# Patient Record
Sex: Female | Born: 1950 | Race: White | Hispanic: No | Marital: Single | Smoking: Never smoker
Health system: Southern US, Community
[De-identification: ages and names within clinical notes are randomized; demographics above are authoritative.]

## PROBLEM LIST (undated history)

## (undated) DIAGNOSIS — I352 Nonrheumatic aortic (valve) stenosis with insufficiency: Secondary | ICD-10-CM

## (undated) DIAGNOSIS — M1990 Unspecified osteoarthritis, unspecified site: Secondary | ICD-10-CM

## (undated) DIAGNOSIS — I471 Supraventricular tachycardia, unspecified: Secondary | ICD-10-CM

## (undated) DIAGNOSIS — I4891 Unspecified atrial fibrillation: Secondary | ICD-10-CM

## (undated) DIAGNOSIS — I5033 Acute on chronic diastolic (congestive) heart failure: Secondary | ICD-10-CM

## (undated) DIAGNOSIS — R238 Other skin changes: Secondary | ICD-10-CM

## (undated) DIAGNOSIS — I10 Essential (primary) hypertension: Secondary | ICD-10-CM

## (undated) DIAGNOSIS — I340 Nonrheumatic mitral (valve) insufficiency: Secondary | ICD-10-CM

## (undated) HISTORY — DX: Unspecified osteoarthritis, unspecified site: M19.90

## (undated) HISTORY — DX: Supraventricular tachycardia, unspecified: I47.10

## (undated) HISTORY — DX: Nonrheumatic mitral (valve) insufficiency: I34.0

## (undated) HISTORY — DX: Essential (primary) hypertension: I10

## (undated) HISTORY — DX: Supraventricular tachycardia: I47.1

## (undated) HISTORY — PX: OTHER SURGICAL HISTORY: SHX169

## (undated) HISTORY — DX: Nonrheumatic aortic (valve) stenosis with insufficiency: I35.2

## (undated) HISTORY — DX: Unspecified atrial fibrillation: I48.91

## (undated) HISTORY — PX: TONSILLECTOMY: SUR1361

---

## 2011-03-27 ENCOUNTER — Inpatient Hospital Stay (HOSPITAL_COMMUNITY)
Admission: AD | Admit: 2011-03-27 | Discharge: 2011-04-05 | DRG: 217 | Disposition: A | Discharge status: Home Health | Payer: Medicaid Other | Source: Other Acute Inpatient Hospital | Attending: Thoracic Surgery (Cardiothoracic Vascular Surgery) | Admitting: Thoracic Surgery (Cardiothoracic Vascular Surgery)

## 2011-03-27 DIAGNOSIS — D62 Acute posthemorrhagic anemia: Secondary | ICD-10-CM | POA: Diagnosis not present

## 2011-03-27 DIAGNOSIS — I252 Old myocardial infarction: Secondary | ICD-10-CM

## 2011-03-27 DIAGNOSIS — R7989 Other specified abnormal findings of blood chemistry: Secondary | ICD-10-CM

## 2011-03-27 DIAGNOSIS — I080 Rheumatic disorders of both mitral and aortic valves: Principal | ICD-10-CM | POA: Diagnosis present

## 2011-03-27 DIAGNOSIS — Y838 Other surgical procedures as the cause of abnormal reaction of the patient, or of later complication, without mention of misadventure at the time of the procedure: Secondary | ICD-10-CM | POA: Diagnosis not present

## 2011-03-27 DIAGNOSIS — R079 Chest pain, unspecified: Secondary | ICD-10-CM

## 2011-03-27 DIAGNOSIS — E8779 Other fluid overload: Secondary | ICD-10-CM | POA: Diagnosis not present

## 2011-03-27 DIAGNOSIS — Z8249 Family history of ischemic heart disease and other diseases of the circulatory system: Secondary | ICD-10-CM

## 2011-03-27 DIAGNOSIS — Y9210 Unspecified residential institution as the place of occurrence of the external cause: Secondary | ICD-10-CM | POA: Diagnosis not present

## 2011-03-27 DIAGNOSIS — I2789 Other specified pulmonary heart diseases: Secondary | ICD-10-CM | POA: Diagnosis present

## 2011-03-27 DIAGNOSIS — Z7982 Long term (current) use of aspirin: Secondary | ICD-10-CM

## 2011-03-27 DIAGNOSIS — R0602 Shortness of breath: Secondary | ICD-10-CM

## 2011-03-27 DIAGNOSIS — I519 Heart disease, unspecified: Secondary | ICD-10-CM | POA: Diagnosis not present

## 2011-03-27 DIAGNOSIS — E669 Obesity, unspecified: Secondary | ICD-10-CM | POA: Diagnosis present

## 2011-03-27 DIAGNOSIS — I10 Essential (primary) hypertension: Secondary | ICD-10-CM | POA: Diagnosis present

## 2011-03-27 DIAGNOSIS — Z79899 Other long term (current) drug therapy: Secondary | ICD-10-CM

## 2011-03-27 DIAGNOSIS — I4891 Unspecified atrial fibrillation: Secondary | ICD-10-CM | POA: Diagnosis not present

## 2011-03-27 DIAGNOSIS — Z91038 Other insect allergy status: Secondary | ICD-10-CM

## 2011-03-27 LAB — COMPREHENSIVE METABOLIC PANEL
AST: 22 U/L (ref 0–37)
Albumin: 3.6 g/dL (ref 3.5–5.2)
Alkaline Phosphatase: 75 U/L (ref 39–117)
BUN: 19 mg/dL (ref 6–23)
CO2: 27 mEq/L (ref 19–32)
Chloride: 100 mEq/L (ref 96–112)
Creatinine, Ser: 0.85 mg/dL (ref 0.50–1.10)
GFR calc non Af Amer: 74 mL/min — ABNORMAL LOW (ref >90)
Potassium: 3.7 mEq/L (ref 3.5–5.1)
Total Bilirubin: 0.7 mg/dL (ref 0.3–1.2)

## 2011-03-27 LAB — CBC
HCT: 33.1 % — ABNORMAL LOW (ref 36.0–46.0)
MCV: 82.3 fL (ref 78.0–100.0)
RBC: 4.02 MIL/uL (ref 3.87–5.11)
RDW: 13.8 % (ref 11.5–15.5)
WBC: 5.9 10*3/uL (ref 4.0–10.5)

## 2011-03-27 LAB — DIFFERENTIAL
Basophils Absolute: 0 10*3/uL (ref 0.0–0.1)
Eosinophils Relative: 2 % (ref 0–5)
Lymphocytes Relative: 25 % (ref 12–46)
Lymphs Abs: 1.5 10*3/uL (ref 0.7–4.0)
Neutro Abs: 4 10*3/uL (ref 1.7–7.7)
Neutrophils Relative %: 67 % (ref 43–77)

## 2011-03-27 LAB — PROTIME-INR: INR: 1.12 (ref 0.00–1.49)

## 2011-03-27 LAB — TSH: TSH: 0.82 u[IU]/mL (ref 0.350–4.500)

## 2011-03-27 LAB — CARDIAC PANEL(CRET KIN+CKTOT+MB+TROPI): Relative Index: INVALID (ref 0.0–2.5)

## 2011-03-27 LAB — PRO B NATRIURETIC PEPTIDE: Pro B Natriuretic peptide (BNP): 3675 pg/mL — ABNORMAL HIGH (ref 0–125)

## 2011-03-27 LAB — MRSA PCR SCREENING: MRSA by PCR: POSITIVE — AB

## 2011-03-28 ENCOUNTER — Inpatient Hospital Stay (HOSPITAL_COMMUNITY): Payer: Medicaid Other

## 2011-03-28 DIAGNOSIS — I359 Nonrheumatic aortic valve disorder, unspecified: Secondary | ICD-10-CM

## 2011-03-28 DIAGNOSIS — I059 Rheumatic mitral valve disease, unspecified: Secondary | ICD-10-CM

## 2011-03-28 DIAGNOSIS — Z01810 Encounter for preprocedural cardiovascular examination: Secondary | ICD-10-CM

## 2011-03-28 LAB — CBC
MCHC: 32.9 g/dL (ref 30.0–36.0)
Platelets: 280 10*3/uL (ref 150–400)
RDW: 13.8 % (ref 11.5–15.5)
WBC: 5.1 10*3/uL (ref 4.0–10.5)

## 2011-03-28 LAB — POCT I-STAT 3, VENOUS BLOOD GAS (G3P V)
Acid-Base Excess: 2 mmol/L (ref 0.0–2.0)
Bicarbonate: 27.2 mEq/L — ABNORMAL HIGH (ref 20.0–24.0)
O2 Saturation: 57 %
TCO2: 29 mmol/L (ref 0–100)
pO2, Ven: 31 mmHg (ref 30.0–45.0)

## 2011-03-28 LAB — POCT I-STAT 3, ART BLOOD GAS (G3+)
Acid-Base Excess: 2 mmol/L (ref 0.0–2.0)
O2 Saturation: 91 %
TCO2: 28 mmol/L (ref 0–100)
pCO2 arterial: 42.1 mmHg (ref 35.0–45.0)

## 2011-03-28 LAB — DIFFERENTIAL
Basophils Absolute: 0 10*3/uL (ref 0.0–0.1)
Basophils Relative: 1 % (ref 0–1)
Eosinophils Absolute: 0.2 10*3/uL (ref 0.0–0.7)
Eosinophils Relative: 4 % (ref 0–5)
Monocytes Absolute: 0.5 10*3/uL (ref 0.1–1.0)

## 2011-03-28 LAB — PULMONARY FUNCTION TEST

## 2011-03-28 LAB — CARDIAC PANEL(CRET KIN+CKTOT+MB+TROPI): Total CK: 99 U/L (ref 7–177)

## 2011-03-28 LAB — HEPARIN LEVEL (UNFRACTIONATED)
Heparin Unfractionated: <0.1 IU/mL — ABNORMAL LOW (ref 0.30–0.70)
Heparin Unfractionated: 0.23 IU/mL — ABNORMAL LOW (ref 0.30–0.70)

## 2011-03-28 LAB — POCT ACTIVATED CLOTTING TIME: Activated Clotting Time: 111 seconds

## 2011-03-29 DIAGNOSIS — K08109 Complete loss of teeth, unspecified cause, unspecified class: Secondary | ICD-10-CM

## 2011-03-29 DIAGNOSIS — M2607 Excessive tuberosity of jaw: Secondary | ICD-10-CM

## 2011-03-29 LAB — CBC
HCT: 34.1 % — ABNORMAL LOW (ref 36.0–46.0)
Hemoglobin: 11 g/dL — ABNORMAL LOW (ref 12.0–15.0)
MCH: 27.2 pg (ref 26.0–34.0)
MCHC: 32.3 g/dL (ref 30.0–36.0)

## 2011-03-29 LAB — URINALYSIS, ROUTINE W REFLEX MICROSCOPIC
Glucose, UA: NEGATIVE mg/dL
Protein, ur: NEGATIVE mg/dL
Specific Gravity, Urine: 1.044 — ABNORMAL HIGH (ref 1.005–1.030)
pH: 6 (ref 5.0–8.0)

## 2011-03-29 LAB — BLOOD GAS, ARTERIAL
Acid-Base Excess: 3.8 mmol/L — ABNORMAL HIGH (ref 0.0–2.0)
O2 Saturation: 98.9 %
TCO2: 29.2 mmol/L (ref 0–100)
pCO2 arterial: 42.8 mmHg (ref 35.0–45.0)

## 2011-03-29 LAB — BASIC METABOLIC PANEL
Calcium: 9.1 mg/dL (ref 8.4–10.5)
GFR calc Af Amer: >90 mL/min (ref >90)
GFR calc non Af Amer: 80 mL/min — ABNORMAL LOW (ref >90)
Glucose, Bld: 98 mg/dL (ref 70–99)
Sodium: 141 mEq/L (ref 135–145)

## 2011-03-29 LAB — HEMOGLOBIN A1C: Mean Plasma Glucose: 117 mg/dL — ABNORMAL HIGH (ref <117)

## 2011-03-29 LAB — URINE MICROSCOPIC-ADD ON

## 2011-03-30 ENCOUNTER — Other Ambulatory Visit: Payer: Self-pay | Admitting: Thoracic Surgery (Cardiothoracic Vascular Surgery)

## 2011-03-30 ENCOUNTER — Inpatient Hospital Stay (HOSPITAL_COMMUNITY): Payer: Medicaid Other

## 2011-03-30 DIAGNOSIS — I359 Nonrheumatic aortic valve disorder, unspecified: Secondary | ICD-10-CM

## 2011-03-30 DIAGNOSIS — Z952 Presence of prosthetic heart valve: Secondary | ICD-10-CM | POA: Insufficient documentation

## 2011-03-30 HISTORY — PX: AORTIC VALVE REPLACEMENT: SHX41

## 2011-03-30 LAB — POCT I-STAT 3, ART BLOOD GAS (G3+)
Bicarbonate: 24.9 mEq/L — ABNORMAL HIGH (ref 20.0–24.0)
O2 Saturation: 100 %
O2 Saturation: 80 %
Patient temperature: 35.3
TCO2: 25 mmol/L (ref 0–100)
TCO2: 28 mmol/L (ref 0–100)
pCO2 arterial: 45.6 mmHg — ABNORMAL HIGH (ref 35.0–45.0)
pCO2 arterial: 35.3 mmHg (ref 35.0–45.0)
pCO2 arterial: 35.4 mmHg (ref 35.0–45.0)
pH, Arterial: 7.427 — ABNORMAL HIGH (ref 7.350–7.400)
pH, Arterial: 7.376 (ref 7.350–7.400)
pH, Arterial: 7.326 — ABNORMAL LOW (ref 7.350–7.400)
pO2, Arterial: 350 mmHg — ABNORMAL HIGH (ref 80.0–100.0)

## 2011-03-30 LAB — CBC
HCT: 34.3 % — ABNORMAL LOW (ref 36.0–46.0)
Hemoglobin: 11 g/dL — ABNORMAL LOW (ref 12.0–15.0)
Hemoglobin: 8.1 g/dL — ABNORMAL LOW (ref 12.0–15.0)
MCH: 26.8 pg (ref 26.0–34.0)
MCH: 27.5 pg (ref 26.0–34.0)
MCHC: 32.1 g/dL (ref 30.0–36.0)
Platelets: 142 10*3/uL — ABNORMAL LOW (ref 150–400)
Platelets: 158 10*3/uL (ref 150–400)
RBC: 2.97 MIL/uL — ABNORMAL LOW (ref 3.87–5.11)
RBC: 3.45 MIL/uL — ABNORMAL LOW (ref 3.87–5.11)
WBC: 6 10*3/uL (ref 4.0–10.5)
WBC: 8 10*3/uL (ref 4.0–10.5)

## 2011-03-30 LAB — POCT I-STAT 4, (NA,K, GLUC, HGB,HCT)
Glucose, Bld: 102 mg/dL — ABNORMAL HIGH (ref 70–99)
Glucose, Bld: 129 mg/dL — ABNORMAL HIGH (ref 70–99)
Glucose, Bld: 124 mg/dL — ABNORMAL HIGH (ref 70–99)
Glucose, Bld: 97 mg/dL (ref 70–99)
HCT: 32 % — ABNORMAL LOW (ref 36.0–46.0)
HCT: 25 % — ABNORMAL LOW (ref 36.0–46.0)
HCT: 28 % — ABNORMAL LOW (ref 36.0–46.0)
HCT: 23 % — ABNORMAL LOW (ref 36.0–46.0)
HCT: 26 % — ABNORMAL LOW (ref 36.0–46.0)
Hemoglobin: 9.5 g/dL — ABNORMAL LOW (ref 12.0–15.0)
Hemoglobin: 7.8 g/dL — ABNORMAL LOW (ref 12.0–15.0)
Hemoglobin: 8.8 g/dL — ABNORMAL LOW (ref 12.0–15.0)
Potassium: 4.1 mEq/L (ref 3.5–5.1)
Potassium: 5.1 mEq/L (ref 3.5–5.1)
Potassium: 4 mEq/L (ref 3.5–5.1)
Sodium: 137 mEq/L (ref 135–145)
Sodium: 137 mEq/L (ref 135–145)

## 2011-03-30 LAB — APTT: aPTT: 36 seconds (ref 24–37)

## 2011-03-30 LAB — DIFFERENTIAL
Basophils Relative: 1 % (ref 0–1)
Monocytes Absolute: 0.3 10*3/uL (ref 0.1–1.0)
Monocytes Relative: 6 % (ref 3–12)
Neutro Abs: 3.4 10*3/uL (ref 1.7–7.7)

## 2011-03-30 LAB — BASIC METABOLIC PANEL
BUN: 24 mg/dL — ABNORMAL HIGH (ref 6–23)
Calcium: 9.3 mg/dL (ref 8.4–10.5)
GFR calc non Af Amer: 75 mL/min — ABNORMAL LOW (ref >90)
Glucose, Bld: 100 mg/dL — ABNORMAL HIGH (ref 70–99)
Sodium: 137 mEq/L (ref 135–145)

## 2011-03-30 LAB — CREATININE, SERUM
Creatinine, Ser: 0.64 mg/dL (ref 0.50–1.10)
GFR calc Af Amer: >90 mL/min (ref >90)
GFR calc non Af Amer: >90 mL/min (ref >90)

## 2011-03-30 LAB — PROTIME-INR
INR: 1.57 — ABNORMAL HIGH (ref 0.00–1.49)
Prothrombin Time: 19.1 seconds — ABNORMAL HIGH (ref 11.6–15.2)

## 2011-03-30 LAB — PREPARE RBC (CROSSMATCH)

## 2011-03-30 LAB — MAGNESIUM: Magnesium: 3.1 mg/dL — ABNORMAL HIGH (ref 1.5–2.5)

## 2011-03-30 NOTE — Consult Note (Signed)
NAMEKEVIONNA, HEFFLER               ACCOUNT NO.:  [id number]  MEDICAL RECORD NO.:  [id number]  LOCATION:                                 FACILITY:  PHYSICIAN:  Salvatore Decent. Cornelius Moras, M.D. DATE OF BIRTH:  Jun 08, 1951  DATE OF CONSULTATION:  03/28/2011 DATE OF DISCHARGE:                                CONSULTATION   REQUESTING PHYSICIAN:  Dr. Peter Swaziland.  REASON FOR CONSULTATION:  Severe aortic stenosis and aortic insufficiency with mitral regurgitation.  HISTORY OF PRESENT ILLNESS:  The patient is a 60 year old obese white female, who recently moved to [facility], [place].  The patient has known history of valvular heart disease dating back 23 years ago when she reportedly suffered a myocardial infarction.  At that time, she lived in [place].  She was noted to have a heart murmur on exam and was told that she had valvular heart disease.  Since then, she has had followup in a variety different locations with intermittent echocardiograms.  Over the years, she has been told that she may ultimately need surgical intervention.  Her past medical history is, otherwise, notable for history of hypertension and some type of tachycardia that is not clearly defined.  According to the patient's mother, approximately 1 week ago, she began to experience more worsening symptoms of shortness of breath.  She then developed fairly acute onset of tachycardia associated with substernal chest discomfort.  She was seen in the emergency department at [facility] where troponin levels were abnormal prompting transfer to [facility] for further management.  She did have an echocardiogram performed in [facility] demonstrating the presence of moderate to severe aortic stenosis with moderate aortic insufficiency and at least mild mitral regurgitation.  She was admitted to the hospital here and underwent left and right heart catheterization earlier today by Dr. Swaziland.  She was found to  have severe aortic stenosis with moderate-to-severe pulmonary hypertension.  The patient does not have any coronary artery disease. Cardiothoracic surgical consultation was requested.  REVIEW OF SYSTEMS:  The patient is a poor historian.  Review of systems is partly through the patient as well as partly through telephone interview with her mother who has been caring for her all of her life. The patient reportedly has a normal appetite and has been the eating fine.  She lives a very sedentary lifestyle.  Her mobility is limited by arthritis afflicting her hips and knees.  The patient has longstanding exertional shortness of breath, which has been considerably worse over the past week.  The patient denies productive cough, hemoptysis, wheezing.  She has no difficulty swallowing.  Bowel function is regular. She and her family deny any known symptoms suggestive of previous TIA or stroke.  She has not seen a dentist in some time.  The remainder of her review of systems is noncontributory.  PAST MEDICAL HISTORY: 1. Aortic stenosis and aortic insufficiency. 2. Mitral regurgitation. 3. Hypertension. 4. Tachycardia. 5. Arthritis.  PAST SURGICAL HISTORY:  Hysterectomy.  SOCIAL HISTORY:  The patient is single and has never been married.  She lives with her mother who has cared for her all of her life.  She has limited  cognitive ability.  She has never been a smoker.  She does not use alcohol.  She lives a sedentary lifestyle.  FAMILY HISTORY:  Noncontributory.  MEDICATIONS PRIOR TO ADMISSION: 1. Diovan 320 mg daily. 2. Verapamil 240 mg daily. 3. Claritin-D 10 mg daily. 4. Lasix 40 mg twice daily. 5. Fluoxetine 40 mg daily.  DRUG ALLERGIES:  None known.  PHYSICAL EXAMINATION:  GENERAL:  The patient is an obese white female, appears her stated age, in no acute distress. HEENT:  Essentially unrevealing. NECK:  Supple.  There is no cervical nor supraclavicular lymphadenopathy.  There  is no jugular venous distention.  There are no carotid bruits. CHEST:  Auscultation of the chest reveals clear breath sounds anteriorly.  No wheezes, rales, or rhonchi are noted. CARDIOVASCULAR:  Notable for regular rate and rhythm.  There is a prominent grade 3/6 crescendo-decrescendo systolic murmur heard best along the sternal border.  Diastolic murmur is also appreciated. ABDOMEN:  Obese but soft, nontender.  Bowel sounds are present. EXTREMITIES:  Warm, well perfused.  There is mild bilateral lower extremity edema.  Distal pulses are not palpable at the ankle. SKIN:  Clean, dry, healthy appearing throughout. RECTAL AND GU:  Both deferred. NEUROLOGIC:  Grossly nonfocal.  DIAGNOSTIC TESTS:  Left and right heart catheterization performed by Dr. Swaziland earlier today is reviewed.  This confirms the presence of severe aortic stenosis.  The mean gradient across the aortic valve was 88 mmHg. The aortic valve area was estimated between 0.66 and 0.78 cm sq.  The mean gradient across the aortic valve was 42 mmHg.  Pulmonary artery pressure was measured at 67/23 with pulmonary capillary wedge pressure of 28.  Central venous pressure was 12.  There was no gradient across the mitral valve.  There was moderate aortic insufficiency with mild aortic root dilatation.  There is normal left ventricular systolic function with mild mitral regurgitation.  Cardiac output ranged between 4.9 and 5.8 L/minute.  Coronary artery anatomy was normal with no significant coronary artery disease.  Report of echocardiogram performed at [facility], [place], [facility] is reviewed.  By report, there was moderate-to-severe aortic stenosis with mean gradient across the aortic valve estimated 28 mmHg.  There was moderate aortic insufficiency with mild mitral regurgitation.  There was moderate-to-severe left atrial enlargement.  There is moderate pulmonary hypertension.  Left ventricular  systolic function was normal.  There was moderate left ventricular hypertrophy with diastolic dysfunction.  IMPRESSION:  Severe aortic stenosis with moderate aortic insufficiency and at least mild mitral regurgitation.  The patient has had longstanding symptoms which have acutely become exacerbated over the past week, culminating in acute hospitalization with elevated cardiac enzymes and resting shortness of breath.  I agree that she needs aortic valve replacement.  PLAN:  I have discussed matters at length with the patient and over the telephone with her mother and brother-in-law.  The rationale for surgical intervention has been discussed.  We will tentatively plan to proceed with surgery later this week.  The family should be here tomorrow for further consultation and all their questions will be addressed.     Salvatore Decent. Cornelius Moras, M.D.     CHO/MEDQ  D:  03/28/2011  T:  03/29/2011  Job:  [id number]  Electronically Signed by Tressie Stalker M.D. on 03/30/2011 12:54:46 AM

## 2011-03-31 ENCOUNTER — Inpatient Hospital Stay (HOSPITAL_COMMUNITY): Payer: Medicaid Other

## 2011-03-31 LAB — GLUCOSE, CAPILLARY
Glucose-Capillary: 139 mg/dL — ABNORMAL HIGH (ref 70–99)
Glucose-Capillary: 144 mg/dL — ABNORMAL HIGH (ref 70–99)
Glucose-Capillary: 150 mg/dL — ABNORMAL HIGH (ref 70–99)
Glucose-Capillary: 176 mg/dL — ABNORMAL HIGH (ref 70–99)
Glucose-Capillary: 94 mg/dL (ref 70–99)

## 2011-03-31 LAB — MAGNESIUM
Magnesium: 2.7 mg/dL — ABNORMAL HIGH (ref 1.5–2.5)
Magnesium: 2.4 mg/dL (ref 1.5–2.5)

## 2011-03-31 LAB — POCT I-STAT 3, ART BLOOD GAS (G3+)
O2 Saturation: 96 %
Patient temperature: 97
TCO2: 29 mmol/L (ref 0–100)
pH, Arterial: 7.36 (ref 7.350–7.400)

## 2011-03-31 LAB — POCT I-STAT, CHEM 8
BUN: 18 mg/dL (ref 6–23)
Calcium, Ion: 1.18 mmol/L (ref 1.12–1.32)
HCT: 28 % — ABNORMAL LOW (ref 36.0–46.0)
Hemoglobin: 9.5 g/dL — ABNORMAL LOW (ref 12.0–15.0)
Potassium: 3.9 mEq/L (ref 3.5–5.1)
Sodium: 138 mEq/L (ref 135–145)
TCO2: 24 mmol/L (ref 0–100)

## 2011-03-31 LAB — BASIC METABOLIC PANEL
CO2: 26 mEq/L (ref 19–32)
Chloride: 107 mEq/L (ref 96–112)
Potassium: 4.4 mEq/L (ref 3.5–5.1)
Sodium: 139 mEq/L (ref 135–145)

## 2011-03-31 LAB — TYPE AND SCREEN
ABO/RH(D): B POS
Antibody Screen: NEGATIVE

## 2011-03-31 LAB — CREATININE, SERUM
Creatinine, Ser: 0.78 mg/dL (ref 0.50–1.10)
GFR calc non Af Amer: >90 mL/min (ref >90)

## 2011-03-31 LAB — CBC
Hemoglobin: 9.9 g/dL — ABNORMAL LOW (ref 12.0–15.0)
MCHC: 32.1 g/dL (ref 30.0–36.0)
Platelets: 178 10*3/uL (ref 150–400)
Platelets: 188 10*3/uL (ref 150–400)
RBC: 3.68 MIL/uL — ABNORMAL LOW (ref 3.87–5.11)
RBC: 3.61 MIL/uL — ABNORMAL LOW (ref 3.87–5.11)
WBC: 10.4 10*3/uL (ref 4.0–10.5)

## 2011-03-31 NOTE — Consult Note (Signed)
Jennifer Bauer               ACCOUNT NO.:  [id number]  MEDICAL RECORD NO.:  [id number]  LOCATION:  2925                         FACILITY:  [facility]  PHYSICIAN:  Jennifer Bauer, D.D.S.DATE OF BIRTH:  1950-08-09  DATE OF CONSULTATION:  03/29/2011 DATE OF DISCHARGE:                                CONSULTATION   Jennifer Bauer is a 60 year old female referred by Dr. Tressie Bauer for dental consultation.  The patient with recent identification of severe aortic stenosis with anticipated aortic valve replacement.  The patient now seen as part of preaortic valve replacement protocol to rule out dental infection that may affect the patient's systemic health and anticipated heart valve surgery.  MEDICAL HISTORY: 1. Moderate to severe aortic stenosis.     a.     Status post catheterization on March 28, 2011 which      revealed aortic valve area of 0.66 cm squared, left ventricular      hypertrophy, ejection fraction 55%, normal coronary arteries, and severe aortic stenosis with moderate aortic insufficiency.     b.     Anticipated aortic valve replacement with Dr. Cornelius Bauer. 2. Hypertension. 3. History of tachycardia. 4. Arthritis primarily affecting hips and knees. 5. Status post hysterectomy.  ALLERGIES/ADVERSE DRUG REACTIONS: 1. BEE STINGS. 2. ACETAMINOPHEN.  MEDICATIONS: 1. Amicar 1000 mg IV every 24 hours. 2. Aspirin 81 mg daily. 3. Cefuroxime IV antibiotic therapy per protocol. 4. Chlorhexidine rinses twice daily. 5. Precedex 400 mg IV every 24 hours. 6. Dopamine IV every 24 hours per protocol. 7. Epinephrine IV per protocol. 8. Prozac 40 mg daily. 9. Lasix 40 mg twice daily. 10.Heparin 5000 mg subcutaneously every 8 hours.  SOCIAL HISTORY:  The patient is single and has never been married.  The patient lives with her mother.  The patient has some limited cognitive ability.  The patient is a nonsmoker and nondrinker.  FAMILY HISTORY:  Positive for coronary artery disease  in the father.  REVIEW OF SYSTEMS:  This is reviewed from the chart for this admission.  CHIEF COMPLAINT:  Dental consultation requested for a pre heart valve surgery, dental evaluation.  HISTORY OF PRESENT ILLNESS:  The patient is identified to have severe aortic stenosis with anticipated aortic valve replacement with Dr. Cornelius Bauer. The patient now seen as part of pre heart valve surgery, dental protocol evaluation.  The patient currently denies acute dental problems.  The patient indicates that she is edentulous.  The patient indicates that she has been wearing upper and lower complete dentures "for a while."  The patient denies having any problems with her dentures.  The patient indicates that they "fit fine."  The patient again has not seen her primary dentist for "a while.  DENTAL EXAMINATION:   GENERAL:  The patient is a well-developed, well- nourished female in no acute distress. HEAD AND NECK:  There is no obvious lymphadenopathy.  The patient denies acute TMJ symptoms. INTRAORAL:  The patient with normal saliva.  The patient is edentulous. I do not see any evidence of denture irritation or erythema.  The patient may have excessive maxillary right and left tuberosities but does not appear to be of clinical significance since the patient does  have adequate dentures. PROSTHODONTIC:  The patient with upper and lower complete dentures. These appeared to be clinically acceptable and are stable and retentive at this time. OCCLUSION:  The occlusion of the upper and lower complete dentures appears to be adequate at this time.  RADIOGRAPH INTERPRETATION:  Panoramic x-ray was taken on March 28, 2011.  The patient is edentulous.  There is no evidence of impacted teeth. There is no evidence of retained root segments.  The patient does have some atrophy of the edentulous alveolar ridges.  ASSESSMENT: 1. The patient is edentulous. 2. The patient has no retained root tips or impacted  teeth. 3. The patient has a clinically acceptable upper and lower complete     denture. 4. The patient has possible excessive maxillary left and right     tuberosities with possible need for future reduction prior to     fabrication of a new upper and lower complete denture if the     patient so desires.  PLAN/RECOMMENDATIONS: 1. I discussed the risks, benefits, and complications of various     treatment options with the patient in relationship to her medical     and dental conditions and anticipated heart valve surgery and the     potential outcomes of possible treatment options for the patient.     We discussed various treatment options to include no treatment,     refabrication of new upper and lower complete dentures, and     possible referral for implant therapy if so indicated.  The patient     currently is not interested in having any other dental treatment at     this time.  The patient is not complaining of any problems with her     dentures and therefore I will defer any dental treatment at this     time and proceed with aortic valve replacement with Dr. Cornelius Bauer as     indicated.  The patient will contact her     primary dentist if further evaluation for dental treatment options     are desired. 2. The patient is cleared for aortic valve replacement with Dr. Cornelius Bauer     per his final decision to proceed as indicated.          ______________________________ Jennifer Bauer, D.D.S.     RK/MEDQ  D:  03/29/2011  T:  03/29/2011  Job:  [id number]  cc:   Jennifer Bauer. Jennifer Som, MD, [facility] Jennifer Bauer. Jennifer Bauer, M.D.  Electronically Signed by Jennifer Bauer D.D.S. on 03/31/2011 01:04:01 PM

## 2011-04-01 ENCOUNTER — Inpatient Hospital Stay (HOSPITAL_COMMUNITY): Payer: Medicaid Other

## 2011-04-01 LAB — CBC
HCT: 29.8 % — ABNORMAL LOW (ref 36.0–46.0)
Hemoglobin: 9.7 g/dL — ABNORMAL LOW (ref 12.0–15.0)
MCH: 27.8 pg (ref 26.0–34.0)
MCHC: 32.6 g/dL (ref 30.0–36.0)
MCV: 85.4 fL (ref 78.0–100.0)
Platelets: 168 10*3/uL (ref 150–400)
RBC: 3.49 MIL/uL — ABNORMAL LOW (ref 3.87–5.11)
RDW: 14.5 % (ref 11.5–15.5)
WBC: 8.6 10*3/uL (ref 4.0–10.5)

## 2011-04-01 LAB — GLUCOSE, CAPILLARY
Glucose-Capillary: 123 mg/dL — ABNORMAL HIGH (ref 70–99)
Glucose-Capillary: 123 mg/dL — ABNORMAL HIGH (ref 70–99)
Glucose-Capillary: 119 mg/dL — ABNORMAL HIGH (ref 70–99)

## 2011-04-01 LAB — COMPREHENSIVE METABOLIC PANEL
ALT: 19 U/L (ref 0–35)
AST: 28 U/L (ref 0–37)
Albumin: 2.9 g/dL — ABNORMAL LOW (ref 3.5–5.2)
Alkaline Phosphatase: 51 U/L (ref 39–117)
BUN: 21 mg/dL (ref 6–23)
CO2: 29 mEq/L (ref 19–32)
Calcium: 8 mg/dL — ABNORMAL LOW (ref 8.4–10.5)
Chloride: 103 mEq/L (ref 96–112)
Creatinine, Ser: 0.79 mg/dL (ref 0.50–1.10)
GFR calc Af Amer: >90 mL/min (ref >90)
GFR calc non Af Amer: >90 mL/min (ref >90)
Glucose, Bld: 116 mg/dL — ABNORMAL HIGH (ref 70–99)
Potassium: 3.7 mEq/L (ref 3.5–5.1)
Sodium: 137 mEq/L (ref 135–145)
Total Bilirubin: 0.7 mg/dL (ref 0.3–1.2)
Total Protein: 5.5 g/dL — ABNORMAL LOW (ref 6.0–8.3)

## 2011-04-02 ENCOUNTER — Inpatient Hospital Stay (HOSPITAL_COMMUNITY): Payer: Medicaid Other

## 2011-04-02 LAB — BASIC METABOLIC PANEL
BUN: 22 mg/dL (ref 6–23)
CO2: 28 mEq/L (ref 19–32)
Calcium: 8.4 mg/dL (ref 8.4–10.5)
Chloride: 97 mEq/L (ref 96–112)
Creatinine, Ser: 0.68 mg/dL (ref 0.50–1.10)
GFR calc Af Amer: >90 mL/min (ref >90)
GFR calc non Af Amer: >90 mL/min (ref >90)
Glucose, Bld: 116 mg/dL — ABNORMAL HIGH (ref 70–99)
Potassium: 3.9 mEq/L (ref 3.5–5.1)
Sodium: 133 mEq/L — ABNORMAL LOW (ref 135–145)

## 2011-04-02 LAB — CBC
HCT: 30.6 % — ABNORMAL LOW (ref 36.0–46.0)
Hemoglobin: 9.9 g/dL — ABNORMAL LOW (ref 12.0–15.0)
MCH: 27.5 pg (ref 26.0–34.0)
MCHC: 32.4 g/dL (ref 30.0–36.0)
MCV: 85 fL (ref 78.0–100.0)
Platelets: 194 10*3/uL (ref 150–400)
RBC: 3.6 MIL/uL — ABNORMAL LOW (ref 3.87–5.11)
RDW: 14.5 % (ref 11.5–15.5)
WBC: 8.6 10*3/uL (ref 4.0–10.5)

## 2011-04-02 LAB — GLUCOSE, CAPILLARY
Glucose-Capillary: 174 mg/dL — ABNORMAL HIGH (ref 70–99)
Glucose-Capillary: 104 mg/dL — ABNORMAL HIGH (ref 70–99)

## 2011-04-02 NOTE — Op Note (Signed)
NAMEDORTHIA, TOUT               ACCOUNT NO.:  [id number]  MEDICAL RECORD NO.:  [id number]  LOCATION:  2304                         FACILITY:  [facility]  PHYSICIAN:  Salvatore Decent. Cornelius Moras, M.D. DATE OF BIRTH:  [birth year]  DATE OF PROCEDURE:  03/30/2011 DATE OF DISCHARGE:                              OPERATIVE REPORT   PREOPERATIVE DIAGNOSES:  Severe aortic stenosis and moderate aortic insufficiency.  POSTOPERATIVE DIAGNOSES:  Severe aortic stenosis and moderate aortic insufficiency.  PROCEDURE:  Median sternotomy for aortic valve replacement (21-mm [facility] ease pericardial tissue valve.  SURGEON:  Salvatore Decent. Cornelius Moras, MD  ASSISTANT:  Kerin Perna, MD  ANESTHESIA:  Achille Rich, MD  BRIEF CLINICAL NOTE:  The patient is a 60 year old obese white female with longstanding history of valvular heart disease.  This was discovered more than 20 years ago when at that time, the patient lived in [place].  She has been followed intermittently with serial echocardiograms.  She recently moved to [facility], [place].  She and her family described a several month history of worsening symptoms of shortness of breath, followed by an acute exacerbation of shortness of breath, chest discomfort and tachycardia for which she initially presented to [facility].  Troponin levels were elevated.  The patient was transferred to [facility].  Echocardiogram revealed severe aortic stenosis with moderate aortic insufficiency and mild mitral regurgitation with normal left ventricular function.  The patient underwent left and right heart catheterization, confirming the presence of severe aortic stenosis.  There was no significant coronary artery disease.  A full consultation note has been dictated previously.  The patient and her family have been counseled regarding the indications, risks, and potential benefits of surgery.  Alternative treatment strategies have been discussed.   Alternative surgical strategies have been discussed and in particular discussion has been had with respect to replacement of the aortic valve and which type of prosthesis to use. Because of the patient's cognitive ability and the likely difficulty with long-term medical followup, the patient is felt to be a relatively poor candidate for long-term anticoagulation with Coumadin.  The patient and her family understand that use of a bioprosthetic tissue valve comes with some risk for late structural valve deterioration and failure. They understand and accept all associated risks of surgery including, but not limited to risk of death, stroke, myocardial infarction, congestive heart failure, respiratory failure, renal failure, bleeding requiring blood transfusion, arrhythmia, infection, and late complications related to valve replacement.  All their questions have been addressed.  OPERATIVE FINDINGS: 1. Severe aortic stenosis with moderate aortic insufficiency. 2. Normal left ventricular systolic function with moderate left     ventricular hypertrophy 3. Mild mitral regurgitation.  OPERATIVE PROCEDURE IN DETAIL: The patient was brought to the operating room on the above-mentioned date and central monitoring was established by the anesthesia team under the care and direction of Dr. Achille Rich.  Specifically, a Swan-Ganz catheter was placed through the right internal jugular approach.  A radial arterial line was placed.  Intravenous antibiotics were administered.  The patient was placed in the supine position on the operating table.  General endotracheal anesthesia was induced uneventfully.  A Foley catheter  was placed.  The patient's neck, chest, abdomen, both groins and both lower extremities were prepared and draped in a sterile manner.  Baseline transesophageal echocardiogram was performed by Dr. Chaney Malling. This confirmed the presence of severe calcific aortic stenosis with moderate  aortic insufficiency.  Left ventricular systolic function appears normal.  There was moderate left ventricular hypertrophy.  There was only mild (1+) mitral regurgitation.  There are no functional abnormalities of any significance with the mitral valve and the jet of regurgitation was central and consistent with type 1 dysfunction.  No other significant abnormalities are noted.  The median sternotomy incision was performed.  The patient was heparinized systemically.  The ascending aorta and the right atrium were cannulated for cardiopulmonary bypass.  The retrograde cardioplegic cannula was placed through the right atrium into the coronary sinus. Cardiopulmonary bypass was begun.  The left ventricular vent was placed through the right superior pulmonary vein.  An antegrade cardioplegic cannula was placed low in the aortic root.  The patient was allowed to cool passively to 32 degree systemic temperature.  The aortic cross-clamp was applied and cold blood cardioplegia was delivered initially through the aortic root. Supplemental cardioplegia was administered retrograde through the coronary sinus catheter.  The initial cardioplegic arrest was rapid with early diastolic arrest.  Repeat doses of cardioplegia were administered intermittently throughout the entire crossclamp portion of the operation every 20 minutes retrograde through the coronary sinus catheter to maintain completely flat electrocardiogram.  The sternal field was flooded with carbon dioxide.  An oblique transverse aortotomy incision was performed.  The aortic valve was inspected.  The aortic valve was tricuspid, but all 3 leaflets are heavily calcified.  There was severe aortic stenosis.  The leaflets do not meet in the middle.  There was severe annular calcification.  The aortic valve was excised sharply.  The aortic anulus was decalcified. This was somewhat tedious due to the amount of calcification which also extends  down into the anterior leaflet of the mitral valve.  Once adequate, the calcification has been completed, the aortic root was sized to accept a 21-mm stented bioprosthetic tissue valve.  The aortic root and left ventricular chamber are irrigated with copious saline solution.  The aortic valve was replaced using 2-0 Ethibond horizontal mattress pledgeted sutures with pledgets in the subannular position.  An [facility] ease pericardial tissue valve (size 21-mm, model #3300, TFX, serial [id number]) is secured in place uneventfully.  The valve seats nicely and without difficulty.  There appears to be adequate room between the sewing ring and the adjacent left main coronary artery and the right coronary artery.  Rewarming has begun.  The aortotomy was closed using a two-layer closure of running 4-0 Prolene suture with Teflon felt strips to buttress the suture line. Needle was placed in the aortic root to serve as a root vent.  The lungs are ventilated and the heart allowed to fill while 1 final dose of warm retrograde hot shot cardioplegia was administered.  The aortic crossclamp was removed after a total cross-clamp time of 93 minutes. The heart was cardioverted and ultimately paced rhythm resumes. Epicardial pacing wires were fixed to the right ventricular free wall into the right atrial appendage.  The lungs were ventilated and the heart allowed to fill after which time the left ventricular vent and the aortic root vent were both removed.  The patient was weaned from cardiopulmonary bypass without difficulty. The patient's rhythm at separation from bypass was AV paced.  No inotropic  support was required.  Total cardiopulmonary bypass time for the operation was 119 minutes.  The venous and arterial cannulae are removed uneventfully.  Protamine was administered to reverse the anticoagulation.  The mediastinum was irrigated with saline solution and inspected for meticulous  surgical hemostasis.  The mediastinum was drained using 2 chest tubes placed through separate stab incisions inferiorly.  The pericardium and soft tissues anterior to the aorta were reapproximated loosely.  The sternum was closed using double-strength sternal wire.  The soft tissues anterior to the sternum were closed in multiple layers and the skin was closed with a running subcuticular skin closure.  The patient tolerated the procedure well and was transported to the Surgical Intensive Care Unit in stable condition.  There were no intraoperative complications.  All sponge, instrument, and needle counts were verified and correct at completion of the operation.  No blood products were administered.     Salvatore Decent. Cornelius Moras, M.D.     CHO/MEDQ  D:  03/30/2011  T:  03/30/2011  Job:  [id number]  cc:   Vesta Mixer, M.D. Peter M. Swaziland, M.D.  Electronically Signed by Tressie Stalker M.D. on 04/02/2011 07:19:59 AM

## 2011-04-03 LAB — PROTIME-INR
INR: 1.07 (ref 0.00–1.49)
Prothrombin Time: 14.1 seconds (ref 11.6–15.2)

## 2011-04-03 LAB — GLUCOSE, CAPILLARY
Glucose-Capillary: 131 mg/dL — ABNORMAL HIGH (ref 70–99)
Glucose-Capillary: 100 mg/dL — ABNORMAL HIGH (ref 70–99)
Glucose-Capillary: 85 mg/dL (ref 70–99)
Glucose-Capillary: 104 mg/dL — ABNORMAL HIGH (ref 70–99)

## 2011-04-03 LAB — CBC
MCH: 27.4 pg (ref 26.0–34.0)
MCV: 84.3 fL (ref 78.0–100.0)
Platelets: 223 10*3/uL (ref 150–400)
RDW: 14.5 % (ref 11.5–15.5)
WBC: 6.4 10*3/uL (ref 4.0–10.5)

## 2011-04-03 LAB — BASIC METABOLIC PANEL
BUN: 17 mg/dL (ref 6–23)
Calcium: 8.7 mg/dL (ref 8.4–10.5)
Creatinine, Ser: 0.68 mg/dL (ref 0.50–1.10)
GFR calc non Af Amer: >90 mL/min (ref >90)
Glucose, Bld: 98 mg/dL (ref 70–99)

## 2011-04-04 LAB — BASIC METABOLIC PANEL
BUN: 19 mg/dL (ref 6–23)
CO2: 29 mEq/L (ref 19–32)
Calcium: 8.8 mg/dL (ref 8.4–10.5)
Chloride: 102 mEq/L (ref 96–112)
Creatinine, Ser: 0.75 mg/dL (ref 0.50–1.10)
GFR calc Af Amer: >90 mL/min (ref >90)
GFR calc non Af Amer: >90 mL/min (ref >90)
Glucose, Bld: 96 mg/dL (ref 70–99)
Potassium: 4 mEq/L (ref 3.5–5.1)
Sodium: 138 mEq/L (ref 135–145)

## 2011-04-04 LAB — PROTIME-INR
INR: 1.24 (ref 0.00–1.49)
Prothrombin Time: 15.9 seconds — ABNORMAL HIGH (ref 11.6–15.2)

## 2011-04-05 LAB — PROTIME-INR
INR: 1.21 (ref 0.00–1.49)
Prothrombin Time: 15.6 seconds — ABNORMAL HIGH (ref 11.6–15.2)

## 2011-04-06 NOTE — H&P (Signed)
NAMEALAN, RILES NO.:  [id number]  MEDICAL RECORD NO.:  [id number]  LOCATION:  2905                         FACILITY:  [facility]  PHYSICIAN:  Vesta Mixer, M.D. DATE OF BIRTH:  June 23, 1950  DATE OF ADMISSION:  03/27/2011 DATE OF DISCHARGE:                             HISTORY & PHYSICAL   Jennifer Bauer is a 60 year old female with history of "coronary artery disease."  She has a history of heart attack many years ago.  She also has a history of heart murmur and episodes of tachycardia.  She is transferred from [facility] today after presenting with episodes of chest pain, shortness breath and being found to have abnormal cardiac enzymes.  The patient is somewhat of a difficult historian.  She does not have a lot of details to her yes, no answers.  It was difficult to get her to elaborate on her symptoms.  The patient has a history of hypertension and tachycardia.  Her symptoms have been well controlled on medical therapy.  She recently moved from [place] to [place].  She does not have her primary medical doctor yet.  She presented to the emergency room at [facility] this morning with tachycardia and chest discomfort.  The episodes of chest pain were fairly persistent.  Her troponin levels were moderately elevated.  Her last troponin level was found to be 0.46.  At that point, she was transferred to [facility] for further evaluation. During her stay in the emergency room, she had an echocardiogram by the [facility].  She was found to have moderate aortic insufficiency with moderate-to-severe aortic stenosis.  She was also found to have well- preserved left ventricular systolic function with left ventricular hypertrophy.  She has at least mild mitral regurgitation with significant left atrial enlargement.  The patient does not know of any significant problems that she has other than hypertension.  The patient does not do any  regular exercise.  She does not have any previous history of chest pains.  Her current medications include Diovan 320 mg a day, verapamil ER 240 mg a day, Claritin 10 mg a day, Lasix 40 mg twice a day, fluoxetine 40 mg a day.  She has no known drug allergies.  She is allergic to BEE STINGS.  PAST MEDICAL HISTORY: 1. Hypertension. 2. History of tachycardia.  SOCIAL HISTORY:  The patient does not smoke and does not drink alcohol.  FAMILY HISTORY:  Positive for cardiac disease in her father.  REVIEW OF SYSTEMS:  As noted in the HPI.  All other systems are negative.  PHYSICAL EXAMINATION:  GENERAL:  She is a middle-aged white female, and currently in no acute distress.  She is alert and oriented x3.  Her mood and affect are normal.  She is not very descriptive and answers yes or no to many of our questions.  It was difficult to get her to elaborate on her symptoms. VITAL SIGNS:  Heart rate is 81, blood pressure 106/53. HEENT:  Reveals 1+ carotids.  Her carotid upstrokes were somewhat blunted.  She has loud carotid bruits versus radiation over aortic valve murmur. NECK:  Supple. There is no JVD. LUNGS:  Clear. BACK:  Nontender. HEART:  Regular rate.  She has a 2-3/6 systolic murmur at the left sternal border radiating up to the upper left sternal border and to the upper right sternal border and into the carotid arteries. Her radial pulses are fairly brisk and are not delayed. Her distal pulses in her feet are 1 to 2+. She also has a soft systolic murmur radiating to axilla. ABDOMEN:  Reveals good bowel sounds.  There is no hepatosplenomegaly. There is no guarding or rebound. EXTREMITIES:  She has 1 to 2+ pitting edema involving her left leg.  The right leg is basically unremarkable. NEUROLOGIC:  Reveals an unusual affect, but otherwise nonfocal.  Cranial nerves II-XII are intact.  Her EKG reveals normal sinus rhythm.  She has no ST or T-wave changes. Her echocardiogram  performed at [facility] reveals normal left ventricular systolic function.  She has moderate left ventricular hypertrophy with diastolic dysfunction.  She has mild mitral regurgitation with significant left atrial enlargement.  There is moderate aortic insufficiency and aortic stenosis.  The mean aortic valve gradient was 28 mmHg.  The peak gradient was around 70. She has trivial tricuspid regurgitation.  She has moderate pulmonary hypertension with an estimated PA pressure of 40-45 mmHg.  She had a small pericardial effusion.  Her chest x-ray reveals moderate cardiomegaly.  There is no significant infiltrates.  IMPRESSION AND PLAN:  Chest pain.  The patient's chest pain is difficult to assess.  Her troponin is elevated and this certainly could represent an acute coronary syndrome.  In addition, this could been exacerbated by her aortic stenosis/aortic insufficiency.  We will check her cardiac enzymes and we will continue her heparin for now.  We will anticipate doing a right and left heart catheterization for further evaluation of her aortic valve and coronary artery disease.  I would also like to perform an aortogram to further assess her aortic insufficiency.  We will try to obtain did the echo DVD from [facility] so that we can review the films.  I have a report here.  All of her other medical problems remained stable.     Vesta Mixer, M.D.     PJN/MEDQ  D:  03/27/2011  T:  03/28/2011  Job:  [id number]  cc:   [facility]  Electronically Signed by Kristeen Miss M.D. on 04/06/2011 09:53:28 AM

## 2011-04-10 NOTE — Cardiovascular Report (Signed)
  Jennifer Bauer, SANKS NO.:  [id number]  MEDICAL RECORD NO.:  [id number]  LOCATION:  2925                         FACILITY:  [facility]  PHYSICIAN:  Tien Spooner M. [place], M.D.  DATE OF BIRTH:  Jun 09, 1951  DATE OF PROCEDURE:  03/28/2011 DATE OF DISCHARGE:                           CARDIAC CATHETERIZATION   INDICATION FOR PROCEDURE:  This is a 60 year old white female who has a history of hypertension and tachyarrhythmias who was transferred from the emergency department of [facility] for evaluation of chest pain and murmur.  Echocardiogram showed moderate to severe AI with some degree of aortic stenosis.  She has positive cardiac troponins.  PROCEDURES:  Right and left heart catheterization, coronary and left ventricular angiography, and aortic root angiography.  ACCESS:  Via the right femoral artery and vein using standard Seldinger technique.  EQUIPMENT:  5-French 4-cm right and left Judkins catheter, 5-French pigtail catheter, 5-French arterial sheath, 7-French venous sheath, 7- French balloon tip Swan-Ganz catheter.  MEDICATIONS:  Local anesthesia, 1% Xylocaine.  CONTRAST:  120 mL of Omnipaque.  HEMODYNAMIC DATA:  Right atrial pressure is 15/13 with a mean of 11 mmHg.  Right ventricular pressure is 56 with EDP of 12 mmHg.  Pulmonary artery pressure is 68/20 with a mean of 44 mmHg.  Pulmonary capillary wedge pressure is 30/32 with a mean of 28 mmHg.  Aortic pressure is 132/60 with a mean of 88 mmHg and left ventricular pressure is 195 with an EDP of 33 mmHg.  Aortic valve mean gradient is 42 mmHg.  Aortic valve area using the thermodilution cardiac output was 0.66 sq cm.  Using the [facility] cardiac output, it was 0.78 sq cm.  Cardiac output by Fick is 5.81 L/min with an index of 2.67, by thermodilution cardiac output is 4.91 L/minute with an index of 2.25.  ANGIOGRAPHIC DATA:  Left ventricular angiography was performed in the RAO view.  This demonstrates  mild left ventricular enlargement with normal systolic function and ejection fraction 55%.  Aortic root angiography demonstrates that the aortic root is mildly dilated.  The aortic valve is calcified with moderate 3+ aortic insufficiency.  The left main coronary artery arises and distributes normally.  The left main coronary artery is normal.  The left anterior descending artery is normal.  There is a very large ramus intermediate branch which is normal.  Left circumflex coronary artery is normal.  The right coronary artery is normal.  FINAL INTERPRETATION: 1. Normal coronary anatomy. 2. Normal left ventricular function. 3. Severe aortic stenosis with moderate aortic insufficiency. 4. Moderate pulmonary hypertension.          ______________________________ [name], M.D.     PMJ/MEDQ  D:  03/28/2011  T:  03/28/2011  Job:  [id number]  cc:   Vesta Mixer, M.D.  Electronically Signed by Avani Sensabaugh [place] M.D. on 04/10/2011 02:56:47 PM

## 2011-04-17 ENCOUNTER — Encounter: Payer: Self-pay | Admitting: Thoracic Surgery (Cardiothoracic Vascular Surgery)

## 2011-04-17 DIAGNOSIS — M1990 Unspecified osteoarthritis, unspecified site: Secondary | ICD-10-CM | POA: Insufficient documentation

## 2011-04-17 DIAGNOSIS — I340 Nonrheumatic mitral (valve) insufficiency: Secondary | ICD-10-CM | POA: Insufficient documentation

## 2011-04-17 DIAGNOSIS — I10 Essential (primary) hypertension: Secondary | ICD-10-CM | POA: Insufficient documentation

## 2011-04-17 DIAGNOSIS — I352 Nonrheumatic aortic (valve) stenosis with insufficiency: Secondary | ICD-10-CM | POA: Insufficient documentation

## 2011-04-17 DIAGNOSIS — R000 Tachycardia, unspecified: Secondary | ICD-10-CM | POA: Insufficient documentation

## 2011-04-18 ENCOUNTER — Encounter: Payer: Self-pay | Admitting: Physician Assistant

## 2011-04-18 ENCOUNTER — Ambulatory Visit (INDEPENDENT_AMBULATORY_CARE_PROVIDER_SITE_OTHER): Payer: Medicaid Other | Admitting: Physician Assistant

## 2011-04-18 ENCOUNTER — Telehealth: Payer: Self-pay | Admitting: *Deleted

## 2011-04-18 DIAGNOSIS — I4891 Unspecified atrial fibrillation: Secondary | ICD-10-CM

## 2011-04-18 DIAGNOSIS — I480 Paroxysmal atrial fibrillation: Secondary | ICD-10-CM | POA: Insufficient documentation

## 2011-04-18 DIAGNOSIS — I352 Nonrheumatic aortic (valve) stenosis with insufficiency: Secondary | ICD-10-CM

## 2011-04-18 DIAGNOSIS — I359 Nonrheumatic aortic valve disorder, unspecified: Secondary | ICD-10-CM

## 2011-04-18 DIAGNOSIS — I10 Essential (primary) hypertension: Secondary | ICD-10-CM

## 2011-04-18 NOTE — Telephone Encounter (Signed)
Jennifer Bauer, [facility] needs orders which he already has/ needs DX, will be done and sent back.

## 2011-04-18 NOTE — Progress Notes (Signed)
History of Present Illness: Primary Cardiologist:  Dr. Delane Ginger   Jennifer Bauer is a 60 y.o. female who presents for post hospital follow up.  She was admitted 10/8-10/17.  She has a reported h/o valvular heart disease for the past 20+ years.  She just moved to [place] from [place].  She presented with CP, tachy palpitations and dyspnea.  Initial CE's were abnormal at [facility] and she was transferred to [facility].  Echo prior to tx demonstrated mod to severe AS.  LHC 10/8: normal LVF with EF 55%, normal cors and mod AI, severe AS with mean gradient 42 mmHg.  Carotids were neg for ICA stenosis.  She was referred for AVR with Dr. 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  Post op course was complicated by AFib that converted to NSR with amiodarone.    Hospital Labs (03/2011): Hgb 10.1, K 4, creatinine 0.75, ALT 19, A1C 5.7, TSH 0.820.  Since d/c from the hospital she is doing well.  Her chest is sore.  She denies dyspnea.  She is walking as much as she can.  No complaints of DOE.  No orthopnea, PND.  She has chronic LE edema with L>R.  This seems to be stable to improved since she came home.  No fevers or chills.  No palpitations.  No syncope or near syncope.  She is taking all of her medications with the help of her family.  She would like to think about cardiac rehab.   Past Medical History  Diagnosis Date  . Aortic insufficiency and aortic stenosis     s/p Tissue AVR with Dr. Cornelius Moras 03/2011; preop LHC with normal cors;  pre op carotid dopplers neg for ICA stenosis  . Mitral regurgitation   . HTN (hypertension)   . Arthritis   . SVT (supraventricular tachycardia)   . Atrial fibrillation     post op after AVR 10/12    Current Outpatient Prescriptions  Medication Sig Dispense Refill  . amiodarone (PACERONE) 200 MG tablet Take 1 tablet (200 mg total) by mouth daily.      Marland Kitchen aspirin EC 81 MG tablet Take 81 mg by mouth daily.        Marland Kitchen FLUoxetine (PROZAC) 40 MG capsule Take 40 mg by mouth  daily.        . furosemide (LASIX) 40 MG tablet Take 40 mg by mouth 2 (two) times daily.        Marland Kitchen lisinopril (PRINIVIL,ZESTRIL) 20 MG tablet Take 20 mg by mouth daily.        Marland Kitchen loratadine-pseudoephedrine (CLARITIN-D 24-HOUR) 10-240 MG per 24 hr tablet Take 1 tablet by mouth daily.        . metoprolol tartrate (LOPRESSOR) 25 MG tablet Take 25 mg by mouth 2 (two) times daily.        . potassium chloride SA (K-DUR,KLOR-CON) 20 MEQ tablet Take 20 mEq by mouth daily.          Allergies: Allergies  Allergen Reactions  . Acetaminophen     tachycardia    History  Substance Use Topics  . Smoking status: Never Smoker   . Smokeless tobacco: Not on file  . Alcohol Use: No     ROS:  Please see the history of present illness.  All other systems reviewed and negative.    Vital Signs: BP 112/62  Pulse 67  Ht 5\' 7"  (1.702 m)  Wt 230 lb (104.327 kg)  BMI 36.02 kg/m2  PHYSICAL EXAM:  Well nourished, well developed, in no acute distress HEENT: normal Neck: no JVD Cardiac:  normal S1, S2; RRR; 2/6 systolic murmur heard best at RUSB Lungs:  clear to auscultation bilaterally, no wheezing, rhonchi or rales Abd: soft, nontender, no hepatomegaly Ext: 1+ L> R lower extremity edema; calves soft and non-tender bilaterally; RFA without bruit Skin: warm and dry Neuro:  CNs 2-12 intact, no focal abnormalities noted Psych:  Pleasant, normal affect  EKG:  NSR, HR 67, diffuse lateral TW inversions, no significant change from prior tracings  ASSESSMENT AND PLAN:

## 2011-04-18 NOTE — Assessment & Plan Note (Signed)
S/p tissue AVR.  Doing well.  She will think about cardiac rehab and let us know.  She will continue walking as much as she can.  Follow up with Dr. Cornelius Moras is scheduled next week.  I will have her get a follow up echo.  Follow up with Dr. Elease Hashimoto in 6 weeks.

## 2011-04-18 NOTE — Assessment & Plan Note (Signed)
Post op.  Now maintaining NSR.  Decrease amiodarone to 200 mg QD.  If still in NSR at follow up, plan on stopping at that time.

## 2011-04-18 NOTE — Assessment & Plan Note (Signed)
Controlled.  Continue current therapy. Check BMET today to follow up on renal fxn and K+

## 2011-04-18 NOTE — Patient Instructions (Addendum)
Your physician recommends that you schedule a follow-up appointment in: 6 WEEKS WITH DR. Elease Hashimoto  Your physician has requested that you have an echocardiogram DX 424.1 VALVE DISORDER, 427.31 AFIB THIS CAN BE DONE THE SAME DAY PT COMES IN TO SEE DR. NAHSER PER SCOTT WEAVER, PA-C. Echocardiography is a painless test that uses sound waves to create images of your heart. It provides your doctor with information about the size and shape of your heart and how well your heart's chambers and valves are working. This procedure takes approximately one hour. There are no restrictions for this procedure.  Your physician recommends that you return for lab work in: TODAY BMET 427.31 AFIB

## 2011-04-19 ENCOUNTER — Other Ambulatory Visit: Payer: Self-pay | Admitting: Thoracic Surgery (Cardiothoracic Vascular Surgery)

## 2011-04-19 ENCOUNTER — Encounter: Payer: Self-pay | Admitting: Physician Assistant

## 2011-04-19 DIAGNOSIS — I359 Nonrheumatic aortic valve disorder, unspecified: Secondary | ICD-10-CM

## 2011-04-19 LAB — BASIC METABOLIC PANEL
BUN: 38 mg/dL — ABNORMAL HIGH (ref 6–23)
CO2: 27 mEq/L (ref 19–32)
Calcium: 9.2 mg/dL (ref 8.4–10.5)
Chloride: 98 mEq/L (ref 96–112)
Creatinine, Ser: 1.3 mg/dL — ABNORMAL HIGH (ref 0.4–1.2)
Glucose, Bld: 84 mg/dL (ref 70–99)

## 2011-04-20 ENCOUNTER — Telehealth: Payer: Self-pay | Admitting: *Deleted

## 2011-04-20 DIAGNOSIS — I10 Essential (primary) hypertension: Secondary | ICD-10-CM

## 2011-04-20 NOTE — Telephone Encounter (Signed)
Lab order placed for bmet 04/27/11. Danielle Rankin

## 2011-04-24 ENCOUNTER — Ambulatory Visit (INDEPENDENT_AMBULATORY_CARE_PROVIDER_SITE_OTHER): Payer: Self-pay | Admitting: Thoracic Surgery (Cardiothoracic Vascular Surgery)

## 2011-04-24 ENCOUNTER — Encounter: Payer: Self-pay | Admitting: Thoracic Surgery (Cardiothoracic Vascular Surgery)

## 2011-04-24 ENCOUNTER — Ambulatory Visit
Admission: RE | Admit: 2011-04-24 | Discharge: 2011-04-24 | Disposition: A | Discharge status: Home / Self Care | Payer: Medicaid Other | Source: Ambulatory Visit | Attending: Thoracic Surgery (Cardiothoracic Vascular Surgery) | Admitting: Thoracic Surgery (Cardiothoracic Vascular Surgery)

## 2011-04-24 VITALS — BP 93/57 | HR 69 | Resp 20 | Ht 67.0 in | Wt 235.0 lb

## 2011-04-24 DIAGNOSIS — Z954 Presence of other heart-valve replacement: Secondary | ICD-10-CM

## 2011-04-24 DIAGNOSIS — I359 Nonrheumatic aortic valve disorder, unspecified: Secondary | ICD-10-CM

## 2011-04-24 DIAGNOSIS — Z952 Presence of prosthetic heart valve: Secondary | ICD-10-CM

## 2011-04-24 DIAGNOSIS — I350 Nonrheumatic aortic (valve) stenosis: Secondary | ICD-10-CM

## 2011-04-24 NOTE — Progress Notes (Signed)
  HPI: Patient returns for routine postoperative follow-up having undergone aortic valve replacement on 03/30/2011. The patient's early postoperative recovery while in the hospital was notable for a couple of brief episodes of PAF. Since hospital discharge the patient reports steady improvement.  She has mild residual soreness in her chest. She is eating well. She is sleeping well. She has no shortness of breath. She she seems to be getting around fairly well physically. She has not had any palpitations or dizzy spells. The remainder of her review of systems is unrevealing.   Current Outpatient Prescriptions  Medication Sig Dispense Refill  . amiodarone (PACERONE) 200 MG tablet Take 1 tablet (200 mg total) by mouth daily.      Marland Kitchen aspirin EC 81 MG tablet Take 81 mg by mouth daily.        Marland Kitchen FLUoxetine (PROZAC) 40 MG capsule Take 40 mg by mouth daily.        . furosemide (LASIX) 40 MG tablet Take 40 mg by mouth daily.       Marland Kitchen lisinopril (PRINIVIL,ZESTRIL) 20 MG tablet Take 20 mg by mouth daily.        Marland Kitchen loratadine-pseudoephedrine (CLARITIN-D 24-HOUR) 10-240 MG per 24 hr tablet Take 1 tablet by mouth daily.        . metoprolol tartrate (LOPRESSOR) 25 MG tablet Take 25 mg by mouth 2 (two) times daily.        . potassium chloride SA (K-DUR,KLOR-CON) 20 MEQ tablet Take 20 mEq by mouth daily.         Physical Exam: On physical exam the patient looks quite good. Her sternal incision is healing nicely. Breath sounds are clear to auscultation and symmetrical bilaterally. Cardiovascular exam is notable for regular rate and rhythm. There is a soft grade 2/6 systolic murmur heard best at the right sternal border. The abdomen is soft and nontender. The extremities are warm and well-perfused. There is moderate bilateral lower extremity edema which is stable and chronic.  Diagnostic Tests: Chest x-ray performed today at [facility] the imaging center is reviewed. This demonstrates clear lung fields bilaterally. All  the sternal wires appear intact. There are no significant pleural effusions.  Impression: Excellent progress following aortic valve replacement.  Plan: I've encouraged patient to gradually increase her physical activity as tolerated with her primary limitation at this time being that she refrain from any heavy lifting or strenuous use of her arms or shoulders for at least another 2 months. We have not made any changes in her current medications. I think she will need to remain on a diuretic in definitely. She remains on low-dose amiodarone at this time. I have suggested that she not refill this prescription one that runs out. We will plan to see her back in 3 months for routine followup.

## 2011-04-24 NOTE — Patient Instructions (Signed)
The patient has been reminded to continue to avoid any heavy lifting or strenuous use of arms or shoulders for at least a total of three months from the time of surgery.  

## 2011-04-27 ENCOUNTER — Other Ambulatory Visit (INDEPENDENT_AMBULATORY_CARE_PROVIDER_SITE_OTHER): Payer: Medicaid Other | Admitting: *Deleted

## 2011-04-27 DIAGNOSIS — I10 Essential (primary) hypertension: Secondary | ICD-10-CM

## 2011-04-27 LAB — BASIC METABOLIC PANEL
BUN: 20 mg/dL (ref 6–23)
Creatinine, Ser: 0.8 mg/dL (ref 0.4–1.2)
GFR: 73.76 mL/min (ref >60.00)
Glucose, Bld: 91 mg/dL (ref 70–99)

## 2011-05-01 ENCOUNTER — Encounter: Payer: Self-pay | Admitting: Thoracic Surgery (Cardiothoracic Vascular Surgery)

## 2011-05-30 ENCOUNTER — Ambulatory Visit (INDEPENDENT_AMBULATORY_CARE_PROVIDER_SITE_OTHER): Payer: Medicaid Other | Admitting: Cardiovascular Disease

## 2011-05-30 ENCOUNTER — Ambulatory Visit (HOSPITAL_COMMUNITY): Payer: Medicaid Other | Attending: Physician Assistant | Admitting: Radiology

## 2011-05-30 ENCOUNTER — Encounter: Payer: Self-pay | Admitting: Cardiovascular Disease

## 2011-05-30 VITALS — BP 106/65 | HR 69 | Ht 67.0 in | Wt 226.0 lb

## 2011-05-30 DIAGNOSIS — I4891 Unspecified atrial fibrillation: Secondary | ICD-10-CM | POA: Insufficient documentation

## 2011-05-30 DIAGNOSIS — I10 Essential (primary) hypertension: Secondary | ICD-10-CM | POA: Insufficient documentation

## 2011-05-30 DIAGNOSIS — I359 Nonrheumatic aortic valve disorder, unspecified: Secondary | ICD-10-CM

## 2011-05-30 MED ORDER — FUROSEMIDE 40 MG PO TABS: 40.0000 mg | ORAL_TABLET | Freq: Every day | ORAL | Status: DC

## 2011-05-30 NOTE — Assessment & Plan Note (Signed)
Her rhythm is quite regular. We have already stopped her amiodarone.

## 2011-05-30 NOTE — Progress Notes (Signed)
    Jennifer Bauer Date of Birth  1951-01-21 [facility] [address]    [address] [place], [place]  [address] [phone]  Fax  [phone]  History of Present Illness:  Jennifer Bauer is a 60 year old female who was easily seen for aortic valve stenosis and insufficiency. She had an aortic valve replacement with Dr. Cornelius Moras. She saw our PA several weeks ago. She seems to be making good progress. She had an echocardiogram earlier today.  She's been exercising a little bit but is not doing quite as much as she should be.  Current Outpatient Prescriptions on File Prior to Visit  Medication Sig Dispense Refill  . aspirin EC 81 MG tablet Take 81 mg by mouth daily.        Marland Kitchen FLUoxetine (PROZAC) 40 MG capsule Take 40 mg by mouth daily.        . furosemide (LASIX) 40 MG tablet Take 40 mg by mouth 2 (two) times daily.       Marland Kitchen lisinopril (PRINIVIL,ZESTRIL) 20 MG tablet Take 20 mg by mouth daily.        Marland Kitchen loratadine-pseudoephedrine (CLARITIN-D 24-HOUR) 10-240 MG per 24 hr tablet Take 1 tablet by mouth daily.        . metoprolol tartrate (LOPRESSOR) 25 MG tablet Take 25 mg by mouth 2 (two) times daily.        . potassium chloride SA (K-DUR,KLOR-CON) 20 MEQ tablet Take 20 mEq by mouth daily.         Allergies  Allergen Reactions  . Acetaminophen     tachycardia    Past Medical History  Diagnosis Date  . Aortic insufficiency and aortic stenosis     s/p Tissue AVR with Dr. Cornelius Moras 03/2011; preop LHC with normal cors;  pre op carotid dopplers neg for ICA stenosis  . Mitral regurgitation   . HTN (hypertension)   . Arthritis   . SVT (supraventricular tachycardia)   . Atrial fibrillation     post op after AVR 10/12    Past Surgical History  Procedure Date  . Hysterctomy   . Tonsillectomy   . Aortic valve replacement 03/30/2011    #17mm [facility] Ease pericardial tissue valve    History  Smoking status  . Never Smoker   Smokeless tobacco  . Not on file    History  Alcohol  Use No    Family History  Problem Relation Age of Onset  . Heart attack Father   . Colon cancer Sister     heavy smoker  . Stroke Father   . Diabetes Sister   . Diabetes Father     Reviw of Systems:  Reviewed in the HPI.  All other systems are negative.  Physical Exam: BP 106/65  Pulse 69  Ht 5\' 7"  (1.702 m)  Wt 226 lb (102.513 kg)  BMI 35.40 kg/m2 The patient is alert and oriented x 3.  The mood and affect are normal.   Skin: warm and dry.  Color is normal.    HEENT:   [place]/AT, normal carotids, no JVD  Lungs: clear   Heart: RR, soft systolic murmur, sternotomy is healing well    Abdomen: non tender  Extremities:   Mild left left edema  Neuro:  unchanged    ECG:   Assessment / Plan:

## 2011-05-30 NOTE — Assessment & Plan Note (Signed)
S/P   Median sternotomy for aortic valve replacement (21-mm [facility] ease pericardial tissue valve.) 03/30/11 Dr Cornelius Moras.  She's doing fairly well. She seems to be healing up quite nicely. She's longer having any shortness of breath.  She had an echocardiogram today the results of which are pending.  She does complain of some dizziness. Her blood pressure is also marginally low. We'll decrease her Lasix to 40 mg once a day. I'll see her again in 3 months for an office visit and basic metabolic profile.

## 2011-05-30 NOTE — Patient Instructions (Signed)
Your physician recommends that you schedule a follow-up appointment in: 3 months  Your physician recommends that you return for lab work in: 3 months / bmp  Your physician has recommended you make the following change in your medication:   REDUCE lasix to 40 mg one time a day in am.

## 2011-06-05 ENCOUNTER — Other Ambulatory Visit: Payer: Self-pay | Admitting: Physician Assistant

## 2011-06-06 ENCOUNTER — Encounter: Payer: Self-pay | Admitting: *Deleted

## 2011-06-09 ENCOUNTER — Telehealth: Payer: Self-pay | Admitting: Cardiovascular Disease

## 2011-06-09 MED ORDER — POTASSIUM CHLORIDE CRYS ER 20 MEQ PO TBCR: 20.0000 meq | EXTENDED_RELEASE_TABLET | Freq: Every day | ORAL | Status: DC

## 2011-06-09 NOTE — Telephone Encounter (Signed)
New Problem:    Patient's sister is calling to have Dr. Elease Hashimoto write her sister a prescription for potassium chloride SA (K-DUR,KLOR-CON) 20 MEQ tablet at the [facility] in [place] [phone]).

## 2011-07-31 ENCOUNTER — Encounter: Payer: Self-pay | Admitting: Thoracic Surgery (Cardiothoracic Vascular Surgery)

## 2011-07-31 ENCOUNTER — Ambulatory Visit (INDEPENDENT_AMBULATORY_CARE_PROVIDER_SITE_OTHER): Payer: Self-pay | Admitting: Thoracic Surgery (Cardiothoracic Vascular Surgery)

## 2011-07-31 VITALS — BP 120/79 | HR 67 | Resp 18 | Ht 67.0 in | Wt 238.0 lb

## 2011-07-31 DIAGNOSIS — Z954 Presence of other heart-valve replacement: Secondary | ICD-10-CM

## 2011-07-31 DIAGNOSIS — I359 Nonrheumatic aortic valve disorder, unspecified: Secondary | ICD-10-CM

## 2011-07-31 DIAGNOSIS — Z952 Presence of prosthetic heart valve: Secondary | ICD-10-CM

## 2011-07-31 NOTE — Progress Notes (Signed)
[address]            [name] [address]          [phone]     CARDIOTHORACIC SURGERY OFFICE NOTE  Referring Provider is Nahser, Tiajuana Amass., MD PCP is Marin Comment, FNP, FNP   HPI:  Patient returns for follow-up s/p aortic valve replacement on 03/30/2011.  She was last seen here in the office on 04/24/2011. Since then she has done quite well. She was seen in followup by Dr. Elease Hashimoto or in December an echocardiogram was performed that looked good. She returns for routine followup today.  She is doing exceptionally well. She has no shortness of breath and feels much better than she has in several years. She is getting around well. She reports an occasional twinge of pain in her chest if she coughs or moves her arms forcefully but otherwise she feels fine.   Current Outpatient Prescriptions  Medication Sig Dispense Refill  . clonazePAM (KLONOPIN) 0.5 MG tablet Take 0.5 mg by mouth 2 (two) times daily.      . furosemide (LASIX) 40 MG tablet Take 1 tablet (40 mg total) by mouth daily.  30 tablet  5  . loratadine-pseudoephedrine (CLARITIN-D 24-HOUR) 10-240 MG per 24 hr tablet Take 1 tablet by mouth daily.       . metoprolol tartrate (LOPRESSOR) 25 MG tablet Take 25 mg by mouth 2 (two) times daily.        . potassium chloride SA (K-DUR,KLOR-CON) 20 MEQ tablet Take 1 tablet (20 mEq total) by mouth daily.  30 tablet  11  . aspirin EC 81 MG tablet Take 81 mg by mouth daily.        Marland Kitchen lisinopril (PRINIVIL,ZESTRIL) 20 MG tablet Take 20 mg by mouth daily.            Physical Exam:   BP 120/79  Pulse 67  Resp 18  Ht 5\' 7"  (1.702 m)  Wt 238 lb (107.956 kg)  BMI 37.28 kg/m2  SpO2 97%  General:  Well-appearing  Chest:   Sternum has healed nicely. Breath sounds are clear  CV:   Regular rate and rhythm without murmur  Abdomen:  Soft and nontender  Extremities:  Warm and well-perfused  Diagnostic  Tests:   ------------------------------------------------------------ Echocardiography  Patient: Jennifer Bauer, Jennifer Bauer MR #: [id number] Study Date: 05/30/2011 ------------------------------------------------------------ LV EF: 55% - 65%  ------------------------------------------------------------ Indications: 424.1 Aortic valve disorders.  ------------------------------------------------------------ History: PMH: Acquired from the patient and from the patient's chart. Atrial fibrillation. Aortic valve disease. Risk factors: Hypertension.  ------------------------------------------------------------ Study Conclusions  - Left ventricle: Wall thickness was increased in a pattern of mild LVH. Systolic function was normal. The estimated ejection fraction was in the range of 55% to 65%. Wall motion was normal; there were no regional wall motion abnormalities. - Aortic valve: A bicuspid morphology cannot be excluded. There was mild stenosis. - Mitral valve: Calcified annulus. Moderately thickened, mildly calcified leaflets . - Left atrium: The atrium was moderately dilated. - Atrial septum: No defect or patent foramen ovale was identified. - Pericardium, extracardiac: A trivial pericardial effusion was identified. Prepared and Electronically Authenticated by  Lewayne Bunting, MD 2012-12-11T16:42:41.030   Impression:  Patient is doing well 4 months following aortic valve replacement. The echocardiogram she had performed in December looks good.  Plan:  In the future patient will call and return to see  [place] as needed. She no longer has any physical restrictions with respect to her surgery.   Salvatore Decent. Cornelius Moras, MD 07/31/2011 12:13 PM

## 2011-09-06 ENCOUNTER — Ambulatory Visit (INDEPENDENT_AMBULATORY_CARE_PROVIDER_SITE_OTHER): Payer: Medicaid Other | Admitting: Cardiovascular Disease

## 2011-09-06 ENCOUNTER — Encounter: Payer: Self-pay | Admitting: Cardiovascular Disease

## 2011-09-06 VITALS — BP 132/82 | HR 66 | Ht 67.0 in | Wt 250.8 lb

## 2011-09-06 DIAGNOSIS — Z954 Presence of other heart-valve replacement: Secondary | ICD-10-CM

## 2011-09-06 DIAGNOSIS — I352 Nonrheumatic aortic (valve) stenosis with insufficiency: Secondary | ICD-10-CM

## 2011-09-06 DIAGNOSIS — I359 Nonrheumatic aortic valve disorder, unspecified: Secondary | ICD-10-CM

## 2011-09-06 DIAGNOSIS — Z952 Presence of prosthetic heart valve: Secondary | ICD-10-CM

## 2011-09-06 DIAGNOSIS — I10 Essential (primary) hypertension: Secondary | ICD-10-CM

## 2011-09-06 LAB — BASIC METABOLIC PANEL
BUN: 24 mg/dL — ABNORMAL HIGH (ref 6–23)
CO2: 28 mEq/L (ref 19–32)
Chloride: 99 mEq/L (ref 96–112)
Creatinine, Ser: 1.1 mg/dL (ref 0.4–1.2)
Potassium: 4.9 mEq/L (ref 3.5–5.1)

## 2011-09-06 LAB — HEPATIC FUNCTION PANEL
Albumin: 3.6 g/dL (ref 3.5–5.2)
Alkaline Phosphatase: 67 U/L (ref 39–117)
Bilirubin, Direct: 0.1 mg/dL (ref 0.0–0.3)
Total Protein: 7.1 g/dL (ref 6.0–8.3)

## 2011-09-06 LAB — LIPID PANEL
Cholesterol: 172 mg/dL (ref 0–200)
LDL Cholesterol: 87 mg/dL (ref 0–99)
Total CHOL/HDL Ratio: 3
Triglycerides: 169 mg/dL — ABNORMAL HIGH (ref 0.0–149.0)
VLDL: 33.8 mg/dL (ref 0.0–40.0)

## 2011-09-06 MED ORDER — FUROSEMIDE 40 MG PO TABS: 80.0000 mg | ORAL_TABLET | Freq: Every day | ORAL | Status: DC

## 2011-09-06 NOTE — Progress Notes (Signed)
    SRITHA CHAUNCEY Date of Birth  1951-05-05 [facility] [address]    [address] [place], [place]  [address] [phone]  Fax  [phone]  History of Present Illness:  Jennifer Bauer is a 61 year old female who was easily seen for aortic valve stenosis and insufficiency. She had an bioprosthetic aortic valve replacement with Dr. Cornelius Moras. She seems to be doing fairly well. She continues to have lots of leg edema but she's had chronic leg edema for many years.  Current Outpatient Prescriptions on File Prior to Visit  Medication Sig Dispense Refill  . clonazePAM (KLONOPIN) 0.5 MG tablet Take 0.5 mg by mouth 2 (two) times daily.      Marland Kitchen loratadine-pseudoephedrine (CLARITIN-D 24-HOUR) 10-240 MG per 24 hr tablet Take 1 tablet by mouth daily.       . metoprolol tartrate (LOPRESSOR) 25 MG tablet Take 25 mg by mouth 2 (two) times daily.        . potassium chloride SA (K-DUR,KLOR-CON) 20 MEQ tablet Take 1 tablet (20 mEq total) by mouth daily.  30 tablet  11  . aspirin EC 81 MG tablet Take 81 mg by mouth daily.        Marland Kitchen lisinopril (PRINIVIL,ZESTRIL) 20 MG tablet Take 20 mg by mouth daily.          Allergies  Allergen Reactions  . Acetaminophen     tachycardia    Past Medical History  Diagnosis Date  . Aortic insufficiency and aortic stenosis     s/p Tissue AVR with Dr. Cornelius Moras 03/2011; preop LHC with normal cors;  pre op carotid dopplers neg for ICA stenosis  . Mitral regurgitation   . HTN (hypertension)   . Arthritis   . SVT (supraventricular tachycardia)   . Atrial fibrillation     post op after AVR 10/12    Past Surgical History  Procedure Date  . Hysterctomy   . Tonsillectomy   . Aortic valve replacement 03/30/2011    #31mm [facility] Ease pericardial tissue valve    History  Smoking status  . Never Smoker   Smokeless tobacco  . Not on file    History  Alcohol Use No    Family History  Problem Relation Age of Onset  . Heart attack Father   . Colon cancer  Sister     heavy smoker  . Stroke Father   . Diabetes Sister   . Diabetes Father     Reviw of Systems:  Reviewed in the HPI.  All other systems are negative.  Physical Exam: BP 132/82  Pulse 66  Ht 5\' 7"  (1.702 m)  Wt 250 lb 12.8 oz (113.762 kg)  BMI 39.28 kg/m2 The patient is alert and oriented x 3.  The mood and affect are normal.   Skin: warm and dry.  Color is normal.    HEENT:   [place]/AT, normal carotids, no JVD  Lungs: clear   Heart: RR, soft systolic murmur, sternotomy is healing well    Abdomen: non tender  Extremities:   2-3+ bilateral leg edema  Neuro:  unchanged    ECG:   Assessment / Plan:

## 2011-09-06 NOTE — Assessment & Plan Note (Addendum)
Jennifer Bauer is doing well. She seems to be healing up nicely from her bioprosthetic aortic valve replacement.  She has chronic leg edema. I've advised her to elevate her legs several times a day. I've given her a prescription for compression hose. I'll see her back in 6 months.  We will draw some labs today including BMP and BNP

## 2011-09-06 NOTE — Patient Instructions (Signed)
Your physician wants you to follow-up in: 6 MONTHS  You will receive a reminder letter in the mail two months in advance. If you don't receive a letter, please call our office to schedule the follow-up appointment.  Your physician recommends that you return for a FASTING lipid profile: TODAY AND IN 6 MONTHS   WEAR COMPRESSION HOSE DAILY/ SCRIPT PROVIDED,  ELEVATE LEGS 20-30 MINUTES 3-4 TIMES A DAY WATCH SODIUM INTAKE.

## 2011-09-08 ENCOUNTER — Other Ambulatory Visit: Payer: Self-pay | Admitting: *Deleted

## 2011-09-08 MED ORDER — LISINOPRIL 20 MG PO TABS: 20.0000 mg | ORAL_TABLET | Freq: Every day | ORAL | Status: DC

## 2012-02-23 ENCOUNTER — Encounter: Payer: Self-pay | Admitting: Cardiovascular Disease

## 2012-08-02 IMAGING — CR DG CHEST 1V PORT
1 series · 1 of 1 positions shown · non-contrast
Comparison: 04/01/2011; 03/31/2011; 03/30/2011; chest CT -
03/28/2011

CLINICAL DATA: Post CABG

PORTABLE CHEST - 1 VIEW

[view not recorded]
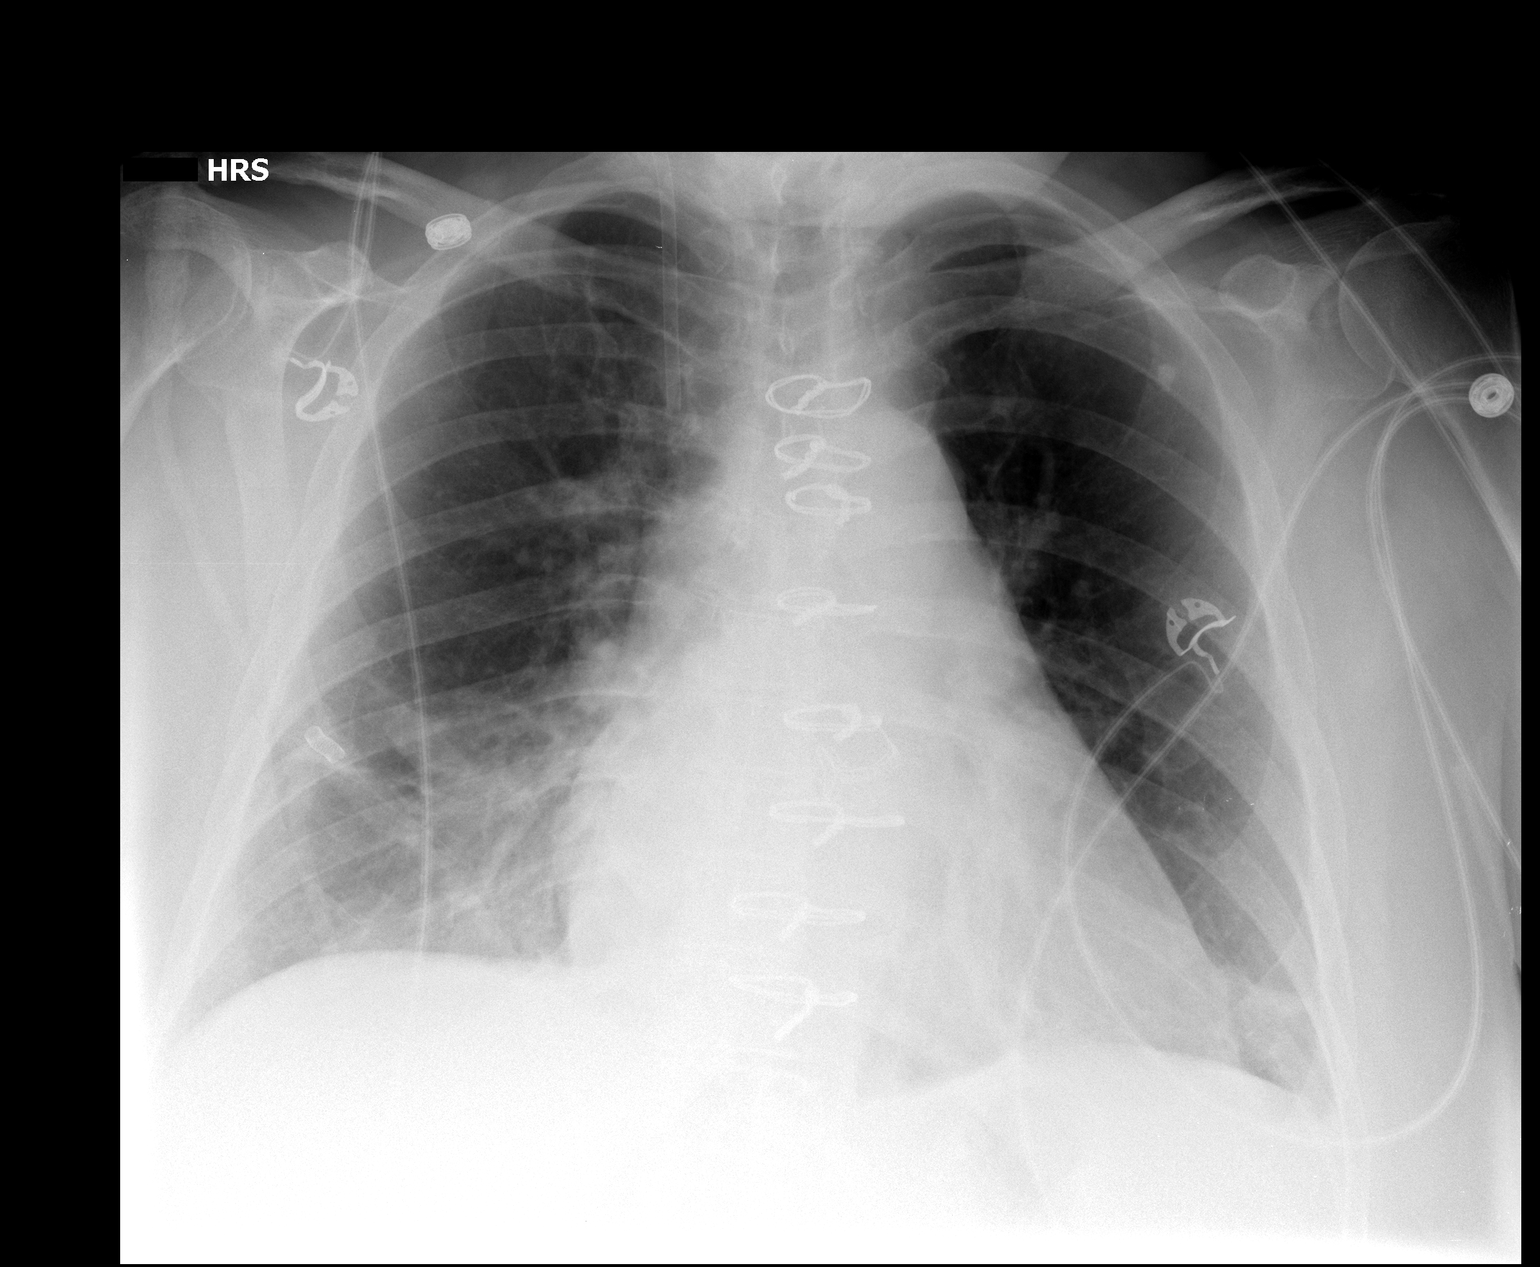

[1 of 1 positions shown; findings below may reference images not displayed]

FINDINGS: Unchanged enlarged cardiac silhouette and mediastinal
contours post median sternotomy and valve replacement.  Stable
support apparatus.  No definite pneumothorax.  No change to minimal
improved aeration of the lungs with persistent bibasilar
heterogeneous opacities, likely atelectasis.  No definite pleural
effusions.  Unchanged bones.
IMPRESSION: Unchanged to minimally improved pulmonary edema.

## 2012-08-24 IMAGING — CR DG CHEST 2V
2 series · 2 of 2 positions shown · non-contrast
Comparison: Portable chest x-rays 03/30/2011 through 04/02/2011.

CLINICAL DATA: 3 weeks post aortic valve replacement.

CHEST - 2 VIEW 04/24/2011:

[w chest pa]
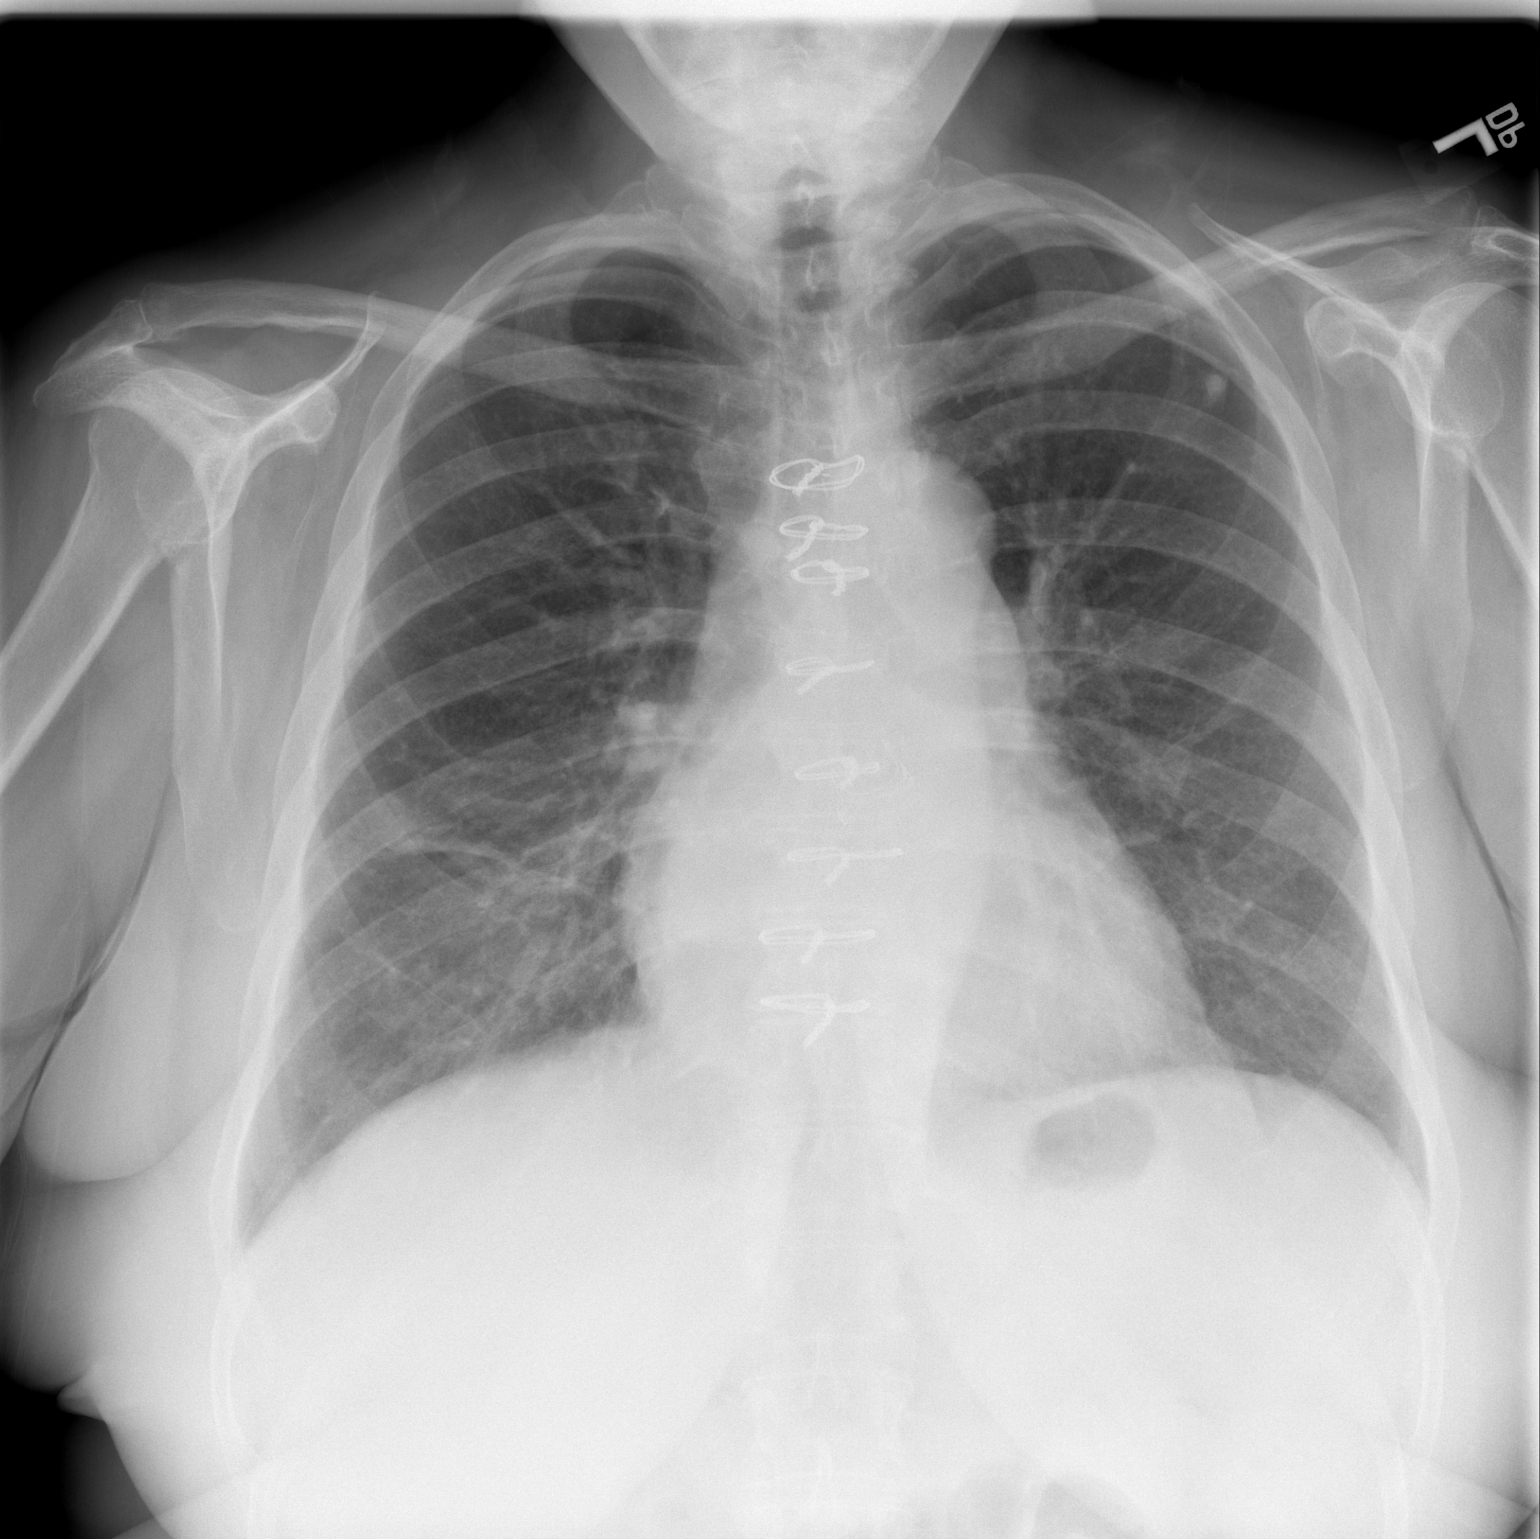

[w chest lat]
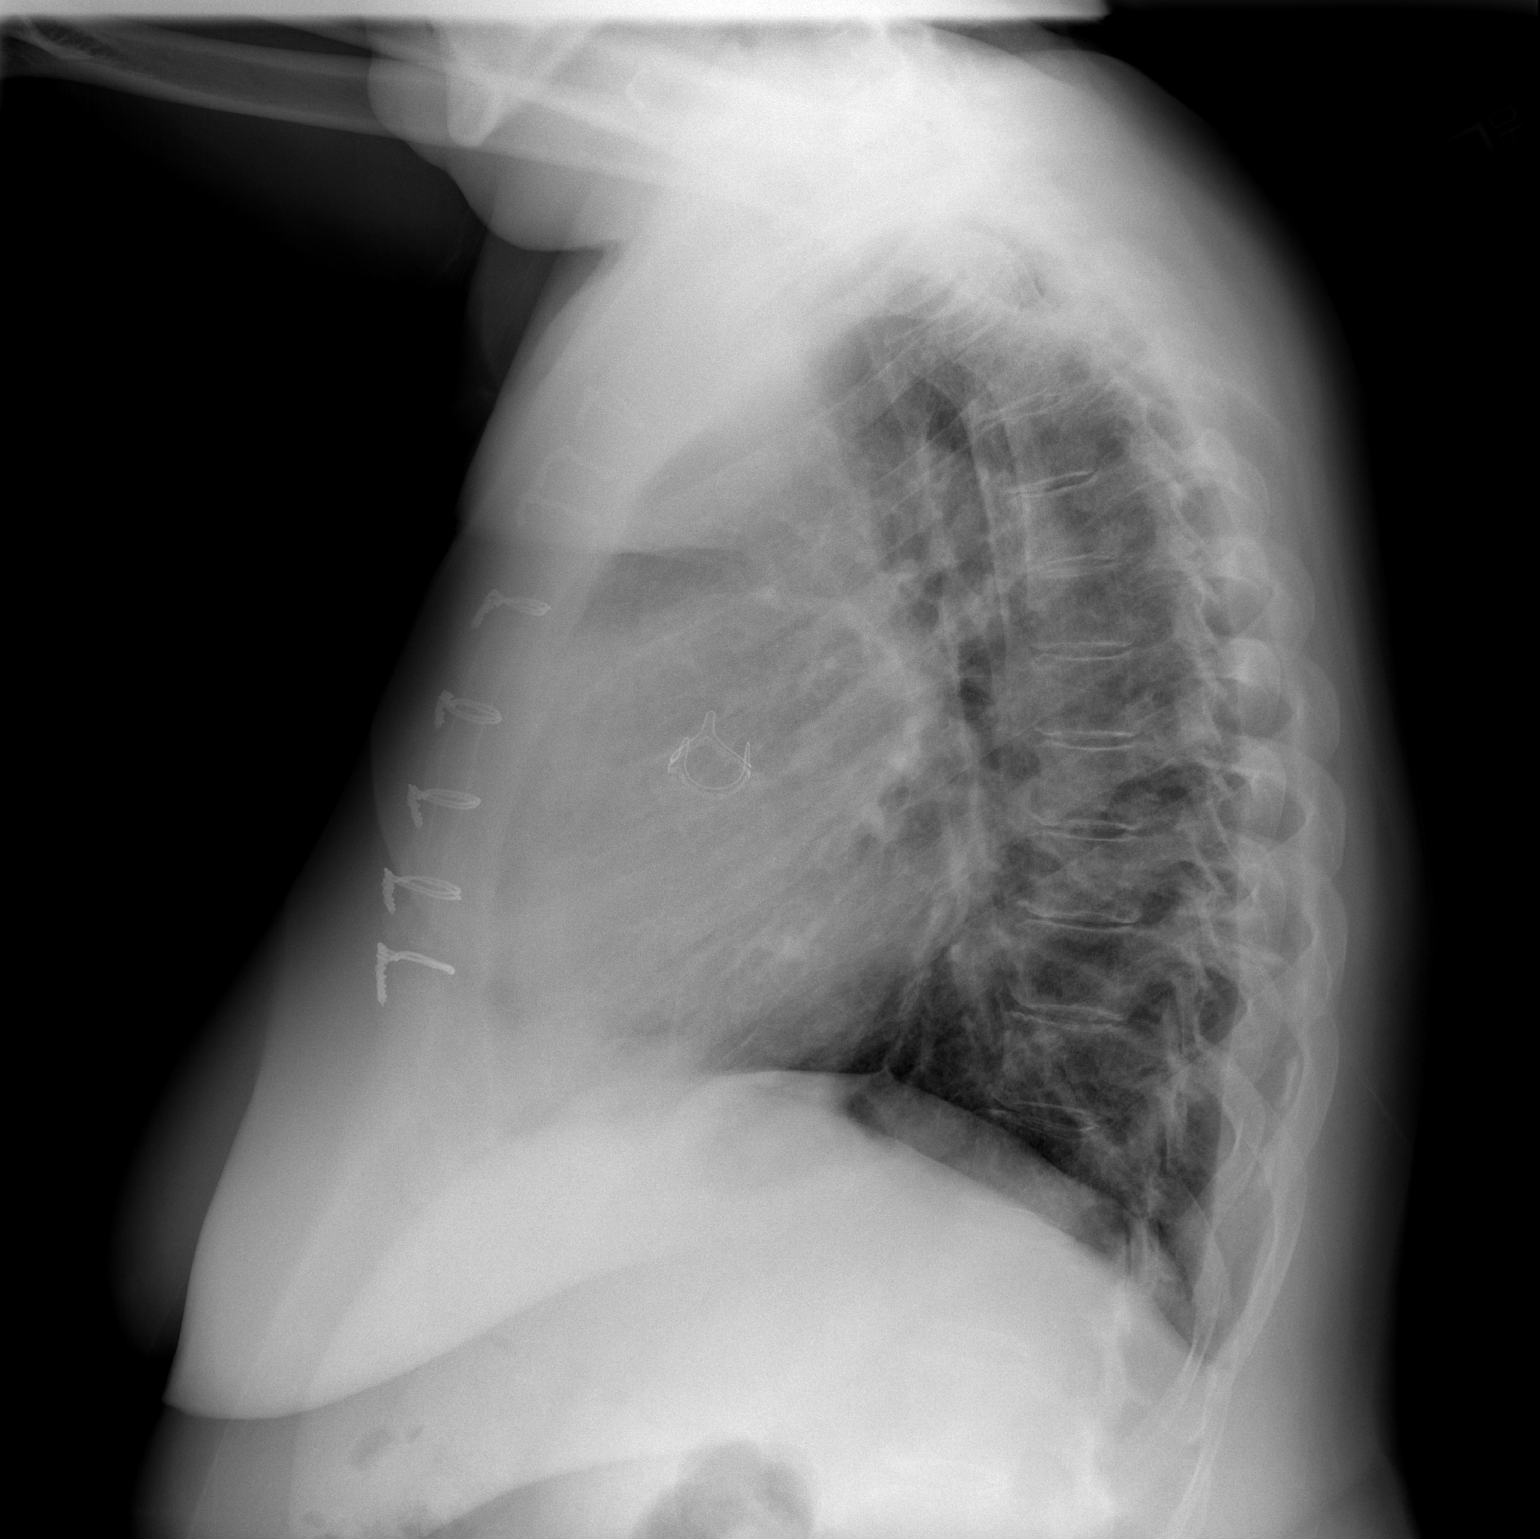

[2 of 2 positions shown; findings below may reference images not displayed]

FINDINGS: Sternotomy for aortic valve replacement.  Cardiac
silhouette mildly enlarged but stable.  Thoracic aorta tortuous and
mildly atherosclerotic, unchanged.  Hilar and mediastinal contours
otherwise unremarkable.  Minimal linear scar or atelectasis in the
right lower lobe.  Stable calcified granuloma in the left upper
lobe.  Lungs otherwise clear.  Pulmonary vascularity normal without
evidence of pulmonary edema.  No pleural effusions.  Mild
degenerative changes involving the thoracic spine.
IMPRESSION: Minimal atelectasis or scarring in the right lower lobe.  No acute
cardiopulmonary disease otherwise.  Stable mild cardiomegaly.

## 2012-09-30 ENCOUNTER — Other Ambulatory Visit: Payer: Self-pay | Admitting: *Deleted

## 2012-09-30 MED ORDER — LISINOPRIL 20 MG PO TABS: 20.0000 mg | ORAL_TABLET | Freq: Every day | ORAL | Status: DC

## 2012-09-30 NOTE — Telephone Encounter (Signed)
PER PT AND MOTHER AWARE OF APPOINTMENT AND AWARE THEY HAVE TO SHOW UP BEFORE MORE MORE REFILLS CAN BE DISPENSED. Fax Received. Refill Completed. Jennifer Bauer (R.M.A)

## 2012-12-16 ENCOUNTER — Ambulatory Visit: Payer: Self-pay | Admitting: Cardiovascular Disease

## 2014-12-18 ENCOUNTER — Ambulatory Visit: Payer: Self-pay | Admitting: Cardiovascular Disease

## 2015-02-18 ENCOUNTER — Ambulatory Visit: Payer: Self-pay | Admitting: Cardiovascular Disease

## 2016-03-25 DIAGNOSIS — L03115 Cellulitis of right lower limb: Secondary | ICD-10-CM

## 2016-03-25 DIAGNOSIS — F819 Developmental disorder of scholastic skills, unspecified: Secondary | ICD-10-CM

## 2016-03-25 DIAGNOSIS — F419 Anxiety disorder, unspecified: Secondary | ICD-10-CM

## 2016-03-25 DIAGNOSIS — E669 Obesity, unspecified: Secondary | ICD-10-CM

## 2016-03-25 DIAGNOSIS — I10 Essential (primary) hypertension: Secondary | ICD-10-CM

## 2016-03-25 DIAGNOSIS — M1710 Unilateral primary osteoarthritis, unspecified knee: Secondary | ICD-10-CM

## 2016-03-25 DIAGNOSIS — F329 Major depressive disorder, single episode, unspecified: Secondary | ICD-10-CM

## 2016-03-26 ENCOUNTER — Encounter: Payer: Self-pay | Admitting: Cardiovascular Disease

## 2016-03-26 DIAGNOSIS — I10 Essential (primary) hypertension: Secondary | ICD-10-CM | POA: Diagnosis not present

## 2016-03-26 DIAGNOSIS — L03115 Cellulitis of right lower limb: Secondary | ICD-10-CM | POA: Diagnosis not present

## 2016-03-26 DIAGNOSIS — M1710 Unilateral primary osteoarthritis, unspecified knee: Secondary | ICD-10-CM | POA: Diagnosis not present

## 2016-03-26 DIAGNOSIS — F819 Developmental disorder of scholastic skills, unspecified: Secondary | ICD-10-CM | POA: Diagnosis not present

## 2016-03-27 DIAGNOSIS — I10 Essential (primary) hypertension: Secondary | ICD-10-CM | POA: Diagnosis not present

## 2016-03-27 DIAGNOSIS — L03115 Cellulitis of right lower limb: Secondary | ICD-10-CM | POA: Diagnosis not present

## 2016-03-27 DIAGNOSIS — F819 Developmental disorder of scholastic skills, unspecified: Secondary | ICD-10-CM | POA: Diagnosis not present

## 2016-03-27 DIAGNOSIS — M1710 Unilateral primary osteoarthritis, unspecified knee: Secondary | ICD-10-CM | POA: Diagnosis not present

## 2016-03-28 DIAGNOSIS — F819 Developmental disorder of scholastic skills, unspecified: Secondary | ICD-10-CM | POA: Diagnosis not present

## 2016-03-28 DIAGNOSIS — I10 Essential (primary) hypertension: Secondary | ICD-10-CM | POA: Diagnosis not present

## 2016-03-28 DIAGNOSIS — L03115 Cellulitis of right lower limb: Secondary | ICD-10-CM | POA: Diagnosis not present

## 2016-03-28 DIAGNOSIS — M1710 Unilateral primary osteoarthritis, unspecified knee: Secondary | ICD-10-CM | POA: Diagnosis not present

## 2016-06-01 ENCOUNTER — Encounter: Payer: Self-pay | Admitting: Cardiovascular Disease

## 2016-06-01 ENCOUNTER — Telehealth: Payer: Self-pay | Admitting: Cardiovascular Disease

## 2016-06-01 NOTE — Telephone Encounter (Signed)
Mrs. Jennifer Bauer was seen by [name], PA and his medical clinic today for a regular follow-up. She is completely asymptomatic. She has a history of a bioprosthetic aortic valve replacement approximately 5 years ago.  She was found have atrial fibrillation with a rapid ventricular response.  She'll be started on Xarelto 20 mg a day. Her metoprolol XL will be increased 150 mg a day for better rate control. We will work her into see me or one of the physician assistants in the next several weeks for follow-up visit.    Kristeen MissPhilip Lateefa Crosby, MD  06/01/2016 11:05 AM    [facility] [address],  [address] [place], [place]  [address] Pager [phone] Phone: [phone]; Fax: [phone]

## 2016-06-19 DIAGNOSIS — I4891 Unspecified atrial fibrillation: Secondary | ICD-10-CM | POA: Diagnosis present

## 2016-06-21 ENCOUNTER — Telehealth: Payer: Self-pay

## 2016-06-21 NOTE — Telephone Encounter (Addendum)
Received DOD call at 11:09 AM. Forwarded call to Dr. Ladona Ridgelaylor.

## 2016-06-22 NOTE — Telephone Encounter (Signed)
I [place]. I offered to see her today but she could not get up here. I will not be available tomorrow.  She needs to come up to see me or a PA next and be scheduled for an elective cardioversion She should continue the Xarelto

## 2016-06-22 NOTE — Telephone Encounter (Signed)
Instructed MD in [place] to use clinical judgement regarding transfer of Jennifer Bauer for DCCV. If they decide not to transfer, then recommended followup with Dr. Elease Hashimotonahser at the time of discharge. GT

## 2016-06-27 ENCOUNTER — Ambulatory Visit: Payer: Self-pay | Admitting: Cardiovascular Disease

## 2016-06-28 DIAGNOSIS — R5381 Other malaise: Secondary | ICD-10-CM | POA: Diagnosis present

## 2016-07-19 NOTE — Progress Notes (Deleted)
Cardiology Office Note    Date:  07/19/2016   ID:  Jennifer, Bauer Apr 30, 1951, MRN [id number]  PCP:  Lonie Peak, PA-C  Cardiologist:  New  Chief Complaint: Afib  History of Present Illness:   Jennifer Bauer is a 66 y.o. female ***   Past Medical History:  Diagnosis Date  . Aortic insufficiency and aortic stenosis    s/p Tissue AVR with Dr. Cornelius Moras 03/2011; preop LHC with normal cors;  pre op carotid dopplers neg for ICA stenosis  . Arthritis   . Atrial fibrillation (HCC)    post op after AVR 10/12  . HTN (hypertension)   . Mitral regurgitation   . SVT (supraventricular tachycardia) (HCC)     Past Surgical History:  Procedure Laterality Date  . AORTIC VALVE REPLACEMENT  03/30/2011   #75mm [facility] Ease pericardial tissue valve  . hysterctomy    . TONSILLECTOMY      Current Medications: Prior to Admission medications   Medication Sig Start Date End Date Taking? Authorizing Provider  aspirin EC 81 MG tablet Take 81 mg by mouth daily.      Historical Provider, MD  clonazePAM (KLONOPIN) 0.5 MG tablet Take 0.5 mg by mouth 2 (two) times daily.    Historical Provider, MD  diclofenac-misoprostol (ARTHROTEC 75) 75-200 MG-MCG per tablet Take 1 tablet by mouth 2 (two) times daily.    Historical Provider, MD  FLUoxetine (PROZAC) 40 MG capsule Take 40 mg by mouth daily.    Historical Provider, MD  furosemide (LASIX) 40 MG tablet Take 2 tablets (80 mg total) by mouth daily. 09/06/11   Vesta Mixer, MD  lisinopril (PRINIVIL,ZESTRIL) 20 MG tablet Take 1 tablet (20 mg total) by mouth daily. 09/30/12   Vesta Mixer, MD  loratadine (CLARITIN) 10 MG tablet Take 10 mg by mouth daily.    Historical Provider, MD  loratadine-pseudoephedrine (CLARITIN-D 24-HOUR) 10-240 MG per 24 hr tablet Take 1 tablet by mouth daily.     Historical Provider, MD  metoprolol tartrate (LOPRESSOR) 25 MG tablet Take 25 mg by mouth 2 (two) times daily.      Historical Provider, MD  potassium  chloride SA (K-DUR,KLOR-CON) 20 MEQ tablet Take 1 tablet (20 mEq total) by mouth daily. 06/09/11   Vesta Mixer, MD    Allergies:   Acetaminophen   Social History   Social History  . Marital status: Single    Spouse name: N/A  . Number of children: 0  . Years of education: N/A   Social History Main Topics  . Smoking status: Never Smoker  . Smokeless tobacco: Not on file  . Alcohol use No  . Drug use: No  . Sexual activity: Not on file   Other Topics Concern  . Not on file   Social History Narrative  . No narrative on file     Family History:  The patient's family history includes Colon cancer in her sister; Diabetes in her father and sister; Heart attack in her father; Stroke in her father. ***  ROS:   Please see the history of present illness.    ROS All other systems reviewed and are negative.   PHYSICAL EXAM:   VS:  There were no vitals taken for this visit.   GEN: Well nourished, well developed, in no acute distress HEENT: normal Neck: no JVD, carotid bruits, or masses Cardiac: ***RRR; no murmurs, rubs, or gallops,no edema  Respiratory:  clear to auscultation bilaterally, normal work of  breathing GI: soft, nontender, nondistended, + BS MS: no deformity or atrophy Skin: warm and dry, no rash Neuro:  Alert and Oriented x 3, Strength and sensation are intact Psych: euthymic mood, full affect  Wt Readings from Last 3 Encounters:  09/06/11 250 lb 12.8 oz (113.8 kg)  07/31/11 238 lb (108 kg)  05/30/11 226 lb (102.5 kg)      Studies/Labs Reviewed:   EKG:  EKG is ordered today.  The ekg ordered today demonstrates ***  Recent Labs: No results found for requested labs within last 8760 hours.   Lipid Panel    Component Value Date/Time   CHOL 172 09/06/2011 1213   TRIG 169.0 (H) 09/06/2011 1213   HDL 51.70 09/06/2011 1213   CHOLHDL 3 09/06/2011 1213   VLDL 33.8 09/06/2011 1213   LDLCALC 87 09/06/2011 1213    Additional studies/ records that were  reviewed today include:   Echocardiogram:  Cardiac Catheterization:     ASSESSMENT & PLAN:    1. ***    Medication Adjustments/Labs and Tests Ordered: Current medicines are reviewed at length with the patient today.  Concerns regarding medicines are outlined above.  Medication changes, Labs and Tests ordered today are listed in the Patient Instructions below. There are no Patient Instructions on file for this visit.   Lorelei PontSigned, Jlynn Langille, [place]  07/19/2016 12:45 PM    [facility] [address], [place], [place]  [address] Phone: [phone]; Fax: [phone]

## 2016-07-24 ENCOUNTER — Encounter: Payer: Self-pay | Admitting: *Deleted

## 2016-07-25 ENCOUNTER — Ambulatory Visit: Payer: Self-pay | Admitting: Physician Assistant

## 2016-07-27 ENCOUNTER — Encounter: Payer: Self-pay | Admitting: Physician Assistant

## 2016-07-27 ENCOUNTER — Ambulatory Visit (INDEPENDENT_AMBULATORY_CARE_PROVIDER_SITE_OTHER): Payer: Medicaid Other | Admitting: Physician Assistant

## 2016-07-27 ENCOUNTER — Encounter (INDEPENDENT_AMBULATORY_CARE_PROVIDER_SITE_OTHER): Payer: Self-pay

## 2016-07-27 VITALS — BP 128/84 | HR 70 | Ht 67.0 in | Wt 252.0 lb

## 2016-07-27 DIAGNOSIS — L03116 Cellulitis of left lower limb: Secondary | ICD-10-CM

## 2016-07-27 DIAGNOSIS — I4891 Unspecified atrial fibrillation: Secondary | ICD-10-CM | POA: Diagnosis not present

## 2016-07-27 DIAGNOSIS — I5033 Acute on chronic diastolic (congestive) heart failure: Secondary | ICD-10-CM

## 2016-07-27 DIAGNOSIS — I10 Essential (primary) hypertension: Secondary | ICD-10-CM

## 2016-07-27 DIAGNOSIS — L03115 Cellulitis of right lower limb: Secondary | ICD-10-CM

## 2016-07-27 DIAGNOSIS — Z953 Presence of xenogenic heart valve: Secondary | ICD-10-CM

## 2016-07-27 DIAGNOSIS — R0789 Other chest pain: Secondary | ICD-10-CM

## 2016-07-27 MED ORDER — METOLAZONE 2.5 MG PO TABS: ORAL_TABLET | ORAL | 3 refills | Status: DC

## 2016-07-27 NOTE — Addendum Note (Signed)
Addended by: Daleen BoURRIE, Jaylia Pettus I on: 07/27/2016 03:02 PM   Modules accepted: Orders

## 2016-07-27 NOTE — Progress Notes (Signed)
Cardiology Office Note    Date:  07/27/2016   ID:  Jennifer, Bauer [birth year], MRN [id number]  PCP:  Lonie Peak, PA-C  Cardiologist:  New (previously followed by Dr. Elease Hashimoto - last seen 08/2011)  Chief Complaint: AFib RVR  History of Present Illness:   Jennifer Bauer is a 66 y.o. female with hx of HTN, s/p bioprosthetic aortic valve by Dr. Cornelius Moras 03/2011, post-op afib presents for afib.   She transferred from [facility] to [facility] 03/2011 with chest pain. Echo prior to tx demonstrated mod to severe AS.  LHC 03/27/11: normal LVF with EF 55%, normal cors and mod AI, severe AS with mean gradient 42 mmHg.  Carotids were neg for ICA stenosis.  She was referred for AVR with Dr. 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  Post op course was complicated by AFib that converted to NSR with amiodarone.    He was doing well on cardiac stand point when seen last  by Dr. Elease Hashimoto 08/2011. Echocardiogram 03/2016 shows left ventricular function of 55-60%, mild LVH and normal functioning aortic valve.  She was noticed to be in A. fib with RVR by PCP during her follow-up 06/01/17. She was asymptomatic. Case was discussed with Dr. Elease Hashimoto via  Telephone. Started on Xarelto 20 mg a day. Her metoprolol XL will be increased 150 mg a day for better rate control.  Was admitted [facility] 06/18/16-06/28/16 for lower extremity 80s with severe edema. Treated with antibiotics. Hospital course complicated by A. fib RVR. Discussed with Dr. Orpah Cobb again and her beta blocker and calcium channel blocker increased. Plan for rate control and possible outpatient cardioversion. Echo during admission 06/26/16 shows  . Mitral annular calcification  Dilated left atrium - mild to moderate  Aortic sclerosis  Normal right ventricular systolic function  Tricuspid regurgitation - mild to moderate  Diastolic dysfunction - grade II (elevated filling pressures)  Dilated right atrium - mild  Mildly elevated right  atrial pressure  Elevated pulmonary artery systolic pressure - mild to moderate  Left ventricular hypertrophy - mild  Normal left ventricular systolic function, ejection fraction 55 to 60  Here today for follow up. 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 She walks with cane and walker at home. She has missed a few doses of Xarelto after discharge. Now she is continuously taking Xarelto since January 23 after discussion with the primary. She initially felt good after discharge however for the past 2 weeks he has again noted lower extremity edema with worsening shortness of breath. Dyspnea gets worse with exertion along with intermittent chest pain. She is poor historian. Unable to describe the character of her chest pain. However, it relieves with rest. He isn't denies any orthopnea, PND, syncope, melena or blood in her stool or urine. Admits to having dizziness. Not elevating her legs. No excess salt. She has not checked her weight  at home. However, at PCP offfice her weight was 236 Lb. Weight today 252 LB  Past Medical History:  Diagnosis Date  . Aortic insufficiency and aortic stenosis    s/p Tissue AVR with Dr. Cornelius Moras 03/2011; preop LHC with normal cors;  pre op carotid dopplers neg for ICA stenosis  . Arthritis   . Atrial fibrillation (HCC)    post op after AVR 10/12  . HTN (hypertension)   . Mitral regurgitation   . SVT (supraventricular tachycardia) (HCC)     Past  Surgical History:  Procedure Laterality Date  . AORTIC VALVE REPLACEMENT  03/30/2011   [facility] pericardial tissue valve  . hysterctomy    . TONSILLECTOMY      Current Medications:  Prior to Admission medications   Medication Sig Start Date End Date Taking? Authorizing Provider  citalopram (CELEXA) 20 MG tablet Take 20 mg by mouth daily.   Yes Historical Provider, MD  clonazePAM (KLONOPIN) 0.5 MG tablet Take 0.5 mg by mouth 2 (two) times daily.    Yes Historical Provider, MD  diltiazem (CARDIZEM CD) 360 MG 24 hr capsule Take 360 mg by mouth daily. 07/05/16 07/05/17 Yes Historical Provider, MD  furosemide (LASIX) 80 MG tablet Take 80 mg by mouth 2 (two) times daily.   Yes Historical Provider, MD  metoprolol (TOPROL-XL) 200 MG 24 hr tablet Take 200 mg by mouth daily.   Yes Historical Provider, MD  metoprolol tartrate (LOPRESSOR) 25 MG tablet Take 25 mg by mouth 2 (two) times daily.     Yes Historical Provider, MD  potassium chloride (KLOR-CON) 20 MEQ packet Take 20 mEq by mouth 2 (two) times daily.   Yes Historical Provider, MD  rivaroxaban (XARELTO) 20 MG TABS tablet Take 20 mg by mouth daily.   Yes Historical Provider, MD  traMADol (ULTRAM) 50 MG tablet Take 50 mg by mouth 2 (two) times daily.   Yes Historical Provider, MD    Allergies:   Acetaminophen   Social History   Social History  . Marital status: Single    Spouse name: N/A  . Number of children: 0  . Years of education: N/A   Social History Main Topics  . Smoking status: Never Smoker  . Smokeless tobacco: Never Used  . Alcohol use No  . Drug use: No  . Sexual activity: Not Asked   Other Topics Concern  . None   Social History Narrative  . None     Family History:  The patient's family history includes Colon cancer in her sister; Diabetes in her father and sister; Heart attack in her father; Stroke in her father.   ROS:   Please see the history of present illness.    ROS All other systems reviewed and are negative.   PHYSICAL EXAM:   VS:  BP 128/84   Pulse 70   Ht 5\' 7"  (1.702 m)   Wt 252 lb (114.3 kg)   BMI 39.47 kg/m    GEN: Well nourished, well developed, obese female in no acute distress  HEENT: normal  Neck: no JVD, carotid bruits, or masses Cardiac: Ir IR ; no murmurs, rubs, or gallops, 3+ edema bilaterally with erythema.  Respiratory:  clear to auscultation bilaterally, normal work of breathing GI: soft, nontender, nondistended, + BS MS: no  deformity or atrophy  Skin: warm and dry, no rash Neuro:  Alert and Oriented x 3, Strength and sensation are intact Psych: euthymic mood, full affect  Wt Readings from Last 3 Encounters:  07/27/16 252 lb (114.3 kg)  09/06/11 250 lb 12.8 oz (113.8 kg)  07/31/11 238 lb (108 kg)      Studies/Labs Reviewed:   EKG:  EKG is ordered today.  The ekg ordered today demonstrates afib at rate of 70 bpm.   Recent Labs: No results found for requested labs within last 8760 hours.   Lipid Panel    Component Value Date/Time   CHOL 172 09/06/2011 1213   TRIG 169.0 (H) 09/06/2011 1213   HDL 51.70 09/06/2011 1213  CHOLHDL 3 09/06/2011 1213   VLDL 33.8 09/06/2011 1213   LDLCALC 87 09/06/2011 1213    Additional studies/ records that were reviewed today include:  As above    ASSESSMENT & PLAN:    1. Persistent atrial fibrillation  - First episode occurred postoperatively after valve replacement. Seen she is in afib since 05/2016. Uninterrupted anticoagulation since Jan 23rd 2018. Rate controlled. Will schedule for DCCV after 3 weeks of uninterrupted anticoagulation. No bleeding issue.   The risks and benefits have been explained.  Patient is willing to proceed.   2. s/p bioprosthetic aortic valve by Dr. Cornelius Moras 03/2011 - Normal valve function on recent echo. Normal Exam.   3. HTN - Stable and well controlled on current regimen.   4. Bilateral lower extremity cellulitis - Noted erythema again. Advised to f/u with PCP for possible another round of abx. Her symptoms is concerning of chronic venous status.   5. Acute on chronic diastolic CHF - Normal EF on recent echo. She has gained 16lb in past 2  Weeks. Noted dyspnea. Sleeps in recliner chronically. Lungs clear today. Will add Metolazone 2.5mg  MWF with extra Kdur on day for metolazone. Continue Lasix 80mg  BID.   *Weigh yourself on the same scale at same time of day and keep a log. *Report weight gain of > 3 lbs in 1 day or 5 lbs  over the course of a week and/or symptoms of excess fluid (shortness of breath, difficulty lying flat, swelling, poor appetite, abdominal fullness/bloating, etc) to your doctor immediately. *Avoid foods that are high in sodium (processed, pre-packaged/canned goods, fast foods, etc). *Please attend all scheduled and reccommended follow up appointments   5. Chest pain - Seems Atypical. Will continue to monitor for now.   Discussed plan with DOD Dr. Eden Emms. She will f/u with PCP with lab work.     Medication Adjustments/Labs and Tests Ordered: Current medicines are reviewed at length with the patient today.  Concerns regarding medicines are outlined above.  Medication changes, Labs and Tests ordered today are listed in the Patient Instructions below. Patient Instructions  Medication Instructions:  START metolazone (xaroxolyn) 2.5 MG (1 tablet) on Mondays, Wednesdays, and Fridays only. Take the metolazone 30 minutes before taking the furosemide (lasix).  TAKE an extra 20 mg of potassium on Mondays, Wednesdays, and Fridays when taking the metolazone.   Labwork: Your physician recommends that you have labs drawn at your Primary Care Provider. Labs needed: BMET, CBC, PT/INR.   Testing/Procedures: Your physician has recommended that you have a Cardioversion (DCCV). You are already scheduled for 08/03/16 at 1:00PM. You need to arrive at 11:30 Am. Electrical Cardioversion uses a jolt of electricity to your heart either through paddles or wired patches attached to your chest. This is a controlled, usually prescheduled, procedure. Defibrillation is done under light anesthesia in the hospital, and you usually go home the day of the procedure. This is done to get your heart back into a normal rhythm. You are not awake for the procedure. Please see the instruction sheet given to you today.    Follow-Up: We will contact you after cardioversion regarding on when to follow up.  Any Other Special  Instructions Will Be Listed Below (If Applicable).     If you need a refill on your cardiac medications before your next appointment, please call your pharmacy.     Jennifer Bauer, [place]  07/27/2016 2:47 PM    [facility] [address] [place], [place], [place]  [address] Phone: [phone]; Fax: [phone]

## 2016-07-27 NOTE — Patient Instructions (Signed)
Medication Instructions:  START metolazone (xaroxolyn) 2.5 MG (1 tablet) on Mondays, Wednesdays, and Fridays only. Take the metolazone 30 minutes before taking the furosemide (lasix).  TAKE an extra 20 mg of potassium on Mondays, Wednesdays, and Fridays when taking the metolazone.   Labwork: Your physician recommends that you have labs drawn at your Primary Care Provider. Labs needed: BMET, CBC, PT/INR.   Testing/Procedures: Your physician has recommended that you have a Cardioversion (DCCV). You are already scheduled for 08/03/16 at 1:00PM. You need to arrive at 11:30 Am. Electrical Cardioversion uses a jolt of electricity to your heart either through paddles or wired patches attached to your chest. This is a controlled, usually prescheduled, procedure. Defibrillation is done under light anesthesia in the hospital, and you usually go home the day of the procedure. This is done to get your heart back into a normal rhythm. You are not awake for the procedure. Please see the instruction sheet given to you today.    Follow-Up: We will contact you after cardioversion regarding on when to follow up.  Any Other Special Instructions Will Be Listed Below (If Applicable).     If you need a refill on your cardiac medications before your next appointment, please call your pharmacy.

## 2016-07-28 ENCOUNTER — Telehealth: Payer: Self-pay

## 2016-07-28 NOTE — Telephone Encounter (Signed)
The patient is scheduled for a follow up appointment at her PCP, Arlyn LeakNathan Conroy's office on 08/01/16. She is suppose to have her labs drawn (BMP, CBC, PT/INR) while she is there. We will use these labs for her cardioversion scheduled for 08/03/16. I spoke with Harrold DonathNathan Conroy's RN Janine LimboKiara and she has been made aware and is going to fax over the lab results to the attention of [employer]ennifer Witty.

## 2016-08-01 LAB — PROTIME-INR: INR: 1.2 — AB (ref 0.9–1.1)

## 2016-08-02 ENCOUNTER — Telehealth: Payer: Self-pay | Admitting: *Deleted

## 2016-08-02 NOTE — Telephone Encounter (Signed)
Error

## 2016-08-03 ENCOUNTER — Encounter (HOSPITAL_COMMUNITY): Payer: Self-pay | Admitting: Certified Registered Nurse Anesthetist

## 2016-08-03 ENCOUNTER — Ambulatory Visit (HOSPITAL_COMMUNITY): Admission: RE | Admit: 2016-08-03 | Payer: Medicaid Other | Source: Ambulatory Visit | Admitting: Cardiovascular Disease

## 2016-08-03 ENCOUNTER — Telehealth: Payer: Self-pay | Admitting: *Deleted

## 2016-08-03 ENCOUNTER — Encounter (HOSPITAL_COMMUNITY): Admission: RE | Payer: Self-pay | Source: Ambulatory Visit

## 2016-08-03 SURGERY — CARDIOVERSION
Anesthesia: General

## 2016-08-03 NOTE — Telephone Encounter (Signed)
Pt was scheduled for cardioversion with DR. Nahser today. Pt called to cancelled procedure due to her mother bein sick in the hospital since yesterday. Pt will call again to rescheduled procedure.

## 2016-08-04 ENCOUNTER — Encounter: Payer: Self-pay | Admitting: *Deleted

## 2016-08-04 ENCOUNTER — Telehealth: Payer: Self-pay | Admitting: Cardiovascular Disease

## 2016-08-04 NOTE — Telephone Encounter (Signed)
New Message     Per pt sister they needs to set up appt to have her sisters heart shocked, preferrably on a Tuesday or Thursday,  She has it set up for last Thursday but the pt mother passed away and they could not come in.

## 2016-08-04 NOTE — Telephone Encounter (Signed)
BMET/CBC/PT done at PCP  08/01/16 scanned in Epic.

## 2016-08-04 NOTE — Telephone Encounter (Signed)
April requests to have cardioversion rescheduled to next Tuesday 2/20 or next Thursday 2/22. April advised cardioversion has been scheduled for Thursday 08/10/16 12 noon with Dr Jens Somrenshaw, case number [id number]. April advised pt should not eat or drink after midnight the night before, can take medications with a sip of water, pt would not be able to drive home. I confirmed with April that pt has not missed any doses of Xarelto.

## 2016-08-04 NOTE — Telephone Encounter (Signed)
In Basket message to prior authorization. 

## 2016-08-10 ENCOUNTER — Encounter (HOSPITAL_COMMUNITY)
Admission: RE | Disposition: A | Discharge status: Home / Self Care | Payer: Self-pay | Source: Ambulatory Visit | Attending: Cardiology

## 2016-08-10 ENCOUNTER — Ambulatory Visit (HOSPITAL_COMMUNITY): Payer: Medicaid Other | Admitting: Certified Registered"

## 2016-08-10 ENCOUNTER — Encounter (HOSPITAL_COMMUNITY): Payer: Self-pay | Admitting: Certified Registered"

## 2016-08-10 ENCOUNTER — Other Ambulatory Visit: Payer: Self-pay | Admitting: Physician Assistant

## 2016-08-10 ENCOUNTER — Telehealth: Payer: Self-pay | Admitting: Physician Assistant

## 2016-08-10 ENCOUNTER — Ambulatory Visit (HOSPITAL_COMMUNITY)
Admission: RE | Admit: 2016-08-10 | Discharge: 2016-08-10 | Disposition: A | Discharge status: Home / Self Care | Payer: Medicaid Other | Source: Ambulatory Visit | Attending: Cardiology | Admitting: Cardiology

## 2016-08-10 DIAGNOSIS — I481 Persistent atrial fibrillation: Secondary | ICD-10-CM | POA: Insufficient documentation

## 2016-08-10 DIAGNOSIS — I110 Hypertensive heart disease with heart failure: Secondary | ICD-10-CM | POA: Diagnosis not present

## 2016-08-10 DIAGNOSIS — Z5329 Procedure and treatment not carried out because of patient's decision for other reasons: Secondary | ICD-10-CM | POA: Diagnosis not present

## 2016-08-10 DIAGNOSIS — I5032 Chronic diastolic (congestive) heart failure: Secondary | ICD-10-CM | POA: Diagnosis not present

## 2016-08-10 DIAGNOSIS — M1990 Unspecified osteoarthritis, unspecified site: Secondary | ICD-10-CM | POA: Insufficient documentation

## 2016-08-10 DIAGNOSIS — E876 Hypokalemia: Secondary | ICD-10-CM

## 2016-08-10 DIAGNOSIS — Z79899 Other long term (current) drug therapy: Secondary | ICD-10-CM | POA: Diagnosis not present

## 2016-08-10 DIAGNOSIS — Z6839 Body mass index (BMI) 39.0-39.9, adult: Secondary | ICD-10-CM | POA: Diagnosis not present

## 2016-08-10 DIAGNOSIS — Z953 Presence of xenogenic heart valve: Secondary | ICD-10-CM | POA: Diagnosis not present

## 2016-08-10 DIAGNOSIS — Z539 Procedure and treatment not carried out, unspecified reason: Secondary | ICD-10-CM | POA: Diagnosis not present

## 2016-08-10 DIAGNOSIS — E342 Ectopic hormone secretion, not elsewhere classified: Secondary | ICD-10-CM

## 2016-08-10 LAB — POCT I-STAT 4, (NA,K, GLUC, HGB,HCT)
Glucose, Bld: 138 mg/dL — ABNORMAL HIGH (ref 65–99)
HCT: 40 % (ref 36.0–46.0)
Hemoglobin: 13.6 g/dL (ref 12.0–15.0)
Potassium: 2.3 mmol/L — CL (ref 3.5–5.1)
SODIUM: 138 mmol/L (ref 135–145)

## 2016-08-10 LAB — BASIC METABOLIC PANEL
ANION GAP: 15 (ref 5–15)
BUN: 39 mg/dL — ABNORMAL HIGH (ref 6–20)
CO2: 36 mmol/L — AB (ref 22–32)
Calcium: 9.9 mg/dL (ref 8.9–10.3)
Chloride: 88 mmol/L — ABNORMAL LOW (ref 101–111)
Creatinine, Ser: 1.6 mg/dL — ABNORMAL HIGH (ref 0.44–1.00)
GFR calc Af Amer: 38 mL/min — ABNORMAL LOW (ref >60)
GFR calc non Af Amer: 33 mL/min — ABNORMAL LOW (ref >60)
GLUCOSE: 126 mg/dL — AB (ref 65–99)
POTASSIUM: 2.2 mmol/L — AB (ref 3.5–5.1)
Sodium: 139 mmol/L (ref 135–145)

## 2016-08-10 SURGERY — CANCELLED PROCEDURE
Anesthesia: General

## 2016-08-10 MED ORDER — SODIUM CHLORIDE 0.9% FLUSH: 3.0000 mL | Freq: Two times a day (BID) | INTRAVENOUS | Status: DC

## 2016-08-10 MED ORDER — POTASSIUM CHLORIDE 20 MEQ PO PACK: PACK | ORAL | 1 refills | Status: DC

## 2016-08-10 MED ORDER — POTASSIUM CHLORIDE CRYS ER 20 MEQ PO TBCR
40.0000 meq | EXTENDED_RELEASE_TABLET | Freq: Once | ORAL | Status: AC
  Administered 2016-08-10: 40 meq via ORAL
  Filled 2016-08-10: qty 2

## 2016-08-10 MED ORDER — SODIUM CHLORIDE 0.9 % IV SOLN: 250.0000 mL | INTRAVENOUS | Status: DC

## 2016-08-10 MED ORDER — SODIUM CHLORIDE 0.9% FLUSH: 3.0000 mL | INTRAVENOUS | Status: DC | PRN

## 2016-08-10 NOTE — H&P (Addendum)
07/27/2016 1:30 PM  Office Visit  MRN:  [id number]  Description: Female DOB: 18-Jul-1950 Provider: Manson Passey, PA Department: [address] [facility]  Vitals   BP  128/84   Pulse  70   Ht  5\' 7"  (1.702 m)   Wt  252 lb (114.3 kg)   BMI  39.47 kg/m      Vitals History  Progress Notes   Manson Passey, PA at 07/27/2016 1:30 PM   Status: Signed  Expand All Collapse All      Cardiology Office Note    Date:  07/27/2016   ID:  Payson, Crumby Jun 15, 1951, MRN [id number]  PCP:  Lonie Peak, PA-C   Cardiologist:  New (previously followed by Dr. Elease Hashimoto - last seen 08/2011)  Chief Complaint: AFib RVR  History of Present Illness:   Jennifer Bauer is a 66 y.o. female with hx of HTN, s/p bioprosthetic aortic valve by Dr. Cornelius Moras 03/2011, post-op afib presents for afib.   [facility] to [facility] 03/2011 with chest pain. Echo prior to tx demonstrated mod to severe AS. LHC 03/27/11: normal LVF with EF 55%, normal cors and mod AI, severe AS with mean gradient 42 mmHg. Carotids were neg for ICA stenosis. She was referred for AVR with Dr. 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 Post op course was complicated by AFib that converted to NSR with amiodarone.   He was doing well on cardiac stand point when seen last  by Dr. Elease Hashimoto 08/2011. Echocardiogram 03/2016 shows left ventricular function of 55-60%, mild LVH and normal functioning aortic valve.  She was noticed to be in A. fib with RVR by PCP during her follow-up 06/01/17. She was asymptomatic. Case was discussed with Dr. Elease Hashimoto via  Telephone. Started on Xarelto 20 mg a day. Her metoprolol XL will be increased 150 mga day for better rate control.  Was admitted [facility] 06/18/16-06/28/16 for lower extremity 80s with severe edema. Treated with antibiotics. Hospital course complicated by A. fib RVR. Discussed with Dr. Orpah Cobb again and her beta blocker and calcium channel blocker  increased. Plan for rate control and possible outpatient cardioversion. Echo during admission 06/26/16 shows  . Mitral annular calcification  Dilated left atrium - mild to moderate  Aortic sclerosis  Normal right ventricular systolic function  Tricuspid regurgitation - mild to moderate  Diastolic dysfunction - grade II (elevated filling pressures)  Dilated right atrium - mild  Mildly elevated right atrial pressure  Elevated pulmonary artery systolic pressure - mild to moderate  Left ventricular hypertrophy - mild  Normal left ventricular systolic function, ejection fraction 55 to 60  Here today for follow up. 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 She walks with cane and walker at home. She has missed a few doses of Xarelto after discharge. Now she is continuously taking Xarelto since January 23 after discussion with the primary. She initially felt good after discharge however for the past 2 weeks he has again noted lower extremity edema with worsening shortness of breath. Dyspnea gets worse with exertion along with intermittent chest pain. She is poor historian. Unable to describe the character of her chest pain. However, it relieves with rest. He isn't denies any orthopnea, PND, syncope, melena or blood in her stool or urine. Admits to having dizziness. Not elevating her legs. No excess salt. She has not checked her weight  at home. However, at PCP offfice her  weight was 236 Lb. Weight today 252 LB      Past Medical History:  Diagnosis Date  . Aortic insufficiency and aortic stenosis    s/p Tissue AVR with Dr. Cornelius Moraswen 03/2011; preop LHC with normal cors;  pre op carotid dopplers neg for ICA stenosis  . Arthritis   . Atrial fibrillation (HCC)    post op after AVR 10/12  . HTN (hypertension)   . Mitral regurgitation   . SVT (supraventricular tachycardia) (HCC)          Past Surgical History:  Procedure Laterality  Date  . AORTIC VALVE REPLACEMENT  03/30/2011   [facility] pericardial tissue valve  . hysterctomy    . TONSILLECTOMY      Current Medications:         Prior to Admission medications   Medication Sig Start Date End Date Taking? Authorizing Provider  citalopram (CELEXA) 20 MG tablet Take 20 mg by mouth daily.   Yes Historical Provider, MD  clonazePAM (KLONOPIN) 0.5 MG tablet Take 0.5 mg by mouth 2 (two) times daily.   Yes Historical Provider, MD  diltiazem (CARDIZEM CD) 360 MG 24 hr capsule Take 360 mg by mouth daily. 07/05/16 07/05/17 Yes Historical Provider, MD  furosemide (LASIX) 80 MG tablet Take 80 mg by mouth 2 (two) times daily.   Yes Historical Provider, MD  metoprolol (TOPROL-XL) 200 MG 24 hr tablet Take 200 mg by mouth daily.   Yes Historical Provider, MD  metoprolol tartrate (LOPRESSOR) 25 MG tablet Take 25 mg by mouth 2 (two) times daily.     Yes Historical Provider, MD  potassium chloride (KLOR-CON) 20 MEQ packet Take 20 mEq by mouth 2 (two) times daily.   Yes Historical Provider, MD  rivaroxaban (XARELTO) 20 MG TABS tablet Take 20 mg by mouth daily.   Yes Historical Provider, MD  traMADol (ULTRAM) 50 MG tablet Take 50 mg by mouth 2 (two) times daily.   Yes Historical Provider, MD    Allergies:   Acetaminophen   Social History        Social History  . Marital status: Single    Spouse name: N/A  . Number of children: 0  . Years of education: N/A       Social History Main Topics  . Smoking status: Never Smoker  . Smokeless tobacco: Never Used  . Alcohol use No  . Drug use: No  . Sexual activity: Not Asked       Other Topics Concern  . None      Social History Narrative  . None     Family History:  The patient's family history includes Colon cancer in her sister; Diabetes in her father and sister; Heart attack in her father; Stroke in her father.   ROS:   Please see the history of present illness.    ROS  All other systems reviewed and are negative.   PHYSICAL EXAM:   VS:  BP 128/84   Pulse 70   Ht 5\' 7"  (1.702 m)   Wt 252 lb (114.3 kg)   BMI 39.47 kg/m    GEN: Well nourished, well developed, obese female in no acute distress  HEENT: normal  Neck: no JVD, carotid bruits, or masses Cardiac: Ir IR ; no murmurs, rubs, or gallops, 3+ edema bilaterally with erythema.  Respiratory:  clear to auscultation bilaterally, normal work of breathing GI: soft, nontender, nondistended, + BS MS: no deformity or atrophy  Skin: warm and dry,  no rash Neuro:  Alert and Oriented x 3, Strength and sensation are intact Psych: euthymic mood, full affect     Wt Readings from Last 3 Encounters:  07/27/16 252 lb (114.3 kg)  09/06/11 250 lb 12.8 oz (113.8 kg)  07/31/11 238 lb (108 kg)      Studies/Labs Reviewed:   EKG:  EKG is ordered today.  The ekg ordered today demonstrates afib at rate of 70 bpm.   Recent Labs: No results found for requested labs within last 8760 hours.   Lipid Panel Labs (Brief)          Component Value Date/Time   CHOL 172 09/06/2011 1213   TRIG 169.0 (H) 09/06/2011 1213   HDL 51.70 09/06/2011 1213   CHOLHDL 3 09/06/2011 1213   VLDL 33.8 09/06/2011 1213   LDLCALC 87 09/06/2011 1213      Additional studies/ records that were reviewed today include:  As above    ASSESSMENT & PLAN:    1. Persistent atrial fibrillation  - First episode occurred postoperatively after valve replacement. Seen she is in afib since 05/2016. Uninterrupted anticoagulation since Jan 23rd 2018. Rate controlled. Will schedule for DCCV after 3 weeks of uninterrupted anticoagulation. No bleeding issue.   The risks and benefits have been explained.  Patient is willing to proceed.   2. s/p bioprosthetic aortic valve by Dr. Cornelius Moras 03/2011 - Normal valve function on recent echo. Normal Exam.   3. HTN - Stable and well controlled on current regimen.   4. Bilateral lower  extremity cellulitis - Noted erythema again. Advised to f/u with PCP for possible another round of abx. Her symptoms is concerning of chronic venous status.   5. Acute on chronic diastolic CHF - Normal EF on recent echo. She has gained 16lb in past 2  Weeks. Noted dyspnea. Sleeps in recliner chronically. Lungs clear today. Will add Metolazone 2.5mg  MWF with extra Kdur on day for metolazone. Continue Lasix 80mg  BID.   *Weigh yourself on the same scale at same time of day and keep a log. *Report weight gain of > 3 lbs in 1 day or 5 lbs over the course of a week and/or symptoms of excess fluid (shortness of breath, difficulty lying flat, swelling, poor appetite, abdominal fullness/bloating, etc) to your doctor immediately. *Avoid foods that are high in sodium (processed, pre-packaged/canned goods, fast foods, etc). *Please attend all scheduled and reccommended follow up appointments   5. Chest pain - Seems Atypical. Will continue to monitor for now.   Discussed plan with DOD Dr. Eden Emms. She will f/u with PCP with lab work.     Medication Adjustments/Labs and Tests Ordered: Current medicines are reviewed at length with the patient today.  Concerns regarding medicines are outlined above.  Medication changes, Labs and Tests ordered today are listed in the Patient Instructions below. Patient Instructions  Medication Instructions:  START metolazone (xaroxolyn) 2.5 MG (1 tablet) on Mondays, Wednesdays, and Fridays only. Take the metolazone 30 minutes before taking the furosemide (lasix).  TAKE an extra 20 mg of potassium on Mondays, Wednesdays, and Fridays when taking the metolazone.   Labwork: Your physician recommends that you have labs drawn at your Primary Care Provider. Labs needed: BMET, CBC, PT/INR.   Testing/Procedures: Your physician has recommended that you have a Cardioversion (DCCV). You are already scheduled for 08/03/16 at 1:00PM. You need to arrive at 11:30 Am.  Electrical Cardioversion uses a jolt of electricity to your heart either through paddles or wired patches  attached to your chest. This is a controlled, usually prescheduled, procedure. Defibrillation is done under light anesthesia in the hospital, and you usually go home the day of the procedure. This is done to get your heart back into a normal rhythm. You are not awake for the procedure. Please see the instruction sheet given to you today.    Follow-Up: We will contact you after cardioversion regarding on when to follow up.  Any Other Special Instructions Will Be Listed Below (If Applicable).     If you need a refill on your cardiac medications before your next appointment, please call your pharmacy.     Lorelei Pont, [place]  07/27/2016 2:47 PM    [facility] [address] [place], [place], [place]  [address] Phone: [phone]; Fax: [phone]      Pt presented for DCCV; labs ordered by anesthesia reveal K 2.3 and they canceled procedure; meds reviewed; pt taking lasix 80 mg BID and metolazone 2.5 MWF (initiated recently); also taking Kcl 20 meq BID. Will give KCL 40 meq po now and repeat in 4 hours; change kdur to 40 meq in AM and 20 q pm; check BMET in AM with results to Dr Elease Hashimoto. Will need to reschedule DCCV when K improves. Will forward to Dr Elease Hashimoto. Olga Millers Rechecked BMET; K 2.2 and Cr 1.6; plan to DC metolazone; she took Kdur 40 meq this AM and received another 40 here; will take another 40 this PM; resume regular dose of kdur 40 BID in AM; bmet tomorrow AM; ECG shows afib with PVC, LVH, nonspecific ST changes. Olga Millers

## 2016-08-10 NOTE — Progress Notes (Signed)
CRITICAL VALUE ALERT  Critical value received:  Postassium   Date of notification:  08/10/2016   Time of notification:  1315   Critical value read back:Yes.    Nurse who received alert:  Will BonnetKatie Yarethzi Branan, RN   MD notified (1st page):  Jens Somrenshaw, MD   Time of first page:  1316   MD notified (2nd page):  Time of second page:  Responding MD:  Olga MillersBrian Crenshaw, MD   Time MD responded:  [phone]

## 2016-08-10 NOTE — Anesthesia Preprocedure Evaluation (Deleted)
Anesthesia Evaluation  Patient identified by MRN, date of birth, ID band  Reviewed: Allergy & Precautions, H&P , NPO status , Patient's Chart, lab work & pertinent test results, reviewed documented beta blocker date and time   Airway Mallampati: III  TM Distance: >3 FB Neck ROM: Full    Dental no notable dental hx. (+) Upper Dentures, Lower Dentures, Dental Advisory Given   Pulmonary neg pulmonary ROS,    Pulmonary exam normal breath sounds clear to auscultation       Cardiovascular hypertension, Pt. on medications and Pt. on home beta blockers  Rhythm:Irregular Rate:Normal     Neuro/Psych negative neurological ROS  negative psych ROS   GI/Hepatic negative GI ROS, Neg liver ROS,   Endo/Other  Morbid obesity  Renal/GU negative Renal ROS  negative genitourinary   Musculoskeletal  (+) Arthritis , Osteoarthritis,    Abdominal   Peds  Hematology negative hematology ROS (+)   Anesthesia Other Findings   Reproductive/Obstetrics negative OB ROS                           Anesthesia Physical Anesthesia Plan  ASA: III  Anesthesia Plan: General   Post-op Pain Management:    Induction: Intravenous  Airway Management Planned: Mask  Additional Equipment:   Intra-op Plan:   Post-operative Plan:   Informed Consent: I have reviewed the patients History and Physical, chart, labs and discussed the procedure including the risks, benefits and alternatives for the proposed anesthesia with the patient or authorized representative who has indicated his/her understanding and acceptance.   Dental advisory given  Plan Discussed with: CRNA  Anesthesia Plan Comments:         Anesthesia Quick Evaluation

## 2016-08-10 NOTE — OR Nursing (Signed)
Potassium 2.3 so Cardioversion cancelled

## 2016-08-10 NOTE — Telephone Encounter (Signed)
I was asked by Dr. Jens Somrenshaw to help assist with this patient's care. He requested specific potassium repletion and follow-up labs on this patient. The patient had presented for DCCV today and was found to have a low potassium of 2.2 and elevated creatinine of 1.6. She is hemodynamically stable without acute symptoms related to these.  It was initially thought her home dose was 20meq BID so a prescription for 40meq QAM/5820meq QPM was sent in. Then the patient told us she had actually been taking 40meq BID recently. (I called [facility] to cancel the erroneous rx.) Per Dr. Jens Somrenshaw she was ultimately given an extra 40meq here and advised to take another 40meq when she got home, and to continue 40meq BID tomorrow pending further instructions. Metolazone was discontinued.   Dr. Jens Somrenshaw recommends BMET tomorrow AM to recheck K, Cr. The patient told us she needed to have lab done locally in [place]. However, I contacted her PCP's office per her request and they informed me they were unable to do stat labwork as they send it out to [facility]. There is a [facility] in [place] she could go to but this is 45 minutes from the patient's house. I called and spoke with the patient's sister who is presently with the patient on the way home. I offered for her to come to [place] (25 minus away) or [address] [facility] [facility] ([address]). [facility]. Importance of f/u was reinforced. Lab appointment was made by Trish. I've put in the order for a STAT BMET. I also [facility] that these stat labs are typically called to the Triage nurses, and so I requested they page me tomorrow upon receipt of the result. This will also come to my box but I just wanted to make sure we are actively listening out for it as well. Will await the result tomorrow to make further decisions regarding potassium repletion.  Lashina Milles PA-C

## 2016-08-11 ENCOUNTER — Other Ambulatory Visit: Payer: Self-pay

## 2016-08-11 ENCOUNTER — Telehealth: Payer: Self-pay | Admitting: Cardiology

## 2016-08-11 ENCOUNTER — Telehealth: Payer: Self-pay | Admitting: *Deleted

## 2016-08-11 ENCOUNTER — Other Ambulatory Visit: Payer: Medicaid Other | Admitting: *Deleted

## 2016-08-11 DIAGNOSIS — E876 Hypokalemia: Secondary | ICD-10-CM

## 2016-08-11 LAB — BASIC METABOLIC PANEL
BUN / CREAT RATIO: 32 — AB (ref 12–28)
BUN: 48 mg/dL — ABNORMAL HIGH (ref 8–27)
CHLORIDE: 86 mmol/L — AB (ref 96–106)
CO2: 39 mmol/L — AB (ref 18–29)
Calcium: 10.4 mg/dL — ABNORMAL HIGH (ref 8.7–10.3)
Creatinine, Ser: 1.52 mg/dL — ABNORMAL HIGH (ref 0.57–1.00)
GFR calc Af Amer: 41 mL/min/{1.73_m2} — ABNORMAL LOW (ref >59)
GFR calc non Af Amer: 36 mL/min/{1.73_m2} — ABNORMAL LOW (ref >59)
GLUCOSE: 155 mg/dL — AB (ref 65–99)
Potassium: 2.7 mmol/L — ABNORMAL LOW (ref 3.5–5.2)
SODIUM: 135 mmol/L (ref 134–144)

## 2016-08-11 NOTE — Telephone Encounter (Signed)
New message    Pt daughter states that her mother received a phone call from nurse. States she was not supposed to be called - that daughter April should always be called. She states her mother has alzheimer's and would not remember medication changes and she needs a call back today regarding those changes.

## 2016-08-11 NOTE — Telephone Encounter (Signed)
-----   Message from Laurann Montanaayna N Dunn, [place] sent at 08/11/2016  1:36 PM EST ----- Please call patient. Kidney function and potassium are marginally better. Reviewed with Dr. Jens Somrenshaw. Please give KCl 40meq TID today, then continue BID. Do not take any more metolazone. Otherwise continue current meds.  Recheck BMET to be run STAT on Monday. Dayna Dunn PA-C

## 2016-08-11 NOTE — Telephone Encounter (Signed)
I spoke with patient's sister, April Miller, who states the patient is autistic and also has mild dementia and she will not remember the medication changes and that we need to let April know any changes so they can be for sure followed through.  April states that it will be nearly impossible to get pt to our lab on Monday since they have no transportation Mon Wed or Fri and today they had to ask a neighbor to bring them.  In addition, their mom just passed away and there is a lot going on otherwise.  She is sure patient can be here Tuesday in the morning and realizes that her sister's health is important but is afraid that is all she can do.  We discussed the risk of using the local medical office is that the results would be delayed.  I advised I am sending to Ronie Spiesayna Dunn, PA-C to inform and if this is not acceptable/safe for patient we will call her back, but not sure she has any means to bring her.

## 2016-08-11 NOTE — Telephone Encounter (Signed)
Informed patient's sister April of this informtion.   She verbalizes her understanding and agreement with this plan.

## 2016-08-11 NOTE — Telephone Encounter (Signed)
Reviewed with patient. She understands to take a 40 meq TID today and 40 meq BID thereafter.  She is no longer taking metolazone. Her [facility]. Orders placed, appointment made.

## 2016-08-11 NOTE — Telephone Encounter (Signed)
I spoke with Dr. Jens Somrenshaw. Tuesday 2/27 would be OK but NO LATER that that. If she develops any leg cramps, increased heart rate, lightheadedness or passing out, needs to go to ER. Otherwise please get stat BMET on Tuesday. Nazier Neyhart PA-C

## 2016-08-14 ENCOUNTER — Other Ambulatory Visit: Payer: Self-pay

## 2016-08-15 ENCOUNTER — Other Ambulatory Visit: Payer: Medicaid Other | Admitting: *Deleted

## 2016-08-15 ENCOUNTER — Telehealth: Payer: Self-pay | Admitting: *Deleted

## 2016-08-15 DIAGNOSIS — E876 Hypokalemia: Secondary | ICD-10-CM

## 2016-08-15 LAB — BASIC METABOLIC PANEL WITH GFR
BUN/Creatinine Ratio: 37 — ABNORMAL HIGH (ref 12–28)
BUN: 46 mg/dL — ABNORMAL HIGH (ref 8–27)
CO2: 38 mmol/L — ABNORMAL HIGH (ref 18–29)
Calcium: 10.4 mg/dL — ABNORMAL HIGH (ref 8.7–10.3)
Chloride: 93 mmol/L — ABNORMAL LOW (ref 96–106)
Creatinine, Ser: 1.26 mg/dL — ABNORMAL HIGH (ref 0.57–1.00)
GFR calc Af Amer: 52 mL/min/1.73 — ABNORMAL LOW
GFR calc non Af Amer: 45 mL/min/1.73 — ABNORMAL LOW
Glucose: 147 mg/dL — ABNORMAL HIGH (ref 65–99)
Potassium: 3.2 mmol/L — ABNORMAL LOW (ref 3.5–5.2)
Sodium: 141 mmol/L (ref 134–144)

## 2016-08-15 MED ORDER — POTASSIUM CHLORIDE CRYS ER 20 MEQ PO TBCR: 40.0000 meq | EXTENDED_RELEASE_TABLET | Freq: Three times a day (TID) | ORAL | 1 refills | Status: DC

## 2016-08-15 NOTE — Telephone Encounter (Signed)
Pt sister, April, [place], has been made aware of pts lab results. She will increase K= to 20 meq 2 tablets tid and d/c Metolazone. O/V set up for 08/17/16 @ 2:00 with Carlean JewsKatie Thompson, PA-C.

## 2016-08-15 NOTE — Telephone Encounter (Signed)
-----   Message from Laurann Montanaayna N Dunn, [place] sent at 08/15/2016  1:27 PM EST ----- Please let patient/patient's sister know that Creatinine continues to improve slowly along with potassium. Would recommend to increase KCl to 40meq TID for now, DO NOT TAKE ANY MORE METOLAZONE. Needs f/u office visit later this week at one of our locations to reassess potassium/volume status and to make arrangements for cardioversion if appropriate. Dayna Dunn PA-C

## 2016-08-17 ENCOUNTER — Encounter: Payer: Self-pay | Admitting: Physician Assistant

## 2016-08-17 ENCOUNTER — Ambulatory Visit (INDEPENDENT_AMBULATORY_CARE_PROVIDER_SITE_OTHER): Payer: Medicaid Other | Admitting: Physician Assistant

## 2016-08-17 VITALS — BP 100/68 | HR 69 | Ht 67.0 in | Wt 239.1 lb

## 2016-08-17 DIAGNOSIS — I5033 Acute on chronic diastolic (congestive) heart failure: Secondary | ICD-10-CM

## 2016-08-17 DIAGNOSIS — I4891 Unspecified atrial fibrillation: Secondary | ICD-10-CM

## 2016-08-17 DIAGNOSIS — Z953 Presence of xenogenic heart valve: Secondary | ICD-10-CM | POA: Diagnosis not present

## 2016-08-17 DIAGNOSIS — E876 Hypokalemia: Secondary | ICD-10-CM | POA: Diagnosis not present

## 2016-08-17 DIAGNOSIS — I10 Essential (primary) hypertension: Secondary | ICD-10-CM | POA: Diagnosis not present

## 2016-08-17 NOTE — Progress Notes (Signed)
Cardiology Office Note    Date:  08/17/2016   ID:  Jennifer Bauer, Jennifer Bauer [birth year], MRN [id number]  PCP:  Lonie Peak, PA-C  Cardiologist: Dr. Elease Hashimoto  CC: follow up   History of Present Illness:  Jennifer Bauer is a 66 y.o. female with a history of HTN, s/p bioprosthetic aortic valve by Dr. Cornelius Moras 03/2011, post-op afib and recently diagnosed persistant afib and hypokalemia who presents to clinic for follow up.   She transferred from [facility] to [facility] 03/2011 with chest pain. Echo prior to tx demonstrated mod to severe AS. LHC 03/27/11: normal LVF with EF 55%, normal cors and mod AI, severe AS with mean gradient 42 mmHg. Carotids were neg for ICA stenosis. She was referred for AVR with Dr. 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 Post op course was complicated by AFib that converted to NSR with amiodarone.   He was doing well on cardiac stand point when seen last  by Dr. Elease Hashimoto 08/2011. Echocardiogram 03/2016 showed left ventricular function of 55-60%, mild LVH and normal functioning aortic valve.  She was noticed to be in A. fib with RVR by PCP during her follow-up 06/01/16. She was asymptomatic. Case was discussed with Dr. Elease Hashimoto via Telephone. Started on Xarelto 20 mg a day. Her metoprolol XL was increased to 150 mga day for better rate control.  Was admitted [facility] 06/18/16-06/28/16 for lower extremity celluslitis with severe edema. Treated with antibiotics. Hospital course complicated by A. fib RVR. Discussed with Dr. Elease Hashimoto again and her beta blocker and calcium channel blocker increased. Planned for rate control and possible outpatient cardioversion. Echo during admission 06/26/16 showed EF 55-60%, mild- mod R/LAE, aortic sclerosis, mild to mod TR, G2DD, mild LVH and mild pulm HTN.   She saw Vin Bhagat PA-C in the office on 07/27/16 for follow up. She was found to be volume overloaded (weight 252 up from 236lbs) and in persistant afib with HR 70. She was  scheduled for DCCV after 3 weeks of uninterrupted OAC and metolazone 2.5mg  MWF was added to her regimen.   When she showed up for DCCV on 08/10/16 her potassium was noted to be very low at 2.2 and creat up to 1.6 (up from 0.9 in 06/2016) and this was cancelled. She was instructed to increase her potassium to 40mg  TID and stop metolazone.   Today she presents to clinic for follow up. No CP but sometimes get SOB. LE edema is much better. No orthopnea or PND. She has chronic dizziness but no syncope. No blood in stool or urine. No palpitations.    Past Medical History:  Diagnosis Date  . Aortic insufficiency and aortic stenosis    s/p Tissue AVR with Dr. Cornelius Moras 03/2011; preop LHC with normal cors;  pre op carotid dopplers neg for ICA stenosis  . Arthritis   . Atrial fibrillation (HCC)    post op after AVR 10/12  . HTN (hypertension)   . Mitral regurgitation   . SVT (supraventricular tachycardia) (HCC)     Past Surgical History:  Procedure Laterality Date  . AORTIC VALVE REPLACEMENT  03/30/2011   #90mm [facility] Ease pericardial tissue valve  . hysterctomy    . TONSILLECTOMY      Current Medications: Outpatient Medications Prior to Visit  Medication Sig Dispense Refill  . citalopram (CELEXA) 20 MG tablet Take 20 mg by mouth daily.    . clonazePAM (KLONOPIN) 0.5 MG tablet Take 0.5 mg by mouth 2 (two)  times daily.    Marland Kitchen. diltiazem (CARDIZEM CD) 360 MG 24 hr capsule Take 360 mg by mouth daily.    . furosemide (LASIX) 80 MG tablet Take 80 mg by mouth 2 (two) times daily.    . potassium chloride SA (K-DUR,KLOR-CON) 20 MEQ tablet Take 2 tablets (40 mEq total) by mouth 3 (three) times daily. 180 tablet 1  . rivaroxaban (XARELTO) 20 MG TABS tablet Take 20 mg by mouth daily.    . traMADol (ULTRAM) 50 MG tablet Take 50 mg by mouth 2 (two) times daily.    . metoprolol (TOPROL-XL) 200 MG 24 hr tablet Take 200 mg by mouth daily.    . metoprolol tartrate (LOPRESSOR) 25 MG tablet Take 25 mg by mouth  2 (two) times daily.       No facility-administered medications prior to visit.      Allergies:   Acetaminophen   Social History   Social History  . Marital status: Single    Spouse name: N/A  . Number of children: 0  . Years of education: N/A   Social History Main Topics  . Smoking status: Never Smoker  . Smokeless tobacco: Never Used  . Alcohol use No  . Drug use: No  . Sexual activity: Not Asked   Other Topics Concern  . None   Social History Narrative  . None     Family History:  The patient's family history includes Colon cancer in her sister; Diabetes in her father and sister; Heart attack in her father; Stroke in her father.      VS:  BP 100/68   Pulse 69   Ht 5\' 7"  (1.702 m)   Wt 239 lb 1.9 oz (108.5 kg)   SpO2 98%   BMI 37.45 kg/m    GEN: Well nourished, well developed, in no acute distress, chorincally ill appearing. obese HEENT: normal  Neck: no JVD, carotid bruits, or masses Cardiac: irreg irreg; no murmurs, rubs, or gallops, 1+ left LE edema  Respiratory:  clear to auscultation bilaterally, normal work of breathing GI: soft, nontender, nondistended, + BS MS: no deformity or atrophy  Skin: warm and dry, no rash Neuro:  Alert and Oriented x 3, Strength and sensation are intact Psych: euthymic mood, full affect   Wt Readings from Last 3 Encounters:  08/17/16 239 lb 1.9 oz (108.5 kg)  08/10/16 252 lb (114.3 kg)  07/27/16 252 lb (114.3 kg)      Studies/Labs Reviewed:   EKG:  EKG is ordered today.  The ekg ordered today demonstrates atrial fibrillation HR 67. With PVC.  Recent Labs: 08/10/2016: Hemoglobin 13.6 08/15/2016: BUN 46; Creatinine, Ser 1.26; Potassium 3.2; Sodium 141   Lipid Panel    Component Value Date/Time   CHOL 172 09/06/2011 1213   TRIG 169.0 (H) 09/06/2011 1213   HDL 51.70 09/06/2011 1213   CHOLHDL 3 09/06/2011 1213   VLDL 33.8 09/06/2011 1213   LDLCALC 87 09/06/2011 1213    Additional studies/ records that were  reviewed today include:  [facility] Mitral annular calcification  Dilated left atrium - mild to moderate  Aortic sclerosis  Normal right ventricular systolic function  Tricuspid regurgitation - mild to moderate  Diastolic dysfunction - grade II (elevated filling pressures)  Dilated right atrium - mild  Mildly elevated right atrial pressure  Elevated pulmonary artery systolic pressure - mild to moderate  Left ventricular hypertrophy - mild  Normal left ventricular systolic function, ejection fraction 55 to 60  ASSESSMENT & PLAN:   Persistent atrial fibrillation: ECG today shows persistent atrial fibrillation. Rate has been well controlled on Torpol XL 200mg  BID. HR 69. Continue Xarelto 20mg  daily for CHADSVASC score of at least 3 (CHF, HTN, F sex). If her labs look better today I will set her up for DCCV.  Chronic diastolic CHF: weight has gotten down to 239 down from 252 lbs. Currently on lasix 80mg  BID. She never adds salt but has been eating some fast food recently. Continue same regimen. Went over salt and fluid restrictions. She will call us if she gains more than 2 lbs in 1 day or 5 lbs in a week.    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 Sometimes she gets dizzy but this has been chronic   Hypokalemia/AKI: K down to 2.2 and creat up to 1.6. Likely from over diuresis. Metolazone discontinued and potassium supplemented.  Will recheck bmet today    Medication Adjustments/Labs and Tests Ordered: Current medicines are reviewed at length with the patient today.  Concerns regarding medicines are outlined above.  Medication changes, Labs and Tests ordered today are listed in the Patient Instructions below. Patient Instructions  Medication Instructions:  Your physician recommends that you continue on your current medications as directed. Please refer to the Current Medication list given to you  today.   Labwork: TODAY:  BMET, CBC, & PT/INR  Testing/Procedures: Your physician has recommended that you have a Cardioversion (DCCV). Electrical Cardioversion uses a jolt of electricity to your heart either through paddles or wired patches attached to your chest. This is a controlled, usually prescheduled, procedure. Defibrillation is done under light anesthesia in the hospital, and you usually go home the day of the procedure. This is done to get your heart back into a normal rhythm. You are not awake for the procedure. Please see the instruction sheet given to you today.  Follow-Up: WE WILL LET YOU KNOW.  Any Other Special Instructions Will Be Listed Below (If Applicable).     If you need a refill on your cardiac medications before your next appointment, please call your pharmacy.      Signed, Cline Crock, PA-C  08/17/2016 2:42 PM    [facility] [address] [place], [place], [place]  [address] Phone: [phone]; Fax: [phone]

## 2016-08-17 NOTE — Patient Instructions (Signed)
Medication Instructions:  Your physician recommends that you continue on your current medications as directed. Please refer to the Current Medication list given to you today.   Labwork: TODAY:  BMET, CBC, & PT/INR  Testing/Procedures: Your physician has recommended that you have a Cardioversion (DCCV). Electrical Cardioversion uses a jolt of electricity to your heart either through paddles or wired patches attached to your chest. This is a controlled, usually prescheduled, procedure. Defibrillation is done under light anesthesia in the hospital, and you usually go home the day of the procedure. This is done to get your heart back into a normal rhythm. You are not awake for the procedure. Please see the instruction sheet given to you today.  Follow-Up: WE WILL LET YOU KNOW.  Any Other Special Instructions Will Be Listed Below (If Applicable).     If you need a refill on your cardiac medications before your next appointment, please call your pharmacy.

## 2016-08-18 ENCOUNTER — Telehealth: Payer: Self-pay | Admitting: *Deleted

## 2016-08-18 LAB — CBC
HEMOGLOBIN: 11.4 g/dL (ref 11.1–15.9)
Hematocrit: 35.6 % (ref 34.0–46.6)
MCH: 25.3 pg — AB (ref 26.6–33.0)
MCHC: 32 g/dL (ref 31.5–35.7)
MCV: 79 fL (ref 79–97)
PLATELETS: 256 10*3/uL (ref 150–379)
RBC: 4.51 x10E6/uL (ref 3.77–5.28)
RDW: 16.5 % — ABNORMAL HIGH (ref 12.3–15.4)
WBC: 5.2 10*3/uL (ref 3.4–10.8)

## 2016-08-18 LAB — BASIC METABOLIC PANEL
BUN/Creatinine Ratio: 30 — ABNORMAL HIGH (ref 12–28)
BUN: 31 mg/dL — ABNORMAL HIGH (ref 8–27)
CALCIUM: 9.7 mg/dL (ref 8.7–10.3)
CHLORIDE: 97 mmol/L (ref 96–106)
CO2: 28 mmol/L (ref 18–29)
Creatinine, Ser: 1.05 mg/dL — ABNORMAL HIGH (ref 0.57–1.00)
GFR calc Af Amer: 65 mL/min/{1.73_m2} (ref >59)
GFR calc non Af Amer: 56 mL/min/{1.73_m2} — ABNORMAL LOW (ref >59)
GLUCOSE: 107 mg/dL — AB (ref 65–99)
POTASSIUM: 4.4 mmol/L (ref 3.5–5.2)
Sodium: 143 mmol/L (ref 134–144)

## 2016-08-18 LAB — PROTIME-INR
INR: 1.1 (ref 0.8–1.2)
PROTHROMBIN TIME: 11.8 s (ref 9.1–12.0)

## 2016-08-18 MED ORDER — POTASSIUM CHLORIDE CRYS ER 20 MEQ PO TBCR: 40.0000 meq | EXTENDED_RELEASE_TABLET | Freq: Two times a day (BID) | ORAL | 1 refills | Status: DC

## 2016-08-18 NOTE — Telephone Encounter (Signed)
-----   Message from Janetta HoraKathryn R Thompson, PA-C sent at 08/18/2016  9:01 AM EST ----- Her potassium is normal. Lets have her cut back to 40 Meq BID on Kdur. Also her kidney function looks a lot better. We can get her set up for DCCV sometime in the next week.

## 2016-08-19 NOTE — Addendum Note (Signed)
Addended by: Janetta HoraHOMPSON, Sejal Cofield R on: 08/19/2016 07:43 AM   Modules accepted: Orders, SmartSet

## 2016-08-24 ENCOUNTER — Ambulatory Visit (HOSPITAL_COMMUNITY): Payer: Medicaid Other | Admitting: Anesthesiology

## 2016-08-24 ENCOUNTER — Encounter (HOSPITAL_COMMUNITY)
Admission: RE | Disposition: A | Discharge status: Home / Self Care | Payer: Self-pay | Source: Ambulatory Visit | Attending: Internal Medicine

## 2016-08-24 ENCOUNTER — Ambulatory Visit (HOSPITAL_COMMUNITY)
Admission: RE | Admit: 2016-08-24 | Discharge: 2016-08-24 | Disposition: A | Discharge status: Home / Self Care | Payer: Medicaid Other | Source: Ambulatory Visit | Attending: Internal Medicine | Admitting: Internal Medicine

## 2016-08-24 DIAGNOSIS — Z953 Presence of xenogenic heart valve: Secondary | ICD-10-CM | POA: Diagnosis not present

## 2016-08-24 DIAGNOSIS — I340 Nonrheumatic mitral (valve) insufficiency: Secondary | ICD-10-CM | POA: Diagnosis not present

## 2016-08-24 DIAGNOSIS — Z7901 Long term (current) use of anticoagulants: Secondary | ICD-10-CM | POA: Diagnosis not present

## 2016-08-24 DIAGNOSIS — I272 Pulmonary hypertension, unspecified: Secondary | ICD-10-CM | POA: Diagnosis not present

## 2016-08-24 DIAGNOSIS — Z9889 Other specified postprocedural states: Secondary | ICD-10-CM | POA: Insufficient documentation

## 2016-08-24 DIAGNOSIS — Z79899 Other long term (current) drug therapy: Secondary | ICD-10-CM | POA: Insufficient documentation

## 2016-08-24 DIAGNOSIS — I5032 Chronic diastolic (congestive) heart failure: Secondary | ICD-10-CM | POA: Insufficient documentation

## 2016-08-24 DIAGNOSIS — Z886 Allergy status to analgesic agent status: Secondary | ICD-10-CM | POA: Diagnosis not present

## 2016-08-24 DIAGNOSIS — I110 Hypertensive heart disease with heart failure: Secondary | ICD-10-CM | POA: Insufficient documentation

## 2016-08-24 DIAGNOSIS — E876 Hypokalemia: Secondary | ICD-10-CM | POA: Diagnosis not present

## 2016-08-24 DIAGNOSIS — I471 Supraventricular tachycardia: Secondary | ICD-10-CM | POA: Insufficient documentation

## 2016-08-24 DIAGNOSIS — N179 Acute kidney failure, unspecified: Secondary | ICD-10-CM | POA: Insufficient documentation

## 2016-08-24 DIAGNOSIS — I4891 Unspecified atrial fibrillation: Secondary | ICD-10-CM

## 2016-08-24 DIAGNOSIS — I481 Persistent atrial fibrillation: Secondary | ICD-10-CM | POA: Diagnosis not present

## 2016-08-24 HISTORY — PX: CARDIOVERSION: SHX1299

## 2016-08-24 SURGERY — CARDIOVERSION
Anesthesia: General

## 2016-08-24 MED ORDER — PROPOFOL 10 MG/ML IV BOLUS
INTRAVENOUS | Status: DC | PRN
  Administered 2016-08-24: 70 ug/kg/min via INTRAVENOUS

## 2016-08-24 MED ORDER — SODIUM CHLORIDE 0.9 % IV SOLN
INTRAVENOUS | Status: DC | PRN
  Administered 2016-08-24: 10:00:00 via INTRAVENOUS

## 2016-08-24 MED ORDER — LIDOCAINE HCL (CARDIAC) 20 MG/ML IV SOLN
INTRAVENOUS | Status: DC | PRN
  Administered 2016-08-24: 60 mg via INTRAVENOUS

## 2016-08-24 NOTE — Anesthesia Postprocedure Evaluation (Signed)
Anesthesia Post Note  Patient: Jennifer Bauer  Procedure(s) Performed: Procedure(s) (LRB): CARDIOVERSION (N/A)  Patient location during evaluation: PACU Anesthesia Type: General Level of consciousness: awake and alert Pain management: pain level controlled Vital Signs Assessment: post-procedure vital signs reviewed and stable Respiratory status: spontaneous breathing, nonlabored ventilation, respiratory function stable and patient connected to nasal cannula oxygen Cardiovascular status: blood pressure returned to baseline and stable Postop Assessment: no signs of nausea or vomiting Anesthetic complications: no       Last Vitals:  Vitals:   08/24/16 1105 08/24/16 1112  BP: (!) 121/95 126/80  Pulse: 72 76  Resp: 18 18    Last Pain:  Vitals:   08/24/16 1008  TempSrc: Oral                 [name]

## 2016-08-24 NOTE — Anesthesia Procedure Notes (Signed)
Procedure Name: MAC Date/Time: 08/24/2016 10:30 AM Performed by: Lelon Perla A Pre-anesthesia Checklist: Patient identified, Emergency Drugs available, Patient being monitored and Suction available Patient Re-evaluated:Patient Re-evaluated prior to inductionOxygen Delivery Method: Ambu bag Preoxygenation: Pre-oxygenation with 100% oxygen Intubation Type: IV induction Placement Confirmation: positive ETCO2

## 2016-08-24 NOTE — Transfer of Care (Signed)
Immediate Anesthesia Transfer of Care Note  Patient: Jennifer Bauer  Procedure(s) Performed: Procedure(s): CARDIOVERSION (N/A)  Patient Location: PACU  Anesthesia Type:General  Level of Consciousness: awake and alert   Airway & Oxygen Therapy: Patient Spontanous Breathing and Patient connected to nasal cannula oxygen  Post-op Assessment: Patient moving all extremities  Post vital signs: Reviewed and stable  Last Vitals:  Vitals:   08/24/16 1008  BP: (!) 139/102  Pulse: (!) 109  Resp: (!) 27    Last Pain:  Vitals:   08/24/16 1008  TempSrc: Oral         Complications: No apparent anesthesia complications

## 2016-08-24 NOTE — Op Note (Signed)
Patient anesthetized by anesthesia with Propofol IV With pads in AP position patient cardioverted to SR with 200 J synchronized biphasic energy   Procedure without complications

## 2016-08-24 NOTE — Interval H&P Note (Signed)
History and Physical Interval Note:  08/24/2016 10:14 AM  Jennifer Bauer  has presented today for surgery, with the diagnosis of a fib  The various methods of treatment have been discussed with the patient and family. After consideration of risks, benefits and other options for treatment, the patient has consented to  Procedure(s): CARDIOVERSION (N/A) as a surgical intervention .  The patient's history has been reviewed, patient examined, no change in status, stable for surgery.  I have reviewed the patient's chart and labs.  Questions were answered to the patient's satisfaction.     Dietrich PatesPaula Ross

## 2016-08-24 NOTE — Anesthesia Preprocedure Evaluation (Signed)
Anesthesia Evaluation  Patient identified by MRN, date of birth, ID band Patient awake    Reviewed: Allergy & Precautions, NPO status , Patient's Chart, lab work & pertinent test results  Airway Mallampati: II  TM Distance: >3 FB Neck ROM: Full    Dental no notable dental hx.    Pulmonary neg pulmonary ROS,    Pulmonary exam normal breath sounds clear to auscultation       Cardiovascular hypertension, + dysrhythmias Atrial Fibrillation  Rhythm:Regular Rate:Normal + Systolic murmurs [facility]16 for lower extremity celluslitis with severe edema. Treated with antibiotics. Hospital course complicated by A. fib RVR. Discussed with Dr. Willadean CarolNahseragain and her beta blocker and calcium channel blocker increased. Planned for rate control and possible outpatient cardioversion. Echo during admission 06/26/16 showed EF 55-60%, mild- mod R/LAE, aortic sclerosis, mild to mod TR, G2DD, mild LVH and mild pulm HTN.   S/P AVR 2012    Neuro/Psych negative neurological ROS  negative psych ROS   GI/Hepatic negative GI ROS, Neg liver ROS,   Endo/Other  negative endocrine ROS  Renal/GU negative Renal ROS  negative genitourinary   Musculoskeletal negative musculoskeletal ROS (+)   Abdominal   Peds negative pediatric ROS (+)  Hematology negative hematology ROS (+)   Anesthesia Other Findings   Reproductive/Obstetrics negative OB ROS                             Anesthesia Physical Anesthesia Plan  ASA: III  Anesthesia Plan: General   Post-op Pain Management:    Induction: Intravenous  Airway Management Planned: Mask  Additional Equipment:   Intra-op Plan:   Post-operative Plan:   Informed Consent: I have reviewed the patients History and Physical, chart, labs and discussed the procedure including the risks, benefits and alternatives for the proposed anesthesia with the  patient or authorized representative who has indicated his/her understanding and acceptance.   Dental advisory given  Plan Discussed with: CRNA and Surgeon  Anesthesia Plan Comments:         Anesthesia Quick Evaluation

## 2016-08-24 NOTE — H&P (View-Only) (Signed)
Cardiology Office Note    Date:  08/17/2016   ID:  Pegah, Segel [birth year], MRN [id number]  PCP:  Lonie Peak, PA-C  Cardiologist: Dr. Elease Hashimoto  CC: follow up   History of Present Illness:  ALFREDO COLLYMORE is a 66 y.o. female with a history of HTN, s/p bioprosthetic aortic valve by Dr. Cornelius Moras 03/2011, post-op afib and recently diagnosed persistant afib and hypokalemia who presents to clinic for follow up.   She transferred from [facility] to [facility] 03/2011 with chest pain. Echo prior to tx demonstrated mod to severe AS. LHC 03/27/11: normal LVF with EF 55%, normal cors and mod AI, severe AS with mean gradient 42 mmHg. Carotids were neg for ICA stenosis. She was referred for AVR with Dr. 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 Post op course was complicated by AFib that converted to NSR with amiodarone.   He was doing well on cardiac stand point when seen last  by Dr. Elease Hashimoto 08/2011. Echocardiogram 03/2016 showed left ventricular function of 55-60%, mild LVH and normal functioning aortic valve.  She was noticed to be in A. fib with RVR by PCP during her follow-up 06/01/16. She was asymptomatic. Case was discussed with Dr. Elease Hashimoto via Telephone. Started on Xarelto 20 mg a day. Her metoprolol XL was increased to 150 mga day for better rate control.  Was admitted [facility] 06/18/16-06/28/16 for lower extremity celluslitis with severe edema. Treated with antibiotics. Hospital course complicated by A. fib RVR. Discussed with Dr. Elease Hashimoto again and her beta blocker and calcium channel blocker increased. Planned for rate control and possible outpatient cardioversion. Echo during admission 06/26/16 showed EF 55-60%, mild- mod R/LAE, aortic sclerosis, mild to mod TR, G2DD, mild LVH and mild pulm HTN.   She saw Vin Bhagat PA-C in the office on 07/27/16 for follow up. She was found to be volume overloaded (weight 252 up from 236lbs) and in persistant afib with HR 70. She was  scheduled for DCCV after 3 weeks of uninterrupted OAC and metolazone 2.5mg  MWF was added to her regimen.   When she showed up for DCCV on 08/10/16 her potassium was noted to be very low at 2.2 and creat up to 1.6 (up from 0.9 in 06/2016) and this was cancelled. She was instructed to increase her potassium to 40mg  TID and stop metolazone.   Today she presents to clinic for follow up. No CP but sometimes get SOB. LE edema is much better. No orthopnea or PND. She has chronic dizziness but no syncope. No blood in stool or urine. No palpitations.    Past Medical History:  Diagnosis Date  . Aortic insufficiency and aortic stenosis    s/p Tissue AVR with Dr. Cornelius Moras 03/2011; preop LHC with normal cors;  pre op carotid dopplers neg for ICA stenosis  . Arthritis   . Atrial fibrillation (HCC)    post op after AVR 10/12  . HTN (hypertension)   . Mitral regurgitation   . SVT (supraventricular tachycardia) (HCC)     Past Surgical History:  Procedure Laterality Date  . AORTIC VALVE REPLACEMENT  03/30/2011   #90mm [facility] Ease pericardial tissue valve  . hysterctomy    . TONSILLECTOMY      Current Medications: Outpatient Medications Prior to Visit  Medication Sig Dispense Refill  . citalopram (CELEXA) 20 MG tablet Take 20 mg by mouth daily.    . clonazePAM (KLONOPIN) 0.5 MG tablet Take 0.5 mg by mouth 2 (two)  times daily.    Marland Kitchen. diltiazem (CARDIZEM CD) 360 MG 24 hr capsule Take 360 mg by mouth daily.    . furosemide (LASIX) 80 MG tablet Take 80 mg by mouth 2 (two) times daily.    . potassium chloride SA (K-DUR,KLOR-CON) 20 MEQ tablet Take 2 tablets (40 mEq total) by mouth 3 (three) times daily. 180 tablet 1  . rivaroxaban (XARELTO) 20 MG TABS tablet Take 20 mg by mouth daily.    . traMADol (ULTRAM) 50 MG tablet Take 50 mg by mouth 2 (two) times daily.    . metoprolol (TOPROL-XL) 200 MG 24 hr tablet Take 200 mg by mouth daily.    . metoprolol tartrate (LOPRESSOR) 25 MG tablet Take 25 mg by mouth  2 (two) times daily.       No facility-administered medications prior to visit.      Allergies:   Acetaminophen   Social History   Social History  . Marital status: Single    Spouse name: N/A  . Number of children: 0  . Years of education: N/A   Social History Main Topics  . Smoking status: Never Smoker  . Smokeless tobacco: Never Used  . Alcohol use No  . Drug use: No  . Sexual activity: Not Asked   Other Topics Concern  . None   Social History Narrative  . None     Family History:  The patient's family history includes Colon cancer in her sister; Diabetes in her father and sister; Heart attack in her father; Stroke in her father.      VS:  BP 100/68   Pulse 69   Ht 5\' 7"  (1.702 m)   Wt 239 lb 1.9 oz (108.5 kg)   SpO2 98%   BMI 37.45 kg/m    GEN: Well nourished, well developed, in no acute distress, chorincally ill appearing. obese HEENT: normal  Neck: no JVD, carotid bruits, or masses Cardiac: irreg irreg; no murmurs, rubs, or gallops, 1+ left LE edema  Respiratory:  clear to auscultation bilaterally, normal work of breathing GI: soft, nontender, nondistended, + BS MS: no deformity or atrophy  Skin: warm and dry, no rash Neuro:  Alert and Oriented x 3, Strength and sensation are intact Psych: euthymic mood, full affect   Wt Readings from Last 3 Encounters:  08/17/16 239 lb 1.9 oz (108.5 kg)  08/10/16 252 lb (114.3 kg)  07/27/16 252 lb (114.3 kg)      Studies/Labs Reviewed:   EKG:  EKG is ordered today.  The ekg ordered today demonstrates atrial fibrillation HR 67. With PVC.  Recent Labs: 08/10/2016: Hemoglobin 13.6 08/15/2016: BUN 46; Creatinine, Ser 1.26; Potassium 3.2; Sodium 141   Lipid Panel    Component Value Date/Time   CHOL 172 09/06/2011 1213   TRIG 169.0 (H) 09/06/2011 1213   HDL 51.70 09/06/2011 1213   CHOLHDL 3 09/06/2011 1213   VLDL 33.8 09/06/2011 1213   LDLCALC 87 09/06/2011 1213    Additional studies/ records that were  reviewed today include:  [facility] Mitral annular calcification  Dilated left atrium - mild to moderate  Aortic sclerosis  Normal right ventricular systolic function  Tricuspid regurgitation - mild to moderate  Diastolic dysfunction - grade II (elevated filling pressures)  Dilated right atrium - mild  Mildly elevated right atrial pressure  Elevated pulmonary artery systolic pressure - mild to moderate  Left ventricular hypertrophy - mild  Normal left ventricular systolic function, ejection fraction 55 to 60  ASSESSMENT & PLAN:   Persistent atrial fibrillation: ECG today shows persistent atrial fibrillation. Rate has been well controlled on Torpol XL 200mg  BID. HR 69. Continue Xarelto 20mg  daily for CHADSVASC score of at least 3 (CHF, HTN, F sex). If her labs look better today I will set her up for DCCV.  Chronic diastolic CHF: weight has gotten down to 239 down from 252 lbs. Currently on lasix 80mg  BID. She never adds salt but has been eating some fast food recently. Continue same regimen. Went over salt and fluid restrictions. She will call us if she gains more than 2 lbs in 1 day or 5 lbs in a week.    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 Sometimes she gets dizzy but this has been chronic   Hypokalemia/AKI: K down to 2.2 and creat up to 1.6. Likely from over diuresis. Metolazone discontinued and potassium supplemented.  Will recheck bmet today    Medication Adjustments/Labs and Tests Ordered: Current medicines are reviewed at length with the patient today.  Concerns regarding medicines are outlined above.  Medication changes, Labs and Tests ordered today are listed in the Patient Instructions below. Patient Instructions  Medication Instructions:  Your physician recommends that you continue on your current medications as directed. Please refer to the Current Medication list given to you  today.   Labwork: TODAY:  BMET, CBC, & PT/INR  Testing/Procedures: Your physician has recommended that you have a Cardioversion (DCCV). Electrical Cardioversion uses a jolt of electricity to your heart either through paddles or wired patches attached to your chest. This is a controlled, usually prescheduled, procedure. Defibrillation is done under light anesthesia in the hospital, and you usually go home the day of the procedure. This is done to get your heart back into a normal rhythm. You are not awake for the procedure. Please see the instruction sheet given to you today.  Follow-Up: WE WILL LET YOU KNOW.  Any Other Special Instructions Will Be Listed Below (If Applicable).     If you need a refill on your cardiac medications before your next appointment, please call your pharmacy.      Signed, Cline Crock, PA-C  08/17/2016 2:42 PM    [facility] [address] [place], [place], [place]  [address] Phone: [phone]; Fax: [phone]

## 2016-08-24 NOTE — Discharge Instructions (Signed)
Electrical Cardioversion, Care After °This sheet gives you information about how to care for yourself after your procedure. Your health care provider may also give you more specific instructions. If you have problems or questions, contact your health care provider. °What can I expect after the procedure? °After the procedure, it is common to have: °· Some redness on the skin where the shocks were given. °Follow these instructions at home: °· Do not drive for 24 hours if you were given a medicine to help you relax (sedative). °· Take over-the-counter and prescription medicines only as told by your health care provider. °· Ask your health care provider how to check your pulse. Check it often. °· Rest for 48 hours after the procedure or as told by your health care provider. °· Avoid or limit your caffeine use as told by your health care provider. °Contact a health care provider if: °· You feel like your heart is beating too quickly or your pulse is not regular. °· You have a serious muscle cramp that does not go away. °Get help right away if: °· You have discomfort in your chest. °· You are dizzy or you feel faint. °· You have trouble breathing or you are short of breath. °· Your speech is slurred. °· You have trouble moving an arm or leg on one side of your body. °· Your fingers or toes turn cold or blue. °This information is not intended to replace advice given to you by your health care provider. Make sure you discuss any questions you have with your health care provider. °Document Released: 03/26/2013 Document Revised: 01/07/2016 Document Reviewed: 12/10/2015 °Elsevier Interactive Patient Education © 2017 [employer] Inc. ° °

## 2016-08-27 ENCOUNTER — Encounter (HOSPITAL_COMMUNITY): Payer: Self-pay | Admitting: Internal Medicine

## 2016-08-29 ENCOUNTER — Telehealth: Payer: Self-pay

## 2016-08-29 NOTE — Telephone Encounter (Signed)
DOD call from Dr. Dannielle Huhonroy's office. Nurse states patient is bradycardic with no visible p waves on EKG and that Dr. Anna Genreonroy would like to speak to Dr. Eldridge DaceVaranasi (DOD). Patient is currently taking metoprolol (toprol XL) 200 mg BID. Dr. Eldridge DaceVaranasi on the phone with Dr. Anna Genreonroy.

## 2016-08-29 NOTE — Telephone Encounter (Signed)
Per DOD, Dr. Eldridge DaceVaranasi, patient needs follow up appointment in one week with APP and a repeat EKG. Spoke with patient's sister (DPR on file). Appointment was made on 09/07/16 with Thomasene LotKatie Thomspson, PA at 10:00 AM. Sister states that the patient will be taking metoprolol (toprol XL) 100 mg BID per Dr. Anna Genreonroy recommendation after his conversation with Dr. Eldridge DaceVaranasi.

## 2016-08-30 NOTE — Telephone Encounter (Signed)
Agree with plan 

## 2016-08-30 NOTE — Telephone Encounter (Signed)
Patient had a junctional rhythm with rate in the 40s at PMD.  We decreased metoprolol and set up f/u for next week.

## 2016-09-07 ENCOUNTER — Ambulatory Visit: Payer: Medicaid Other | Admitting: Physician Assistant

## 2016-09-12 ENCOUNTER — Encounter: Payer: Self-pay | Admitting: Physician Assistant

## 2016-09-15 ENCOUNTER — Encounter: Payer: Self-pay | Admitting: Physician Assistant

## 2016-09-27 NOTE — Progress Notes (Signed)
Cardiology Office Note   Date:  09/29/2016   ID:  Jennifer Bauer, Jennifer Bauer [birth year], MRN [id number]  PCP:  Lonie Peak, PA-C  Cardiologist:   Dietrich Pates, MD   F/U of HTN and AV dz and atrial fib      History of Present Illness: Jennifer Bauer is a 66 y.o. female with a history of HTN, AV dz (s/p AVR), atrial fib and hypokalemia She wa last in clinic in March 2018  I saw her on March 8 when she underwent cardioversion    Since seen she had her meds cut back due to bradycardia.  She still notes some dizziness  No syncope  Knows when to sit down   Denes CP  Breathing is OK      Current Meds  Medication Sig  . acetaminophen (TYLENOL) 325 MG tablet Take 650 mg by mouth as directed.  . citalopram (CELEXA) 20 MG tablet Take 20 mg by mouth daily.  . clonazePAM (KLONOPIN) 0.5 MG tablet Take 0.5 mg by mouth 2 (two) times daily.  Marland Kitchen diltiazem (CARDIZEM CD) 360 MG 24 hr capsule Take 360 mg by mouth daily.  . furosemide (LASIX) 80 MG tablet Take 80 mg by mouth 2 (two) times daily.  . metoprolol succinate (TOPROL-XL) 100 MG 24 hr tablet Take 200 mg by mouth 2 (two) times daily. Take with or immediately following a meal.  . potassium chloride SA (K-DUR,KLOR-CON) 20 MEQ tablet Take 2 tablets (40 mEq total) by mouth 2 (two) times daily.  . rivaroxaban (XARELTO) 20 MG TABS tablet Take 20 mg by mouth daily.  . traMADol (ULTRAM) 50 MG tablet Take 50 mg by mouth 2 (two) times daily.     Allergies:   Acetaminophen   Past Medical History:  Diagnosis Date  . Aortic insufficiency and aortic stenosis    s/p Tissue AVR with Dr. Cornelius Moras 03/2011; preop LHC with normal cors;  pre op carotid dopplers neg for ICA stenosis  . Arthritis   . Atrial fibrillation (HCC)    post op after AVR 10/12  . HTN (hypertension)   . Mitral regurgitation   . SVT (supraventricular tachycardia) (HCC)     Past Surgical History:  Procedure Laterality Date  . AORTIC VALVE REPLACEMENT  03/30/2011   #19mm [facility] Ease pericardial tissue valve  . CARDIOVERSION N/A 08/24/2016   Procedure: CARDIOVERSION;  Surgeon: Pricilla Riffle, MD;  [facility] END[facility]COPY;  Service: Cardiovascular;  Laterality: N/A;  . hysterctomy    . TONSILLECTOMY       Social History:  The patient  reports that she has never smoked. She has never used smokeless tobacco. She reports that she does not drink alcohol or use drugs.   Family History:  The patient's family history includes Colon cancer in her sister; Diabetes in her father and sister; Heart attack in her father; Stroke in her father.    ROS:  Please see the history of present illness. All other systems are reviewed and  Negative to the above problem except as noted.    PHYSICAL EXAM: VS:  BP 122/72   Pulse (!) 58   Ht  (1.702 m)   Wt 245 lb 12.8 oz (111.5 kg)   BMI 38.50 kg/m   GEN: Morbidly obese 66 yo iin no acute distress   Exmined in wheelchair   HEENT: normal  Neck: Neck full Cardiac: RRR; II/VI systolic murmur LSB to base   rubs, or gallops, 1+ edema  Respiratory:  clear to auscultation bilaterally, normal work of breathing GI: soft, nontender, nondistended, + BS  No hepatomegaly  MS: no deformity Moving all extremities   Skin: warm and dry, no rash  Legs red   Neuro:  Strength and sensation are intact Psych: euthymic mood, full affect   EKG:  EKG is ordered today.  SB 58 bpm  Occasional PVC    Lipid Panel    Component Value Date/Time   CHOL 172 09/06/2011 1213   TRIG 169.0 (H) 09/06/2011 1213   HDL 51.70 09/06/2011 1213   CHOLHDL 3 09/06/2011 1213   VLDL 33.8 09/06/2011 1213   LDLCALC 87 09/06/2011 1213      Wt Readings from Last 3 Encounters:  09/29/16 245 lb 12.8 oz (111.5 kg)  08/24/16 239 lb (108.4 kg)  08/17/16 239 lb 1.9 oz (108.5 kg)      ASSESSMENT AND PLAN:  1  Atrial fib  s/p cardioverson  Maintaining SR  HR is slow  Will cut back on Ditl to 180 mg  Continue metoprolol  Sched f/u with P Nahser in 1 month    2   Edema  Cutting back on dilt may help with edema  Watch salt    Pt will need f/u in 4 to 6 wks      Current medicines are reviewed at length with the patient today.  The patient does not have concerns regarding medicines.  Signed, Dietrich Pates, MD  09/29/2016 10:28 AM    [facility] [address] [place], [place], [place]  [address] Phone: [phone]; Fax: [phone]

## 2016-09-29 ENCOUNTER — Encounter: Payer: Self-pay | Admitting: Internal Medicine

## 2016-09-29 ENCOUNTER — Encounter (INDEPENDENT_AMBULATORY_CARE_PROVIDER_SITE_OTHER): Payer: Self-pay

## 2016-09-29 ENCOUNTER — Ambulatory Visit (INDEPENDENT_AMBULATORY_CARE_PROVIDER_SITE_OTHER): Payer: Medicaid Other | Admitting: Internal Medicine

## 2016-09-29 VITALS — BP 122/72 | HR 58 | Ht 67.0 in | Wt 245.8 lb

## 2016-09-29 DIAGNOSIS — R600 Localized edema: Secondary | ICD-10-CM | POA: Diagnosis not present

## 2016-09-29 DIAGNOSIS — I482 Chronic atrial fibrillation, unspecified: Secondary | ICD-10-CM

## 2016-09-29 MED ORDER — DILTIAZEM HCL ER COATED BEADS 180 MG PO CP24: 180.0000 mg | ORAL_CAPSULE | Freq: Every day | ORAL | 3 refills | Status: DC

## 2016-09-29 NOTE — Patient Instructions (Signed)
Your physician has recommended you make the following change in your medication:  1.) stop diltiazem 360 mg 2.) start diltiazem 180 mg once a day  Your physician recommends that you schedule a follow-up appointment in: 4-6 weeks with Dr. Elease Hashimoto.

## 2016-10-27 ENCOUNTER — Encounter: Payer: Self-pay | Admitting: Cardiovascular Disease

## 2016-10-27 ENCOUNTER — Ambulatory Visit (INDEPENDENT_AMBULATORY_CARE_PROVIDER_SITE_OTHER): Payer: Medicaid Other | Admitting: Cardiovascular Disease

## 2016-10-27 VITALS — BP 136/78 | HR 59 | Ht 67.0 in | Wt 238.0 lb

## 2016-10-27 DIAGNOSIS — I480 Paroxysmal atrial fibrillation: Secondary | ICD-10-CM

## 2016-10-27 DIAGNOSIS — I5032 Chronic diastolic (congestive) heart failure: Secondary | ICD-10-CM | POA: Insufficient documentation

## 2016-10-27 DIAGNOSIS — I10 Essential (primary) hypertension: Secondary | ICD-10-CM | POA: Diagnosis not present

## 2016-10-27 NOTE — Progress Notes (Signed)
Cardiology Office Note   Date:  10/27/2016   ID:  Jennifer Bauer, Jennifer Bauer 11/18/1950, MRN [id number]  PCP:  Lonie Peak, PA-C  Cardiologist:   Kristeen Miss, MD   F/U of HTN and AV dz and atrial fib       previous notes from Dr. Levy Pupa Jennifer Bauer is a 66 y.o. female with a history of HTN, AV dz (s/p AVR), atrial fib and hypokalemia She wa last in clinic in March 2018  I saw her on March 8 when she underwent cardioversion    Since seen she had her meds cut back due to bradycardia.  She still notes some dizziness  No syncope  Knows when to sit down   Denes CP  Breathing is OK   Oct 27, 2016: Jennifer Bauer presents today for follow up  I saw her about 5 years ago . Hx of AS - s/p AVR now   Was admitted with atrial fib Feb. 2018; Had DC cardioversion in August 24, 2016.   No significant palpitations at this point .  Breathing is ok No CP    Current Meds  Medication Sig  . acetaminophen (TYLENOL) 325 MG tablet Take 650 mg by mouth as directed.  . citalopram (CELEXA) 20 MG tablet Take 20 mg by mouth daily.  . clonazePAM (KLONOPIN) 0.5 MG tablet Take 0.5 mg by mouth 2 (two) times daily.  Marland Kitchen diltiazem (CARDIZEM CD) 180 MG 24 hr capsule Take 1 capsule (180 mg total) by mouth daily.  . furosemide (LASIX) 80 MG tablet Take 80 mg by mouth 2 (two) times daily.  . metoprolol succinate (TOPROL-XL) 100 MG 24 hr tablet Take 100 mg by mouth 2 (two) times daily. Take with or immediately following a meal.   . potassium chloride SA (K-DUR,KLOR-CON) 20 MEQ tablet Take 2 tablets (40 mEq total) by mouth 2 (two) times daily.  . rivaroxaban (XARELTO) 20 MG TABS tablet Take 20 mg by mouth daily.  . traMADol (ULTRAM) 50 MG tablet Take 50 mg by mouth 2 (two) times daily.     Allergies:   Acetaminophen   Past Medical History:  Diagnosis Date  . Aortic insufficiency and aortic stenosis    s/p Tissue AVR with Dr. Cornelius Moras 03/2011; preop LHC with normal cors;  pre op carotid dopplers neg for ICA  stenosis  . Arthritis   . Atrial fibrillation (HCC)    post op after AVR 10/12  . HTN (hypertension)   . Mitral regurgitation   . SVT (supraventricular tachycardia) (HCC)     Past Surgical History:  Procedure Laterality Date  . AORTIC VALVE REPLACEMENT  03/30/2011   #62mm [facility] Ease pericardial tissue valve  . CARDIOVERSION N/A 08/24/2016   Procedure: CARDIOVERSION;  Surgeon: Pricilla Riffle, MD;  Location: [facility] ENDOSCOPY;  Service: Cardiovascular;  Laterality: N/A;  . hysterctomy    . TONSILLECTOMY       Social History:  The patient  reports that she has never smoked. She has never used smokeless tobacco. She reports that she does not drink alcohol or use drugs.   Family History:  The patient's family history includes Colon cancer in her sister; Diabetes in her father and sister; Heart attack in her father; Stroke in her father.    ROS:  Please see the history of present illness. All other systems are reviewed and  Negative to the above problem except as noted.    PHYSICAL EXAM: VS:  BP 136/78   Pulse Marland Kitchen)  59   Ht 5\' 7"  (1.702 m)   Wt 238 lb (108 kg)   SpO2 98%   BMI 37.28 kg/m   GEN: Morbidly obese 66 yo iin no acute distress   Exmined in wheelchair   HEENT: normal  Neck: Neck full Cardiac: RRR; II/VI systolic murmur LSB to base   rubs, or gallops, 2+ edema  Respiratory:  clear to auscultation bilaterally, normal work of breathing GI: soft, nontender, nondistended, + BS  No hepatomegaly  MS: no deformity Moving all extremities   Skin: warm and dry, no rash  , chronic stasis changes.   Neuro:  Strength and sensation are intact Psych:    + cognative disabilities    EKG:  EKG is ordered today.  SB 58 bpm  Occasional PVC    Lipid Panel    Component Value Date/Time   CHOL 172 09/06/2011 1213   TRIG 169.0 (H) 09/06/2011 1213   HDL 51.70 09/06/2011 1213   CHOLHDL 3 09/06/2011 1213   VLDL 33.8 09/06/2011 1213   LDLCALC 87 09/06/2011 1213      Wt Readings from  Last 3 Encounters:  10/27/16 238 lb (108 kg)  09/29/16 245 lb 12.8 oz (111.5 kg)  08/24/16 239 lb (108.4 kg)      ASSESSMENT AND PLAN:  1  Atrial fib  s/p cardioverson  Maintaining SR  HR is slow  Will cut back on Ditl to 180 mg  Continue metoprolol   Has maintained NSR    2  Edema:     Still has significant edema Advised salt restriction Advised leg elevation.   3.   AV replacement :   [facility]  - normal LV systolic function Grade 2 diastolic dysfunction  Mild - mod TR , est. PA pressures of 45 mmHg.  Continue Lasix      Signed, Kristeen MissPhilip Nahser, MD  10/27/2016 10:27 AM    [facility] [address], [place], [place]  [address] Phone: [phone]; Fax: [phone]

## 2016-10-27 NOTE — Patient Instructions (Signed)
Medication Instructions: - Your physician recommends that you continue on your current medications as directed. Please refer to the Current Medication list given to you today.  Labwork: - Your physician recommends that you return for lab work in: 6 months- BMP- the day you see Dr. Elease HashimotoNahser  Procedures/Testing: - none ordered  Follow-Up: - Your physician wants you to follow-up in: 6 months with Dr. Elease HashimotoNahser. You will receive a reminder letter in the mail two months in advance. If you don't receive a letter, please call our office to schedule the follow-up appointment.  Any Additional Special Instructions Will Be Listed Below (If Applicable).     If you need a refill on your cardiac medications before your next appointment, please call your pharmacy.

## 2016-11-20 NOTE — Anesthesia Postprocedure Evaluation (Signed)
Anesthesia Post Note  Patient: Jennifer Bauer  Procedure(s) Performed: Procedure(s) (LRB): CARDIOVERSION (N/A)     Anesthesia Post Evaluation  Last Vitals:  Vitals:   08/24/16 1105 08/24/16 1112  BP: (!) 121/95 126/80  Pulse: 72 76  Resp: 18 18    Last Pain:  Vitals:   08/24/16 1008  TempSrc: Oral                 Kaeleigh Westendorf S

## 2016-11-20 NOTE — Addendum Note (Signed)
Addendum  created 11/20/16 1212 by Sesar Madewell, MD   Sign clinical note    

## 2017-02-22 ENCOUNTER — Encounter (HOSPITAL_COMMUNITY): Payer: Self-pay | Admitting: Emergency Medicine

## 2017-02-22 ENCOUNTER — Emergency Department (HOSPITAL_COMMUNITY): Payer: Medicaid Other

## 2017-02-22 ENCOUNTER — Inpatient Hospital Stay (HOSPITAL_COMMUNITY)
Admission: EM | Admit: 2017-02-22 | Discharge: 2017-03-09 | DRG: 292 | Disposition: A | Discharge status: Skilled Nursing Facility | Payer: Medicaid Other | Attending: Internal Medicine | Admitting: Internal Medicine

## 2017-02-22 DIAGNOSIS — R238 Other skin changes: Secondary | ICD-10-CM | POA: Diagnosis present

## 2017-02-22 DIAGNOSIS — R072 Precordial pain: Secondary | ICD-10-CM | POA: Diagnosis not present

## 2017-02-22 DIAGNOSIS — Z7901 Long term (current) use of anticoagulants: Secondary | ICD-10-CM

## 2017-02-22 DIAGNOSIS — S80822A Blister (nonthermal), left lower leg, initial encounter: Secondary | ICD-10-CM | POA: Diagnosis present

## 2017-02-22 DIAGNOSIS — Z6841 Body Mass Index (BMI) 40.0 and over, adult: Secondary | ICD-10-CM

## 2017-02-22 DIAGNOSIS — T501X5A Adverse effect of loop [high-ceiling] diuretics, initial encounter: Secondary | ICD-10-CM | POA: Diagnosis not present

## 2017-02-22 DIAGNOSIS — E876 Hypokalemia: Secondary | ICD-10-CM | POA: Diagnosis not present

## 2017-02-22 DIAGNOSIS — I4891 Unspecified atrial fibrillation: Secondary | ICD-10-CM | POA: Diagnosis not present

## 2017-02-22 DIAGNOSIS — N179 Acute kidney failure, unspecified: Secondary | ICD-10-CM

## 2017-02-22 DIAGNOSIS — R0902 Hypoxemia: Secondary | ICD-10-CM | POA: Diagnosis present

## 2017-02-22 DIAGNOSIS — I481 Persistent atrial fibrillation: Secondary | ICD-10-CM | POA: Diagnosis present

## 2017-02-22 DIAGNOSIS — M546 Pain in thoracic spine: Secondary | ICD-10-CM | POA: Diagnosis not present

## 2017-02-22 DIAGNOSIS — M1990 Unspecified osteoarthritis, unspecified site: Secondary | ICD-10-CM | POA: Diagnosis not present

## 2017-02-22 DIAGNOSIS — R17 Unspecified jaundice: Secondary | ICD-10-CM | POA: Diagnosis present

## 2017-02-22 DIAGNOSIS — R5381 Other malaise: Secondary | ICD-10-CM | POA: Diagnosis present

## 2017-02-22 DIAGNOSIS — G8929 Other chronic pain: Secondary | ICD-10-CM | POA: Diagnosis present

## 2017-02-22 DIAGNOSIS — J811 Chronic pulmonary edema: Secondary | ICD-10-CM

## 2017-02-22 DIAGNOSIS — Z79899 Other long term (current) drug therapy: Secondary | ICD-10-CM | POA: Diagnosis not present

## 2017-02-22 DIAGNOSIS — R0602 Shortness of breath: Secondary | ICD-10-CM

## 2017-02-22 DIAGNOSIS — R609 Edema, unspecified: Secondary | ICD-10-CM

## 2017-02-22 DIAGNOSIS — Z23 Encounter for immunization: Secondary | ICD-10-CM | POA: Diagnosis not present

## 2017-02-22 DIAGNOSIS — D509 Iron deficiency anemia, unspecified: Secondary | ICD-10-CM | POA: Diagnosis present

## 2017-02-22 DIAGNOSIS — R0789 Other chest pain: Secondary | ICD-10-CM

## 2017-02-22 DIAGNOSIS — I110 Hypertensive heart disease with heart failure: Secondary | ICD-10-CM | POA: Diagnosis present

## 2017-02-22 DIAGNOSIS — K30 Functional dyspepsia: Secondary | ICD-10-CM | POA: Diagnosis present

## 2017-02-22 DIAGNOSIS — I5033 Acute on chronic diastolic (congestive) heart failure: Secondary | ICD-10-CM | POA: Diagnosis present

## 2017-02-22 DIAGNOSIS — I272 Pulmonary hypertension, unspecified: Secondary | ICD-10-CM | POA: Diagnosis present

## 2017-02-22 DIAGNOSIS — Z952 Presence of prosthetic heart valve: Secondary | ICD-10-CM

## 2017-02-22 DIAGNOSIS — I5043 Acute on chronic combined systolic (congestive) and diastolic (congestive) heart failure: Secondary | ICD-10-CM | POA: Diagnosis not present

## 2017-02-22 DIAGNOSIS — X58XXXA Exposure to other specified factors, initial encounter: Secondary | ICD-10-CM | POA: Diagnosis present

## 2017-02-22 DIAGNOSIS — Z993 Dependence on wheelchair: Secondary | ICD-10-CM

## 2017-02-22 DIAGNOSIS — R109 Unspecified abdominal pain: Secondary | ICD-10-CM

## 2017-02-22 DIAGNOSIS — Z8249 Family history of ischemic heart disease and other diseases of the circulatory system: Secondary | ICD-10-CM | POA: Diagnosis not present

## 2017-02-22 DIAGNOSIS — R1013 Epigastric pain: Secondary | ICD-10-CM | POA: Diagnosis not present

## 2017-02-22 DIAGNOSIS — R011 Cardiac murmur, unspecified: Secondary | ICD-10-CM | POA: Diagnosis not present

## 2017-02-22 DIAGNOSIS — S80821A Blister (nonthermal), right lower leg, initial encounter: Secondary | ICD-10-CM | POA: Diagnosis present

## 2017-02-22 DIAGNOSIS — R740 Nonspecific elevation of levels of transaminase and lactic acid dehydrogenase [LDH]: Secondary | ICD-10-CM | POA: Diagnosis present

## 2017-02-22 DIAGNOSIS — I340 Nonrheumatic mitral (valve) insufficiency: Secondary | ICD-10-CM | POA: Diagnosis not present

## 2017-02-22 DIAGNOSIS — I350 Nonrheumatic aortic (valve) stenosis: Secondary | ICD-10-CM | POA: Diagnosis not present

## 2017-02-22 DIAGNOSIS — I5021 Acute systolic (congestive) heart failure: Secondary | ICD-10-CM

## 2017-02-22 DIAGNOSIS — R000 Tachycardia, unspecified: Secondary | ICD-10-CM | POA: Diagnosis not present

## 2017-02-22 HISTORY — DX: Acute on chronic diastolic (congestive) heart failure: I50.33

## 2017-02-22 HISTORY — DX: Other skin changes: R23.8

## 2017-02-22 LAB — COMPREHENSIVE METABOLIC PANEL
ALBUMIN: 3.3 g/dL — AB (ref 3.5–5.0)
ALT: 57 U/L — AB (ref 14–54)
AST: 92 U/L — AB (ref 15–41)
Alkaline Phosphatase: 97 U/L (ref 38–126)
Anion gap: 12 (ref 5–15)
BILIRUBIN TOTAL: 2.1 mg/dL — AB (ref 0.3–1.2)
BUN: 36 mg/dL — AB (ref 6–20)
CO2: 21 mmol/L — ABNORMAL LOW (ref 22–32)
CREATININE: 1.73 mg/dL — AB (ref 0.44–1.00)
Calcium: 8.8 mg/dL — ABNORMAL LOW (ref 8.9–10.3)
Chloride: 101 mmol/L (ref 101–111)
GFR calc Af Amer: 35 mL/min — ABNORMAL LOW (ref >60)
GFR, EST NON AFRICAN AMERICAN: 30 mL/min — AB (ref >60)
GLUCOSE: 141 mg/dL — AB (ref 65–99)
POTASSIUM: 5 mmol/L (ref 3.5–5.1)
Sodium: 134 mmol/L — ABNORMAL LOW (ref 135–145)
Total Protein: 6.7 g/dL (ref 6.5–8.1)

## 2017-02-22 LAB — CBC WITH DIFFERENTIAL/PLATELET
BASOS ABS: 0 10*3/uL (ref 0.0–0.1)
Basophils Relative: 0 %
EOS PCT: 0 %
Eosinophils Absolute: 0 10*3/uL (ref 0.0–0.7)
HEMATOCRIT: 30 % — AB (ref 36.0–46.0)
HEMOGLOBIN: 9.2 g/dL — AB (ref 12.0–15.0)
LYMPHS PCT: 20 %
Lymphs Abs: 1.6 10*3/uL (ref 0.7–4.0)
MCH: 22.8 pg — ABNORMAL LOW (ref 26.0–34.0)
MCHC: 30.7 g/dL (ref 30.0–36.0)
MCV: 74.4 fL — AB (ref 78.0–100.0)
MONOS PCT: 6 %
Monocytes Absolute: 0.5 10*3/uL (ref 0.1–1.0)
NEUTROS PCT: 74 %
Neutro Abs: 6.1 10*3/uL (ref 1.7–7.7)
Platelets: 210 10*3/uL (ref 150–400)
RBC: 4.03 MIL/uL (ref 3.87–5.11)
RDW: 15.6 % — AB (ref 11.5–15.5)
WBC: 8.2 10*3/uL (ref 4.0–10.5)

## 2017-02-22 LAB — TYPE AND SCREEN
ABO/RH(D): B POS
ANTIBODY SCREEN: NEGATIVE

## 2017-02-22 LAB — BRAIN NATRIURETIC PEPTIDE: B Natriuretic Peptide: 1588.4 pg/mL — ABNORMAL HIGH (ref 0.0–100.0)

## 2017-02-22 LAB — I-STAT TROPONIN, ED: TROPONIN I, POC: 0.03 ng/mL (ref 0.00–0.08)

## 2017-02-22 LAB — PROTIME-INR
INR: 4.33
PROTHROMBIN TIME: 41.2 s — AB (ref 11.4–15.2)

## 2017-02-22 MED ORDER — DILTIAZEM HCL 25 MG/5ML IV SOLN
10.0000 mg | Freq: Once | INTRAVENOUS | Status: AC
  Administered 2017-02-22: 10 mg via INTRAVENOUS
  Filled 2017-02-22: qty 5

## 2017-02-22 MED ORDER — DILTIAZEM HCL 100 MG IV SOLR
5.0000 mg/h | INTRAVENOUS | Status: DC
  Administered 2017-02-22 – 2017-02-23 (×3): 12.5 mg/h via INTRAVENOUS
  Administered 2017-02-24: 10 mg/h via INTRAVENOUS
  Administered 2017-02-24: 12.5 mg/h via INTRAVENOUS
  Administered 2017-02-25 (×2): 10 mg/h via INTRAVENOUS
  Administered 2017-02-26: 15 mg/h via INTRAVENOUS
  Administered 2017-02-26: 10 mg/h via INTRAVENOUS
  Administered 2017-02-27 (×2): 15 mg/h via INTRAVENOUS
  Filled 2017-02-22 (×13): qty 100

## 2017-02-22 MED ORDER — FUROSEMIDE 80 MG PO TABS
80.0000 mg | ORAL_TABLET | Freq: Two times a day (BID) | ORAL | Status: DC
  Administered 2017-02-22 – 2017-02-26 (×8): 80 mg via ORAL
  Filled 2017-02-22 (×2): qty 1
  Filled 2017-02-22: qty 4
  Filled 2017-02-22 (×5): qty 1

## 2017-02-22 MED ORDER — RIVAROXABAN 20 MG PO TABS
20.0000 mg | ORAL_TABLET | Freq: Every day | ORAL | Status: DC
Start: 2017-02-22 — End: 2017-02-22
  Filled 2017-02-22: qty 1

## 2017-02-22 MED ORDER — METOPROLOL SUCCINATE ER 100 MG PO TB24: 100.0000 mg | ORAL_TABLET | Freq: Two times a day (BID) | ORAL | Status: DC

## 2017-02-22 MED ORDER — POTASSIUM CHLORIDE CRYS ER 20 MEQ PO TBCR: 40.0000 meq | EXTENDED_RELEASE_TABLET | Freq: Two times a day (BID) | ORAL | Status: DC

## 2017-02-22 MED ORDER — PNEUMOCOCCAL VAC POLYVALENT 25 MCG/0.5ML IJ INJ
0.5000 mL | INJECTION | INTRAMUSCULAR | Status: AC
  Administered 2017-02-23: 0.5 mL via INTRAMUSCULAR
  Filled 2017-02-22: qty 0.5

## 2017-02-22 MED ORDER — ONDANSETRON HCL 4 MG PO TABS: 4.0000 mg | ORAL_TABLET | Freq: Four times a day (QID) | ORAL | Status: DC | PRN

## 2017-02-22 MED ORDER — ONDANSETRON HCL 4 MG/2ML IJ SOLN
4.0000 mg | Freq: Four times a day (QID) | INTRAMUSCULAR | Status: DC | PRN
Start: 2017-02-22 — End: 2017-02-27
  Administered 2017-02-25: 4 mg via INTRAVENOUS
  Filled 2017-02-22: qty 2

## 2017-02-22 MED ORDER — METOPROLOL TARTRATE 50 MG PO TABS
50.0000 mg | ORAL_TABLET | Freq: Two times a day (BID) | ORAL | Status: DC
  Administered 2017-02-22 – 2017-02-27 (×10): 50 mg via ORAL
  Filled 2017-02-22 (×4): qty 2
  Filled 2017-02-22: qty 1
  Filled 2017-02-22: qty 2
  Filled 2017-02-22: qty 1
  Filled 2017-02-22 (×3): qty 2

## 2017-02-22 MED ORDER — RIVAROXABAN 15 MG PO TABS
15.0000 mg | ORAL_TABLET | Freq: Every day | ORAL | Status: DC
  Filled 2017-02-22: qty 1

## 2017-02-22 MED ORDER — PANTOPRAZOLE SODIUM 40 MG PO TBEC
40.0000 mg | DELAYED_RELEASE_TABLET | Freq: Every day | ORAL | Status: DC
  Administered 2017-02-22 – 2017-02-28 (×7): 40 mg via ORAL
  Filled 2017-02-22 (×7): qty 1

## 2017-02-22 MED ORDER — CITALOPRAM HYDROBROMIDE 20 MG PO TABS
20.0000 mg | ORAL_TABLET | Freq: Every day | ORAL | Status: DC
  Administered 2017-02-23 – 2017-03-09 (×15): 20 mg via ORAL
  Filled 2017-02-22 (×6): qty 1
  Filled 2017-02-22: qty 2
  Filled 2017-02-22 (×2): qty 1
  Filled 2017-02-22 (×2): qty 2
  Filled 2017-02-22: qty 1
  Filled 2017-02-22: qty 2
  Filled 2017-02-22 (×2): qty 1
  Filled 2017-02-22: qty 2

## 2017-02-22 MED ORDER — FUROSEMIDE 10 MG/ML IJ SOLN
40.0000 mg | Freq: Once | INTRAMUSCULAR | Status: AC
  Administered 2017-02-22: 40 mg via INTRAVENOUS
  Filled 2017-02-22: qty 4

## 2017-02-22 MED ORDER — TRAMADOL HCL 50 MG PO TABS
100.0000 mg | ORAL_TABLET | Freq: Two times a day (BID) | ORAL | Status: DC
  Administered 2017-02-22 – 2017-03-09 (×30): 100 mg via ORAL
  Filled 2017-02-22 (×30): qty 2

## 2017-02-22 MED ORDER — POLYETHYLENE GLYCOL 3350 17 G PO PACK
17.0000 g | PACK | Freq: Every day | ORAL | Status: DC | PRN
Start: 2017-02-22 — End: 2017-03-09

## 2017-02-22 MED ORDER — DILTIAZEM HCL 100 MG IV SOLR
5.0000 mg/h | Freq: Once | INTRAVENOUS | Status: AC
  Administered 2017-02-22: 5 mg/h via INTRAVENOUS
  Filled 2017-02-22: qty 100

## 2017-02-22 NOTE — Consult Note (Addendum)
Cardiology Consultation:   Patient ID: Jennifer Bauer; [id number]; December 18, 1950   Admit date: 02/22/2017 Date of Consult: 02/22/2017  Primary Care Provider: Lonie Peak, PA-C Primary Cardiologist: Dr Elease Hashimoto Primary Electrophysiologist:  n/a   Patient Profile:   Jennifer Bauer is a 66 y.o. female with a hx of HTN, AoV dz (s/p AVR 2012), atrial fib (s/p DCCV), bradycardia on higher doses of CCB, LE cellulitis, and hypokalemia who is being seen today for the evaluation of CHF and atrial fib at the request of Dr Silverio Lay. She has been given amio in the past, which helped her convert to SR.  History of Present Illness:   [name] has been on Lasix 80 mg bid for a while. She is at a facility (?Assisted Living).   Says her weight has increased from 259->272 recently. She does not seem to be on a low-sodium or decreased calorie diet.   She believes she went into atrial fib on Monday.  She does not have any palpitations, but has been dizzy.   The main thing she has noticed is an increasing DOE. She can barely do anything. Her legs have been swelling to the point that she has had weeping blisters. Her legs have been wrapped for the last 2-3 weeks. She has not been sleeping much because of upper abd and/or lower chest pain.   She had some chest pain last week, but it got worse this week. Tuesday night, her heart started beating fast and she got a squeezing pain. She has trouble defining this more exactly, but the rapid HR and the squeezing seem to go together. The pain was continuous since Tuesday night. By this am, her nurse at [facility] felt help was needed and she came to the ER.  She has episodic dizziness, it come 1-2 x week. She will also feel hot. No association with exertion. The room will spin. She will sit quietly put a cold rag on her face. The symptoms would get better.   She has upper abdominal discomfort, says this started Monday also. She had a BM yesterday. She is not sure if she has  problems with constipation.   She has a history of knee pain and used to take ibuprofen 4 x day, that would help.   She was nauseated this am, sipped a little coffee, but vomited it up. Has not had anything since then.   Past Medical History:  Diagnosis Date  . Acute on chronic diastolic CHF (congestive heart failure), NYHA class 4 (HCC) 02/22/2017  . Aortic insufficiency and aortic stenosis    s/p Tissue AVR with Dr. Cornelius Moras 03/2011; preop LHC with normal cors;  pre op carotid dopplers neg for ICA stenosis  . Arthritis   . Atrial fibrillation (HCC)    post op after AVR 10/12  . HTN (hypertension)   . Mitral regurgitation   . SVT (supraventricular tachycardia) (HCC)     Past Surgical History:  Procedure Laterality Date  . AORTIC VALVE REPLACEMENT  03/30/2011   #88mm [facility] Ease pericardial tissue valve  . CARDIOVERSION N/A 08/24/2016   Procedure: CARDIOVERSION;  Surgeon: Pricilla Riffle, MD;  Location: [facility] ENDOSCOPY;  Service: Cardiovascular;  Laterality: N/A;  . hysterctomy    . TONSILLECTOMY       Inpatient Medications: Scheduled Meds:  Continuous Infusions: Cardizem IV 5 mg/hr PRN Meds:  Prior to Admission medications   Medication Sig Start Date End Date Taking? Authorizing Provider  acetaminophen (TYLENOL) 325 MG tablet Take  650 mg by mouth as directed.    [provider]  citalopram (CELEXA) 20 MG tablet Take 20 mg by mouth daily.    [provider]  clonazePAM (KLONOPIN) 0.5 MG tablet Take 0.5 mg by mouth 2 (two) times daily.    [provider]  diltiazem (CARDIZEM CD) 180 MG 24 hr capsule Take 1 capsule (180 mg total) by mouth daily. 09/29/16 12/28/16  Pricilla Riffleoss, Paula V, MD  furosemide (LASIX) 80 MG tablet Take 80 mg by mouth 2 (two) times daily.    [provider]  metoprolol succinate (TOPROL-XL) 100 MG 24 hr tablet Take 100 mg by mouth 2 (two) times daily. Take with or immediately following a meal.     [provider]    potassium chloride SA (K-DUR,KLOR-CON) 20 MEQ tablet Take 2 tablets (40 mEq total) by mouth 2 (two) times daily. 08/18/16   Janetta Horahompson, Kathryn R, PA-C  rivaroxaban (XARELTO) 20 MG TABS tablet Take 20 mg by mouth daily.    [provider]  traMADol (ULTRAM) 50 MG tablet Take 50 mg by mouth 2 (two) times daily.    [provider]    Allergies:    Allergies  Allergen Reactions  . Acetaminophen     tachycardia    Social History:   Social History   Social History  . Marital status: Single    Spouse name: N/A  . Number of children: 0  . Years of education: N/A   Occupational History  . Retired    Social History Main Topics  . Smoking status: Never Smoker  . Smokeless tobacco: Never Used  . Alcohol use No  . Drug use: No  . Sexual activity: Not on file   Other Topics Concern  . Not on file   Social History Narrative   Currently [facility] Assisted Living with her sister.    Family History:   The patient's family history includes Colon cancer in her sister; Diabetes in her father and sister; Heart attack in her father; Stroke in her father. Pt indicated that the status of her father is unknown.    ROS:  Please see the history of present illness.  All other ROS reviewed and negative.      Physical Exam/Data:   Vitals:   02/22/17 1230 02/22/17 1245 02/22/17 1302 02/22/17 1315  BP: 97/62 97/71 (!) 92/58 93/61  Pulse: (!) 117 (!) 133 (!) 114 (!) 110  Resp: (!) 25 19 (!) 23 (!) 21  Temp:      TempSrc:      SpO2: 95% 96% 94% 94%   No intake or output data in the 24 hours ending 02/22/17 1403 There were no vitals filed for this visit. There is no height or weight on file to calculate BMI.  General:  Well nourished, well developed, in moderate respiratory distress HEENT: normal Lymph: no adenopathy Neck: JVD 11 cm Endocrine:  No thryomegaly Vascular: No carotid bruits; upper extremity pulses 2+ bilaterally,  lower extremities difficult to feel pulses due to edema, cap refill wnl Cardiac:  normal S1, S2; Rapid and Irreg R&R; 2/6 murmur  Lungs:  Decreased BS bases bilaterally, no wheezing, rhonchi or rales  Abd: soft, tender upper abdomen, worse in the middle, no hepatomegaly  Ext: 2-3+ LE edema Musculoskeletal:  No deformities, BUE and BLE strength normal and equal Skin: warm and dry, multiple blisters on both LE, currently dry but dressings are wet Neuro:  CNs  2-12 intact, no focal abnormalities noted Psych:  Normal affect   EKG:  The EKG was personally reviewed and demonstrates:  Atrial fib, HR 148 Telemetry:  Telemetry was personally reviewed and demonstrates:  Atrial fib, HR 110s-130s  Relevant CV Studies: ECHO: 03/[facility]/2016 at [facility] EF 55%-60% Marked LA dilatation S/p Bioprosthetic AVR w/ mean grad 24, max grad 42 mm Hg RV pressure 55  Laboratory Data:  Chemistry  Recent Labs Lab 02/22/17 1030  NA 134*  K 5.0  CL 101  CO2 21*  GLUCOSE 141*  BUN 36*  CREATININE 1.73*  CALCIUM 8.8*  GFRNONAA 30*  GFRAA 35*  ANIONGAP 12     Recent Labs Lab 02/22/17 1030  PROT 6.7  ALBUMIN 3.3*  AST 92*  ALT 57*  ALKPHOS 97  BILITOT 2.1*   Hematology  Recent Labs Lab 02/22/17 1030  WBC 8.2  RBC 4.03  HGB 9.2*  HCT 30.0*  MCV 74.4*  MCH 22.8*  MCHC 30.7  RDW 15.6*  PLT 210   Cardiac EnzymesNo results for input(s): TROPONINI in the last 168 hours.   Recent Labs Lab 02/22/17 1047  TROPIPOC 0.03    BNP  Recent Labs Lab 02/22/17 1030  BNP 1,588.4*     Radiology/Studies:  Dg Chest Port 1 View Result Date: 02/22/2017 CLINICAL DATA:  Chest pain and shortness of breath over the last few days. Weakness. EXAM: PORTABLE CHEST 1 VIEW COMPARISON:  06/20/2016 FINDINGS: Previous median sternotomy and aortic valve replacement. Mild cardiomegaly. Aortic atherosclerosis. The lungs are clear. No edema. Chronic calcified granuloma on the left as seen previously. No  significant bone finding. No effusions. IMPRESSION: No active disease by radiography. Chronic cardiomegaly and aortic atherosclerosis. No pulmonary edema or pleural effusions. Electronically Signed   By: Paulina Fusi M.D.   On: 02/22/2017 10:55    Assessment and Plan:   Principal Problem:   Acute on chronic diastolic CHF (congestive heart failure), NYHA class 4 (HCC) - unknown dry weight - low BP is limiting options - will discuss with MD, feel Lasix gtt +/- Levo for BP support may be best option - she will need stepdown - recheck echo - follow renal function and electrolytes  Active Problems:   Atrial fibrillation with rapid ventricular response (HCC) - currently on Cardizem 5 mg IV - BP limits up-titration - may need amio short-term  - unknown duration, may need to do DCCV    S/P AVR (aortic valve replacement) - ck echo    Physical deconditioning - spends most of her time in a wheelchair - unclear how much more DOE she has    Multiple blisters both lower legs - will get wound care consult, may need Unna boots    Chronic anticoagulation - according to notes from facility, pt has been on Xarelto - if DCCV desired, need admin records for med.      Melida Quitter, PA-C  02/22/2017 2:03 PM  History and all data above reviewed.  Patient examined.  I agree with the findings as above.  The patient presents with lower extremity edema, rapid heart rate and nausea.  She has a history of atrial fib but was in NSR at the time of her last EKG.  She thinks her HR has been high for about a month.  She has leg edema but she is not sure if this is changed from baseline.  she does not get daily weights.  She was sent today because of nausea and decreased appetite.  She is found to have atrial fib with rapid rate, low BP, creat is elevated and BP is low and she has worsening anemia.  She complains of abdominal pain and pain with palpation. The patient exam reveals ION:[id number]  ,   Lungs: Clear  ,  Abd: Positive bowel sounds, no rebound no guarding, Ext   Severe edema to the mid thigh.   .  All available labs, radiology testing, previous records reviewed. Agree with documented assessment and plan. ATRIAL FIB:  Probably ongoing for many days (we found a HR of 135 on 9/4).  She will likely need cardioversion.  We could potentially do this tomorrow after we check an echo and verify that she has been on Xarelto.  We will check TSH.  EDEMA:  For now I will actually not be aggressive with her diuresis. since her creat is elevated.  She needs to keep her legs up (they are apparently on down quite a bit.)  She is not in pulmonary edema and is oxygenating well. Fayrene Fearing Haruna Rohlfs  2:42 PM  02/22/2017   Addendum: Records received from [facility]. MAR reviewed and Xarelto is documented as given every day in August and September so far. Records in paper chart.  Spoke with Rosann Auerbach, no DCCV slots available tomorrow.  May have to look at doing this on Saturday.  Theodore Demark, Cordelia Poche 02/22/2017 3:22 PM Beeper [phone]

## 2017-02-22 NOTE — ED Notes (Signed)
periwick placed.

## 2017-02-22 NOTE — ED Triage Notes (Signed)
Pt from nursing home [facility] [facility])  and presents with afib RVR. Shortness of breath. Staff report that she has been pale for week. On xarelto. Legs wrapped in gauze and coban for weeping. Pt alert and oriented.

## 2017-02-22 NOTE — H&P (Signed)
Date: 02/22/2017               Patient Name:  Jennifer Bauer MRN: [id number]  DOB: 07-11-1950 Age / Sex: 66 y.o., female   PCP: Lonie Peak, PA-C         Medical Service: Internal Medicine Teaching Service         Attending Physician: Dr. Gust Rung, DO    First Contact: Dr. Crista Elliot Pager: [phone]  Second Contact: Dr. Nelson Chimes Pager: [phone]       After Hours (After 5p/  First Contact Pager: [phone]  weekends / holidays): Second Contact Pager: [phone]   Chief Complaint: "Can't get my breath"  History of Present Illness:  Jennifer Bauer is a 66 yo F with PMHx. Of Afib s/p cardioversion (08/2016), and A.S s/p BMW(4132). She presented with Shortness of breath and palpitation. As per patient, since Monday morning, she has had dyspnea on exertion when walking few steps from her wheelchair to bathroom. She also had palpitation as well as pounding chest. The pain was mostly in her mid-chest, with no radiation. With severity of 6/10. Her pain was intermittent and she is not clear about duration and frequency of pain. Report some dizziness with no pre-syncope or syncope. Had an episode of vomiting this morning. No hematemesis. Her dyspnea worsened gradually, associated with sever lower legs swelling and orthopnea. She mentions that she has not slept in last 2 nights due to difficulty breathing. Also mentions weight gain recently. She lives in nursing home and is wheelchair bound but used to take few steps to bathroom without problem. Having her symptom worsened, EMS was called and she found to have A fib with rate of 160-170 stable vital signs otherwise.  In the ED, BMP was performed and indicated result of greater than 1500. CBC indicating anemia of 9.2, CMP indicated 1.73 but was otherwise not significantly changed from previous studies. Troponin was negative, INR was elevated but not useful with xarelto. Chest x-ray showed no acute cardiopulmonary disease.  Meds:  Current Meds   Medication Sig  . acetaminophen (TYLENOL) 325 MG tablet Take 650 mg by mouth as directed.  . citalopram (CELEXA) 20 MG tablet Take 20 mg by mouth daily.  Marland Kitchen diltiazem (CARDIZEM CD) 180 MG 24 hr capsule Take 1 capsule (180 mg total) by mouth daily.  . furosemide (LASIX) 80 MG tablet Take 80 mg by mouth 2 (two) times daily.  . metoprolol succinate (TOPROL-XL) 100 MG 24 hr tablet Take 100 mg by mouth 2 (two) times daily. Take with or immediately following a meal.   . metoprolol tartrate (LOPRESSOR) 50 MG tablet Take 50 mg by mouth as needed.  . ondansetron (ZOFRAN) 4 MG tablet Take 4 mg by mouth every 6 (six) hours as needed for nausea or vomiting.  . pantoprazole (PROTONIX) 40 MG tablet Take 40 mg by mouth daily.  . potassium chloride SA (K-DUR,KLOR-CON) 20 MEQ tablet Take 2 tablets (40 mEq total) by mouth 2 (two) times daily.  . rivaroxaban (XARELTO) 20 MG TABS tablet Take 20 mg by mouth daily.  . silver sulfADIAZINE (SILVADENE) 1 % cream Apply 1 application topically daily. Blisters on her legs  . traMADol (ULTRAM) 50 MG tablet Take 100 mg by mouth 2 (two) times daily.      Allergies: Allergies as of 02/22/2017 - Review Complete 02/22/2017  Allergen Reaction Noted  . Acetaminophen  04/18/2011   Past Medical History:  Diagnosis Date  . Acute on chronic  diastolic CHF (congestive heart failure), NYHA class 4 (HCC) 02/22/2017  . Aortic insufficiency and aortic stenosis    s/p Tissue AVR with Dr. Cornelius Moraswen 03/2011; preop LHC with normal cors;  pre op carotid dopplers neg for ICA stenosis  . Arthritis   . Atrial fibrillation (HCC)    post op after AVR 10/12  . HTN (hypertension)   . Mitral regurgitation   . Multiple blisters both lower legs 02/22/2017  . SVT (supraventricular tachycardia) (HCC)     Family History:  Family History  Problem Relation Age of Onset  . Heart attack Father   . Stroke Father   . Diabetes Father   . Colon cancer Sister        heavy smoker  . Diabetes Sister      Social History:  Social History   Social History  . Marital status: Single    Spouse name: N/A  . Number of children: 0  . Years of education: N/A   Occupational History  . Retired    Social History Main Topics  . Smoking status: Never Smoker  . Smokeless tobacco: Never Used  . Alcohol use No  . Drug use: No  . Sexual activity: Not on file   Other Topics Concern  . Not on file   Social History Narrative   [facility]isted Living with her sister.    Review of Systems: A complete ROS was negative except as per HPI.   Physical Exam: Blood pressure 111/77, pulse (!) 112, temperature 98 F (36.7 C), temperature source Temporal, resp. rate (!) 21, SpO2 97 %. Physical Exam  Constitutional: She is oriented to person, place, and time. She appears well-developed and well-nourished. No distress.  HENT:  Head: Normocephalic and atraumatic.  Eyes: Conjunctivae and EOM are normal.  Neck: Normal range of motion. Neck supple. No JVD present.  Cardiovascular: Normal rate.  An irregularly irregular rhythm present.  Murmur heard.  Systolic murmur is present with a grade of 3/6  Pulmonary/Chest: Effort normal. No stridor. No respiratory distress. She has no wheezes.  Abdominal: Soft. Bowel sounds are normal. She exhibits no distension. There is no tenderness.  Musculoskeletal: She exhibits edema. She exhibits no tenderness.  Neurological: She is alert and oriented to person, place, and time.  Skin: Skin is warm. Capillary refill takes less than 2 seconds. She is not diaphoretic.  Psychiatric: She has a normal mood and affect.    EKG: personally reviewed my interpretation is A-fib w/ RVR  CXR: personally reviewed my interpretation is, no acute cardio pulmonary process. Cardiomegaly as seen on prior studies  Assessment & Plan by Problem: Principal Problem:   Acute on chronic diastolic CHF (congestive heart failure), NYHA  class 4 (HCC) Active Problems:   S/P AVR (aortic valve replacement)   Atrial fibrillation with rapid ventricular response (HCC)   Physical deconditioning   Multiple blisters both lower legs   Chronic anticoagulation  Assesment and plan:   1- DOE, LE edema: in setting of CHF exacerbation. She is on home dose Furosemide of 80 mg BID. BP:113/75. Pro-BNP:1588, Last Pro-BNP (2017) :3070  Last Echo (06/2016): Diastolic dysfunction - grade II. Normal left ventricular systolic function, EF: 55 to 60%. Current BP is Cardiology consulted and placed the order for  -Continue home does of Furosemide: 80 mg po BID  -Echo -Patient's O2 saturation is satisfactory on room air. Does not appear to have significant pulmonary edema,  2- Afib: Symptomatic,  Currently afib on ECG, Has been on Xarelto. Hx of cardioversion (08/2016). Cardiology consulted and placed orders as bellow:  -Continue Xarelto  -Diltiazem 7.5 mg IV and titrating upward -Scheduling for cardioversion   3- Microcytic anemia. Hb =9.2, MCV=74. Her baseline between Hb has been around 9-11 recently. Seems to be chronic.  -Monitor CBC daily  -Check Ferritin  -Follow up in clinic   4- AKI: Mild. Cr=1.72, BUN/Cr ratio:20.8 Seems to have pre-renal etiology  -Monitor Cr while on diuretic   5- Transaminitis: AST:92, ALT:57 . Previously normal. Most likely due to hypoperfusion/hypoxia in setting of recent CHF exacerbation and Afib   Diet: NPO Fluids: none Code: Full DVT ppx: Xarelto  Dispo: Admit patient to Inpatient with expected length of stay greater than 2 midnights.  Signed: Lanelle Bal, MD 02/22/2017, 3:20 PM  Pager: Pager# [phone]  Internal Medicine Attending:   I saw and examined the patient. I reviewed the resident's note and I agree with the resident's findings and plan as documented in the resident's note with the following additions/corrections  Briefly Jennifer Bauer is a 66 year old female with history of A.  fib, aortic stenosis status post aortic valve repair who presents from skilled nursing facility due to increased dyspnea on exertion and palpitations. 42 days prior to presentation she was having palpitations she was noted to be in A. fib with RVR attempt to control her rate and skilled nurse facility not successful and this has led to continued increased shortness of breath. In the emergency department she was found to be in A. fib with rapid ventricular rate of 160s to 170s although vitals were otherwise stable. She was evaluated by cardiology who felt she would benefit from another cardioversion to restore sinus rhythm. She was admitted on a diltiazem drip with plans to cardiovert this morning. Overnight her rate has become controlled and is in the 100s to 110s on my evaluation however she remains symptomatic with reports of continued shortness of breath as well as palpitations. Dyspnea note and INR was checked in the emergency department was elevated however she has no signs of bleeding and is currently anticoagulated with Xarelto.  On my exam: Elderly female uncomfortable, lungs grossly clear to auscultation bilaterally, heart tachycardic and irregularly irregular, I did not appreciate a murmur, abdomen soft nontender, 2+ lower extremity edema to thigh bilaterally.  Chest x-ray: Personally reviewed cardiomegaly and atherosclerosis noted no appreciated pulmonary edema  A/P Acute on chronic diastolic congestive heart failure secondary to A. fib with RVR - Appreciate cardiology's assistance, holding diuresis for now plan to cardiovert today. -She is currently rate controlled on diltiazem drip -Continue Xarelto Repeat echo pending  AK I -Likely secondary to problem 1, appears to be improving with rate control   Gust Rung, DO  02/23/17 11:26 AM

## 2017-02-22 NOTE — ED Provider Notes (Signed)
[facility]-EM[facility]Y DEPT Provider Note   CSN: [id number] Arrival date & time: 02/22/17  1033     History   Chief Complaint Chief Complaint  Patient presents with  . Shortness of Breath    HPI Jennifer Bauer is a 66 y.o. female hx of afib on coumadin, aortic stenosis, Who presenting with palpitations, shortness of breath. Patient is from a nursing home currently and was complaining of some palpitations and shortness of breath. Patient states that she is wheelchair bound at baseline but does get very short of breath over the last several days when she stands up. She is taking her Coumadin as prescribed for atrial fibrillation. Patient states that she does have more leg swelling as well. EMS was called and she was noted to be tachycardic at about 160 to 170s, rapid A. Fib, vital signs otherwise stable. Has chronic bilateral leg swelling that is stable.   The history is provided by the patient.    Past Medical History:  Diagnosis Date  . Aortic insufficiency and aortic stenosis    s/p Tissue AVR with Dr. Cornelius Moraswen 03/2011; preop LHC with normal cors;  pre op carotid dopplers neg for ICA stenosis  . Arthritis   . Atrial fibrillation (HCC)    post op after AVR 10/12  . HTN (hypertension)   . Mitral regurgitation   . SVT (supraventricular tachycardia) [facility](HCC)     Patient Active Problem List   Diagnosis Date Noted  . Chronic diastolic congestive heart failure (HCC) 10/27/2016  . PAF (paroxysmal atrial fibrillation) (HCC) 04/18/2011  . Mitral regurgitation   . HTN (hypertension)   . Tachycardia   . Arthritis   . S/P AVR (aortic valve replacement) 03/30/2011    Past Surgical History:  Procedure Laterality Date  . AORTIC VALVE REPLACEMENT  03/30/2011   [facility]ard[facility]  . CARDIOVERSION N/A 08/24/2016   Procedure: CARDIOVERSION;  Surgeon: Pricilla RifflePaula Ross V, MD;  [facility];  Service: Cardiovascular;  Laterality: N/A;  . hysterctomy    . TONSILLECTOMY       OB History    No data available       Home Medications    Prior to Admission medications   Medication Sig Start Date End Date Taking? Authorizing Provider  acetaminophen (TYLENOL) 325 MG tablet Take 650 mg by mouth as directed.    [provider]  citalopram (CELEXA) 20 MG tablet Take 20 mg by mouth daily.    [provider]  clonazePAM (KLONOPIN) 0.5 MG tablet Take 0.5 mg by mouth 2 (two) times daily.    [provider]  diltiazem (CARDIZEM CD) 180 MG 24 hr capsule Take 1 capsule (180 mg total) by mouth daily. 09/29/16 12/28/16  Pricilla Riffleoss, Paula V, MD  furosemide (LASIX) 80 MG tablet Take 80 mg by mouth 2 (two) times daily.    [provider]  metoprolol succinate (TOPROL-XL) 100 MG 24 hr tablet Take 100 mg by mouth 2 (two) times daily. Take with or immediately following a meal.     [provider]  potassium chloride SA (K-DUR,KLOR-CON) 20 MEQ tablet Take 2 tablets (40 mEq total) by mouth 2 (two) times daily. 08/18/16   Janetta Horahompson, Kathryn R, PA-C  rivaroxaban (XARELTO) 20 MG TABS tablet Take 20 mg by mouth daily.    [provider]  traMADol (ULTRAM) 50 MG tablet Take 50 mg by mouth 2 (two) times daily.    [provider]    Family History Family  History  Problem Relation Age of Onset  . Heart attack Father   . Stroke Father   . Diabetes Father   . Colon cancer Sister        heavy smoker  . Diabetes Sister     Social History Social History  Substance Use Topics  . Smoking status: Never Smoker  . Smokeless tobacco: Never Used  . Alcohol use No     Allergies   Acetaminophen   Review of Systems Review of Systems  Respiratory: Positive for shortness of breath.   All other systems reviewed and are negative.    Physical Exam Updated Vital Signs BP 99/66   Pulse (!) 132   Temp 98 F (36.7 C) (Temporal)   Resp (!) 21   SpO2 95%   Physical Exam  Constitutional: She is oriented to person, place, and time.   Slightly tachypneic   HENT:  Head: Normocephalic.  Mouth/Throat: Oropharynx is clear and moist.  Eyes: Pupils are equal, round, and reactive to light. Conjunctivae and EOM are normal.  Neck: Normal range of motion. Neck supple.  Cardiovascular:  Tachy, irregular   Pulmonary/Chest:  Slightly tachypneic, crackles bilateral bases   Abdominal: Soft. Bowel sounds are normal. She exhibits no distension. There is no tenderness.  Musculoskeletal:  2+ edema bilateral legs   Neurological: She is alert and oriented to person, place, and time.  Skin: Skin is warm.  Psychiatric: She has a normal mood and affect.  Nursing note and vitals reviewed.    ED Treatments / Results  Labs (all labs ordered are listed, but only abnormal results are displayed) Labs Reviewed  CBC WITH DIFFERENTIAL/PLATELET - Abnormal; Notable for the following:       Result Value   Hemoglobin 9.2 (*)    HCT 30.0 (*)    MCV 74.4 (*)    MCH 22.8 (*)    RDW 15.6 (*)    All other components within normal limits  COMPREHENSIVE METABOLIC PANEL - Abnormal; Notable for the following:    Sodium 134 (*)    CO2 21 (*)    Glucose, Bld 141 (*)    BUN 36 (*)    Creatinine, Ser 1.73 (*)    Calcium 8.8 (*)    Albumin 3.3 (*)    AST 92 (*)    ALT 57 (*)    Total Bilirubin 2.1 (*)    GFR calc non Af Amer 30 (*)    GFR calc Af Amer 35 (*)    All other components within normal limits  BRAIN NATRIURETIC PEPTIDE - Abnormal; Notable for the following:    B Natriuretic Peptide 1,588.4 (*)    All other components within normal limits  PROTIME-INR - Abnormal; Notable for the following:    Prothrombin Time 41.2 (*)    All other components within normal limits  I-STAT TROPONIN, ED  TYPE AND SCREEN    EKG  EKG Interpretation None       Radiology Dg Chest Port 1 View  Result Date: 02/22/2017 CLINICAL DATA:  Chest pain and shortness of breath over the last few days. Weakness. EXAM: PORTABLE CHEST 1 VIEW COMPARISON:   06/20/2016 FINDINGS: Previous median sternotomy and aortic valve replacement. Mild cardiomegaly. Aortic atherosclerosis. The lungs are clear. No edema. Chronic calcified granuloma on the left as seen previously. No significant bone finding. No effusions. IMPRESSION: No active disease by radiography. Chronic cardiomegaly and aortic atherosclerosis. No pulmonary edema or pleural effusions. Electronically Signed   By: Loraine Leriche  Shogry M.D.   On: 02/22/2017 10:55    Procedures Procedures (including critical care time)  CRITICAL CARE Performed by: Richardean Canal   Total critical care time: 30 minutes  Critical care time was exclusive of separately billable procedures and treating other patients.  Critical care was necessary to treat or prevent imminent or life-threatening deterioration.  Critical care was time spent personally by me on the following activities: development of treatment plan with patient and/or surrogate as well as nursing, discussions with consultants, evaluation of patient's response to treatment, examination of patient, obtaining history from patient or surrogate, ordering and performing treatments and interventions, ordering and review of laboratory studies, ordering and review of radiographic studies, pulse oximetry and re-evaluation of patient's condition.   Medications Ordered in ED Medications  diltiazem (CARDIZEM) 100 mg in dextrose 5 % 100 mL (1 mg/mL) infusion (not administered)  diltiazem (CARDIZEM) injection 10 mg (10 mg Intravenous Given 02/22/17 1045)  furosemide (LASIX) injection 40 mg (40 mg Intravenous Given 02/22/17 1138)     Initial Impression / Assessment and Plan / ED Course  I have reviewed the triage vital signs and the nursing notes.  Pertinent labs & imaging results that were available during my care of the patient were reviewed by me and considered in my medical decision making (see chart for details).  Clinical Course as of Feb 22 1446  Thu Feb 22, 2017    1316 Creatinine: (!) 1.73 [CK]    Clinical Course User Index [CK] Jennifer Bauer, Student-PA    MARQUETTE PIONTEK is a 66 y.o. female here with rapid afib. Also appears in heart failure. Will get labs, BNP, CXR. Will try cardizem and likely will need cardizem drip for rapid afib.   11:53 AM Labs showed Cr 1.7, worse than previous. CXR showed some pulmonary edema. BNP 1500. Given lasix and briefly dropped BP to the 80s. HR still 120s after cardizem bolus. Started cardizem drip. INR 4 but denies melena or coughing up blood or bleeding anywhere. Consulted cardiology to admit.   2:48 PM Dr. Antoine Poche from cardiology saw patient. BP still in the 90s, HR 110s on cardizem drip. Given lasix already. She has mild AKI. They recommend admission to medicine service and they will consult. Will admit to stepdown.   Final Clinical Impressions(s) / ED Diagnoses   Final diagnoses:  None    New Prescriptions New Prescriptions   No medications on file     Charlynne Pander, MD 02/22/17 [phone]

## 2017-02-22 NOTE — ED Notes (Signed)
Admitting MD at bedside.

## 2017-02-22 NOTE — ED Notes (Signed)
Cards at bedside

## 2017-02-22 NOTE — ED Notes (Signed)
Patient provided with crackers and water, sitting up in bed, no signs of distress noted.

## 2017-02-23 ENCOUNTER — Inpatient Hospital Stay (HOSPITAL_COMMUNITY): Payer: Medicaid Other

## 2017-02-23 ENCOUNTER — Other Ambulatory Visit (HOSPITAL_COMMUNITY): Payer: Self-pay

## 2017-02-23 ENCOUNTER — Ambulatory Visit: Payer: Self-pay | Admitting: Physician Assistant

## 2017-02-23 DIAGNOSIS — I340 Nonrheumatic mitral (valve) insufficiency: Secondary | ICD-10-CM

## 2017-02-23 DIAGNOSIS — R011 Cardiac murmur, unspecified: Secondary | ICD-10-CM

## 2017-02-23 LAB — COMPREHENSIVE METABOLIC PANEL
ALT: 78 U/L — ABNORMAL HIGH (ref 14–54)
AST: 85 U/L — AB (ref 15–41)
Albumin: 3.2 g/dL — ABNORMAL LOW (ref 3.5–5.0)
Alkaline Phosphatase: 89 U/L (ref 38–126)
Anion gap: 10 (ref 5–15)
BILIRUBIN TOTAL: 2.4 mg/dL — AB (ref 0.3–1.2)
BUN: 32 mg/dL — AB (ref 6–20)
CO2: 26 mmol/L (ref 22–32)
Calcium: 8.8 mg/dL — ABNORMAL LOW (ref 8.9–10.3)
Chloride: 100 mmol/L — ABNORMAL LOW (ref 101–111)
Creatinine, Ser: 1.39 mg/dL — ABNORMAL HIGH (ref 0.44–1.00)
GFR, EST AFRICAN AMERICAN: 45 mL/min — AB (ref >60)
GFR, EST NON AFRICAN AMERICAN: 39 mL/min — AB (ref >60)
Glucose, Bld: 138 mg/dL — ABNORMAL HIGH (ref 65–99)
POTASSIUM: 3.4 mmol/L — AB (ref 3.5–5.1)
Sodium: 136 mmol/L (ref 135–145)
TOTAL PROTEIN: 6.4 g/dL — AB (ref 6.5–8.1)

## 2017-02-23 LAB — TSH: TSH: 1.447 u[IU]/mL (ref 0.350–4.500)

## 2017-02-23 LAB — CBC
HCT: 29.8 % — ABNORMAL LOW (ref 36.0–46.0)
Hemoglobin: 8.9 g/dL — ABNORMAL LOW (ref 12.0–15.0)
MCH: 22.4 pg — ABNORMAL LOW (ref 26.0–34.0)
MCHC: 29.9 g/dL — ABNORMAL LOW (ref 30.0–36.0)
MCV: 74.9 fL — ABNORMAL LOW (ref 78.0–100.0)
PLATELETS: 201 10*3/uL (ref 150–400)
RBC: 3.98 MIL/uL (ref 3.87–5.11)
RDW: 15.3 % (ref 11.5–15.5)
WBC: 6.5 10*3/uL (ref 4.0–10.5)

## 2017-02-23 LAB — ECHOCARDIOGRAM COMPLETE
HEIGHTINCHES: 67 in
WEIGHTICAEL: 4239.89 [oz_av]

## 2017-02-23 LAB — HIV ANTIBODY (ROUTINE TESTING W REFLEX): HIV SCREEN 4TH GENERATION: NONREACTIVE

## 2017-02-23 LAB — MRSA PCR SCREENING: MRSA BY PCR: POSITIVE — AB

## 2017-02-23 MED ORDER — RIVAROXABAN 20 MG PO TABS
20.0000 mg | ORAL_TABLET | Freq: Every day | ORAL | Status: DC
  Administered 2017-02-23 – 2017-03-08 (×14): 20 mg via ORAL
  Filled 2017-02-23 (×14): qty 1

## 2017-02-23 MED ORDER — HYDROCERIN EX CREA
TOPICAL_CREAM | Freq: Every day | CUTANEOUS | Status: AC
  Administered 2017-02-23 – 2017-03-07 (×11): via TOPICAL
  Filled 2017-02-23: qty 113

## 2017-02-23 MED ORDER — MUPIROCIN 2 % EX OINT
1.0000 "application " | TOPICAL_OINTMENT | Freq: Two times a day (BID) | CUTANEOUS | Status: AC
  Administered 2017-02-23 – 2017-02-27 (×9): 1 via NASAL
  Filled 2017-02-23 (×2): qty 22

## 2017-02-23 MED ORDER — CHLORHEXIDINE GLUCONATE CLOTH 2 % EX PADS
6.0000 | MEDICATED_PAD | Freq: Every day | CUTANEOUS | Status: DC
  Administered 2017-02-24 – 2017-02-27 (×4): 6 via TOPICAL

## 2017-02-23 NOTE — Progress Notes (Signed)
    [facility] has been requested to perform a transesophageal echocardiogram with cardioversion on 09/10 for atrial fib.  After careful review of history and examination, the risks and benefits of transesophageal echocardiogram have been explained including risks of esophageal damage, perforation (1:10,000 risk), bleeding, pharyngeal hematoma as well as other potential complications associated with conscious sedation including aspiration, arrhythmia, respiratory failure and death. Alternatives to treatment were discussed, questions were answered. Patient is willing to proceed.   Leanna BattlesBarrett, Jenai Scaletta, PA-C 02/23/2017 5:17 PM

## 2017-02-23 NOTE — Progress Notes (Deleted)
Cardiology Office Note:    Date:  02/23/2017   ID:  Jennifer, Bauer 03-Sep-1950, MRN [id number]  PCP:  Cyndi Bender, PA-C  Cardiologist:  Marland Kitchen  Referring MD: Cyndi Bender, PA-C   No chief complaint on file. ***  History of Present Illness:    Jennifer Bauer is a 66 y.o. female with a hx of aortic stenosis status post bioprosthetic AVR in 2012. Postoperative course was complicated by atrial fibrillation which was controlled with amiodarone.  Cardiac Catheterization prior to her AVR demonstrated normal coronary arteries. She has had recurrent AF with RVR and assoc HFpEF.  She was admitted to [facility] in 2017.  She is on anticoagulation with Rivaroxaban. She underwent DCCV in April 2018.    [name] ***  Prior CV studies:   The following studies were reviewed today:  Echocardiogram 03/26/16 Mild conc LVH, EF 55-60, markedly dilated LA, AVR with max pressure gradient 42, mean 27, MAC, mild MR, mild to mod TR, mild pulmonary HTN, RVSP 55, small posterior pericardial effusion  Echocardiogram 05/2011 Mild LVH, EF 55-65, normal wall motion, MAC, moderate LAE, trivial effusion, AVR okay (mean 19, peak 37)  Cardiac Catheterization 03/2011 Normal LVF with EF 55%, normal cors and mod AI, severe AS with mean gradient 42 mmHg.  Past Medical History:  Diagnosis Date  . Acute on chronic diastolic CHF (congestive heart failure), NYHA class 4 ([place]) 02/22/2017  . Aortic insufficiency and aortic stenosis    s/p Tissue AVR with Dr. Roxy Manns 03/2011; preop LHC with normal cors;  pre op carotid dopplers neg for ICA stenosis  . Arthritis   . Atrial fibrillation ([place])    post op after AVR 10/12  . HTN (hypertension)   . Mitral regurgitation   . Multiple blisters both lower legs 02/22/2017  . SVT (supraventricular tachycardia) (HCC)     Past Surgical History:  Procedure Laterality Date  . AORTIC VALVE REPLACEMENT  03[facility]30/2011   [facility]  .  CARDIOVERSION N/A 08/24/2016   Procedure: CARDIOVERSION;  Surgeon: PFay Records MD;  [place] [facility]  Service: Cardiovascular;  Laterality: N/A;  . hysterctomy    . TONSILLECTOMY      Current Medications: No outpatient prescriptions have been marked as taking for the 02/23/17 encounter (Appointment) with WRichardson DoppT, PA-C.     Allergies:   Acetaminophen   Social History   Social History  . Marital status: Single    Spouse name: N/A  . Number of children: 0  . Years of education: N/A   Occupational History  . Retired    Social History Main Topics  . Smoking status: Never Smoker  . Smokeless tobacco: Never Used  . Alcohol use No  . Drug use: No  . Sexual activity: Not on file   Other Topics Concern  . Not on file   Social History Narrative   [facility] [place]     Family Hx: The patient's family history includes Colon cancer in her sister; Diabetes in her father and sister; Heart attack in her father; Stroke in her father.  ROS:   Please see the history of present illness.    ROS All other systems reviewed and are negative.   EKGs/Labs/Other Test Reviewed:    EKG:  EKG is *** ordered today.  The ekg ordered today demonstrates ***  Recent Labs: 02/22/2017: ALT 57; B Natriuretic  Peptide 1,588.4; BUN 36; Creatinine, Ser 1.73; Hemoglobin 9.2; Platelets 210; Potassium 5.0; Sodium 134   Recent Lipid Panel Lab Results  Component Value Date/Time   CHOL 172 09/06/2011 12:13 PM   TRIG 169.0 (H) 09/06/2011 12:13 PM   HDL 51.70 09/06/2011 12:13 PM   CHOLHDL 3 09/06/2011 12:13 PM   LDLCALC 87 09/06/2011 12:13 PM    Physical Exam:    VS:  There were no vitals taken for this visit.    Wt Readings from Last 3 Encounters:  02/22/17 264 lb 15.9 oz (120.2 kg)  10/27/16 238 lb (108 kg)  09/29/16 245 lb 12.8 oz (111.5 kg)     ***Physical Exam  ASSESSMENT:    No diagnosis  found. PLAN:    In order of problems listed above:  No diagnosis found.***  Dispo:  No Follow-up on file.   Medication Adjustments/Labs and Tests Ordered: Current medicines are reviewed at length with the patient today.  Concerns regarding medicines are outlined above.  Tests Ordered: No orders of the defined types were placed in this encounter.  Medication Changes: No orders of the defined types were placed in this encounter.   Signed, Richardson Dopp, PA-C  02/23/2017 8:23 AM    [facility] [address], [place], [place]  [address] Phone: [phone]; Fax: [phone]

## 2017-02-23 NOTE — Plan of Care (Signed)
Problem: Safety: Goal: Ability to remain free from injury will improve Outcome: Progressing On low bed with floor mats due to high fall risk and bleeding with oral anticoagulants  Problem: Physical Regulation: Goal: Will remain free from infection Outcome: Not Progressing Positive for MRSA Nares

## 2017-02-23 NOTE — Progress Notes (Signed)
  Echocardiogram 2D Echocardiogram has been performed.  Jennifer Bauer 02/23/2017, 11:12 AM

## 2017-02-23 NOTE — Progress Notes (Signed)
Patient complaining of intermittent and exersize induced chest discomfort,  Encouraged to stay in bed, purewick provided on admission for urination, [facility]. Resting at intervals. C/o various pain, stomach, chest, legs that is chronic.

## 2017-02-23 NOTE — Progress Notes (Signed)
Pharmacy Note Re: Xarelto  Xarelto 15 mg dose was ordered last night while patient was in the ER, but dose was never charted.  Unclear if it was given or not.  Spoke to Dr. Antoine PocheHochrein, planning to cancel DCCV for today.  Tad MooreJessica Tanisa Lagace, Pharm D, BCPS  Clinical Pharmacist Pager [phone]  02/23/2017 11:13 AM

## 2017-02-23 NOTE — Progress Notes (Signed)
Progress Note  Patient Name: Jennifer Bauer Date of Encounter: 02/23/2017  Primary Cardiologist:  Dr. Elease Hashimoto  Subjective   She is complaining of back pain and nausea.  She was only able to eat crackles last night.   She has dyspnea with exertion.   Inpatient Medications    Scheduled Meds: . Chlorhexidine Gluconate Cloth  6 each Topical Q0600  . citalopram  20 mg Oral Daily  . furosemide  80 mg Oral BID  . metoprolol tartrate  50 mg Oral BID  . mupirocin ointment  1 application Nasal BID  . pantoprazole  40 mg Oral Daily  . pneumococcal 23 valent vaccine  0.5 mL Intramuscular Tomorrow-1000  . rivaroxaban  15 mg Oral Q supper  . traMADol  100 mg Oral BID   Continuous Infusions: . diltiazem (CARDIZEM) infusion 12.5 mg/hr (02/22/17 2301)   PRN Meds: ondansetron **OR** ondansetron (ZOFRAN) IV, polyethylene glycol   Vital Signs    Vitals:   02/23/17 0000 02/23/17 0309 02/23/17 0400 02/23/17 0600  BP:  (!) 105/35    Pulse: (!) 119 (!) 104 99 67  Resp: 20 20  (!) 36  Temp:  98.4 F (36.9 C)    TempSrc:  Oral    SpO2: 93% 93%  90%  Weight:      Height:        Intake/Output Summary (Last 24 hours) at 02/23/17 0755 Last data filed at 02/22/17 2102  Gross per 24 hour  Intake                0 ml  Output              780 ml  Net             -780 ml   Filed Weights   02/22/17 2102  Weight: 264 lb 15.9 oz (120.2 kg)    Telemetry    Atrial fib with rapid rate - Personally Reviewed  ECG    NA - Personally Reviewed  Physical Exam   GEN:  She is uncomfortable with her back Neck: No  JVD Cardiac: Irregular RR, no murmurs, rubs, or gallops.  Respiratory: Clear  to auscultation bilaterally. GI: Soft, nontender, non-distended  MS: Severe edema; No deformity. Neuro:  Nonfocal  Psych: Normal affect   Labs    Chemistry Recent Labs Lab 02/22/17 1030  NA 134*  K 5.0  CL 101  CO2 21*  GLUCOSE 141*  BUN 36*  CREATININE 1.73*  CALCIUM 8.8*  PROT 6.7    ALBUMIN 3.3*  AST 92*  ALT 57*  ALKPHOS 97  BILITOT 2.1*  GFRNONAA 30*  GFRAA 35*  ANIONGAP 12     Hematology Recent Labs Lab 02/22/17 1030  WBC 8.2  RBC 4.03  HGB 9.2*  HCT 30.0*  MCV 74.4*  MCH 22.8*  MCHC 30.7  RDW 15.6*  PLT 210    Cardiac EnzymesNo results for input(s): TROPONINI in the last 168 hours.  Recent Labs Lab 02/22/17 1047  TROPIPOC 0.03     BNP Recent Labs Lab 02/22/17 1030  BNP 1,588.4*     DDimer No results for input(s): DDIMER in the last 168 hours.   Radiology    Dg Chest Port 1 View  Result Date: 02/22/2017 CLINICAL DATA:  Chest pain and shortness of breath over the last few days. Weakness. EXAM: PORTABLE CHEST 1 VIEW COMPARISON:  06/20/2016 FINDINGS: Previous median sternotomy and aortic valve replacement. Mild cardiomegaly. Aortic atherosclerosis. The lungs  are clear. No edema. Chronic calcified granuloma on the left as seen previously. No significant bone finding. No effusions. IMPRESSION: No active disease by radiography. Chronic cardiomegaly and aortic atherosclerosis. No pulmonary edema or pleural effusions. Electronically Signed   By: Paulina FusiMark  Shogry M.D.   On: 02/22/2017 10:55    Cardiac Studies   Echo:  Pending.   Patient Profile     66 y.o. female with a hx of HTN, AoV dz (s/p AVR 2012), atrial fib (s/p DCCV), bradycardia on higher doses of CCB, LE cellulitis, and hypokalemia who is being seen for the evaluation of CHF and atrial fib at the request of Dr Silverio LayYao. She has been given amio in the past, which helped her convert to SR.  Assessment & Plan    ACUTE ON CHRONIC DIASTOLIC HF:   I think her dyspnea is multifactorial.  She does have an elevated BNP but no edema on CXR and exam would not suggest that she has significant pulmonary edema.  Echo is pending.  I would like to see this before cardioversion which I would plan for later today.  I do think that being in fib with rapid rate exacerbates her diastolic dysfunction.  We will  try to maintain NSR.   ATRIAL FIB:   She has been on therapeutic Xarelto.  I will try to schedule her for cardioversion later today.   AVR:   Echo pending.  CKD:   BMET is pending this morning.  NAUSEA:  Per primary team.  ANEMIA:  No CBC this morning.  She denies active bleeding.  Plan per primary team.    Signed, Rollene RotundaJames Benjamin Casanas, MD  02/23/2017, 7:55 AM

## 2017-02-23 NOTE — Plan of Care (Signed)
Problem: Activity: Goal: Risk for activity intolerance will decrease Outcome: Progressing Patient went to the bedside commode.

## 2017-02-23 NOTE — Progress Notes (Signed)
   Subjective: patient still somewhat short of breath. She still notices her heart is beating faster than normal.she complains of nausea and some back pain.  Objective:  Vital signs in last 24 hours: Vitals:   02/23/17 0400 02/23/17 0600 02/23/17 0830 02/23/17 1002  BP:   92/70 123/75  Pulse: 99 67 79   Resp:  (!) 36 (!) 21   Temp:   98.7 F (37.1 C)   TempSrc:   Oral   SpO2:  90% 96%   Weight:      Height:       I/O last 3 completed shifts: In: -  Out: 780 [Urine:780] No intake/output data recorded.  Patient now had a normal rate on diltiazem 12/h  Physical Exam  Constitutional: She appears well-developed and well-nourished.  Neck: JVD present.  Cardiovascular: Normal rate.  An irregularly irregular rhythm present.  Murmur heard.  Systolic murmur is present with a grade of 3/6  Pulmonary/Chest: No respiratory distress. She has no wheezes. She has no rales.  Abdominal: Soft. There is tenderness (epigastric). There is no rebound.  Musculoskeletal: She exhibits edema (2+ LE).  Neurological: She is alert.    Assessment/Plan:  Principal Problem:   Acute on chronic diastolic CHF (congestive heart failure), NYHA class 4 (HCC) Active Problems:   S/P AVR (aortic valve replacement)   Atrial fibrillation with rapid ventricular response (HCC)   Physical deconditioning   Multiple blisters both lower legs   Chronic anticoagulation   Atrial fibrillation with RVR (HCC)  1- DOE, LE edema: in setting of CHF exacerbation. She is on home dose Furosemide of 80 mg BID.  Pro-BNP:1588, Last Pro-BNP (2017) :3070  Last Echo (06/2016): Diastolic dysfunction - grade II. Normal left ventricular systolic function, EF: 55 to 60%. Current BP is Cardiology consulted and placed the order for  -Continue home does of Furosemide: 80 mg po BID  -Echo pending -Patient's O2 saturation is satisfactory on room air. Does not appear to have significant pulmonary edema  02/23/17 -patient reports shortness  of breath unchanged this morning. -no weight check today, input output log in complete. -Echo is still pending. -patient may require additional diuresis after cardioversion.  2- Afib: Symptomatic, Currently afib on ECG, Has been on Xarelto. Hx of cardioversion (08/2016). Cardiology consulted and placed orders as bellow:  -Continue Xarelto  -Diltiazem titrated to 12/h  02/23/17 -Pt now at a normal rate and her blood pressure is tolerating this well last BP was 123/75 -cardiology came by and saw patient this morning. They wish to wait for the echo results this morning before proceeding with cardioversion which is scheduled for later this afternoon.   3- Microcytic anemia. Hb =9.2, MCV=74. Her baseline between Hb has been around 9-11 recently. Seems to be chronic.  -Monitor CBC daily  -Check Ferritin  -Follow up in clinic    4- AKI: Mild. Cr=1.72, BUN/Cr ratio:20.8 Seems to have pre-renal etiology  Lab Results  Component Value Date   CREATININE 1.39 (H) 02/23/2017   CREATININE 1.73 (H) 02/22/2017   CREATININE 1.05 (H) 08/17/2016   -creatinine lower this morning see above   5- Transaminitis: AST:92, ALT:57 . Previously normal. Most likely due to hypoperfusion/hypoxia in setting of recent CHF exacerbation and Afib    Dispo: Anticipated discharge in approximately 3 day(s).   Jennifer Bauer, Nestor Wieneke B, MD 02/23/2017, 10:36 AM

## 2017-02-23 NOTE — Consult Note (Signed)
WOC Nurse wound consult note Reason for Consult: BLE swelling with open sores, partial thickness Wound type: Venous insufficiency Pressure Injury POA: NA Measurement: both pretibial and calf areas have multiple 1cm round open wounds with small amount of redness surrounding each, scant yellow drainage. Both legs are very edematous and have scaly dry peeling skin.  Wound bed:see above Drainage (amount, consistency, odor) see above Periwound: see above Dressing procedure/placement/frequency: I have provided nurses with orders for Eucerin cream, followed by Xeroform, wrap with kerlix, wrap with ace wraps in a spiral fashion from the toes to just below the knee, change daily. Educated pt on disease process and need for her to keep legs elevated. Pt states she has never been told to keep her edematous legs elevated. We will not follow, but will remain available to this patient, to nursing, and the medical and/or surgical teams.  Please re-consult if we need to assist further.   Barnett HatterMelinda Astoria Condon, RN-C, WTA-C Wound Treatment Associate

## 2017-02-23 NOTE — Progress Notes (Signed)
CRITICAL VALUE ALERT  Critical Value:  Pos MRSA  Date & Time Notied: 02/23/2017 0250  Provider Notified: 02/23/2017 0253  Orders Received/Actions taken:

## 2017-02-24 LAB — BASIC METABOLIC PANEL
ANION GAP: 8 (ref 5–15)
BUN: 24 mg/dL — ABNORMAL HIGH (ref 6–20)
CHLORIDE: 98 mmol/L — AB (ref 101–111)
CO2: 30 mmol/L (ref 22–32)
CREATININE: 1.15 mg/dL — AB (ref 0.44–1.00)
Calcium: 8.6 mg/dL — ABNORMAL LOW (ref 8.9–10.3)
GFR calc non Af Amer: 49 mL/min — ABNORMAL LOW (ref >60)
GFR, EST AFRICAN AMERICAN: 57 mL/min — AB (ref >60)
Glucose, Bld: 114 mg/dL — ABNORMAL HIGH (ref 65–99)
Potassium: 3.3 mmol/L — ABNORMAL LOW (ref 3.5–5.1)
SODIUM: 136 mmol/L (ref 135–145)

## 2017-02-24 MED ORDER — POTASSIUM CHLORIDE CRYS ER 20 MEQ PO TBCR
20.0000 meq | EXTENDED_RELEASE_TABLET | Freq: Two times a day (BID) | ORAL | Status: DC
  Administered 2017-02-24: 20 meq via ORAL
  Filled 2017-02-24: qty 1

## 2017-02-24 MED ORDER — POTASSIUM CHLORIDE CRYS ER 20 MEQ PO TBCR
40.0000 meq | EXTENDED_RELEASE_TABLET | Freq: Two times a day (BID) | ORAL | Status: DC
  Administered 2017-02-24 – 2017-03-01 (×11): 40 meq via ORAL
  Filled 2017-02-24 (×11): qty 2

## 2017-02-24 NOTE — Progress Notes (Signed)
Medicine attending: I examined this patient today and reviewed pertinent clinical laboratory and EKG data. 66 year old woman with paroxysmal atrial fibrillation status post cardioversion in March 2018, aortic stenosis status post bioprosthetic heart valve replacement in 2012, who presented on the day of admission September 6 with increasing dyspnea with minimal exertion, palpitations, and nonradiating mid chest pain.  Associated PND and orthopnea.  She was found to be back in atrial fibrillation with a rapid ventricular response.  Rate currently controlled with diltiazem. She has had a poor response to diuretics.  She has persistent 2-3+ peripheral edema.  Lungs today with bibasilar rales.  Distant cardiac sounds.  Irregularly irregular rhythm.  No JVD. Further improvement in renal function with creatinine down from 1.7 to 1.2. Negative fluid balance 1.5 L.  Weight 269 pounds compared with 265 pounds on admission Potassium 3.3. Cardiology following closely.  Plan is for transesophageal echocardiogram on Monday and then consideration of DC cardioversion again.  She continues on anticoagulation with Xarelto.

## 2017-02-24 NOTE — Progress Notes (Addendum)
Subjective:  Complains of mild shortness of breath and also that her heart beats fast when she gets up and moves around.  TEE was not done yesterday.  She says cardioversion will be done Monday.  Objective:  Vital Signs in the last 24 hours: BP 131/85 (BP Location: Right Arm)   Pulse 63   Temp (!) 97.2 F (36.2 C) (Oral)   Resp 19   Ht 5\' 7"  (1.702 m)   Wt 122 kg (269 lb)   SpO2 90%   BMI 42.13 kg/m   Physical Exam: Obese female lying in bed complaining of irregular heart rate Lungs:  Clear Cardiac: Irregular rhythm, normal S1 and S2, no S3, 1 to 2/6 systolic murmur across the aortic valve Abdomen:  Soft, nontender, no masses Extremities:  Lower extremities are wrapped with 1-2+ edema.  Intake/Output from previous day: 09/07 0701 - 09/08 0700 In: 377.5 [P.O.:240; I.V.:137.5] Out: 1300 [Urine:1300]  Weight Filed Weights   02/22/17 2102 02/24/17 0600  Weight: 120.2 kg (264 lb 15.9 oz) 122 kg (269 lb)    Lab Results: Basic Metabolic Panel:  Recent Labs  16/03/9608/07/18 0736 02/24/17 0201  NA 136 136  K 3.4* 3.3*  CL 100* 98*  CO2 26 30  GLUCOSE 138* 114*  BUN 32* 24*  CREATININE 1.39* 1.15*   CBC:  Recent Labs  02/22/17 1030 02/23/17 0736  WBC 8.2 6.5  NEUTROABS 6.1  --   HGB 9.2* 8.9*  HCT 30.0* 29.8*  MCV 74.4* 74.9*  PLT 210 201   Cardiac Enzymes: Troponin (Point of Care Test)  Recent Labs  02/22/17 1047  TROPIPOC 0.03   Telemetry: Atrial fibrillation with controlled response  Assessment/Plan:  1.  Paroxysmal atrial fibrillation which is persistent 2.  Acute on chronic diastolic heart failure 3.  Obesity 4.  Prosthetic aortic valve 5.  Long-term use of anticoagulation 6.  Hypokalemia needs repletion.  Recommendations:  Evidently we'll plan cardioversion on Monday.  Not sure about the reasons for TEE from reading the chart since she has been on anticoagulation       W. Ashley RoyaltySpencer Dann Galicia, Jr.  MD [facility]  02/24/2017, 11:16  AM

## 2017-02-24 NOTE — Progress Notes (Signed)
   Subjective: patient was eating her breakfast when seen this morning. Stating that she is feeling little better, still become short of breath with moving around. She denies any chest pain.  Objective:  Vital signs in last 24 hours: Vitals:   02/24/17 0600 02/24/17 0800 02/24/17 1000 02/24/17 1142  BP:  131/85 109/86 99/84  Pulse:  63  66  Resp:  19  (!) 22  Temp:  (!) 97.2 F (36.2 C)  98 F (36.7 C)  TempSrc:  Oral  Oral  SpO2:  90%  100%  Weight: 269 lb (122 kg)     Height:       Gen.well-developed, well-nourished lady, becomes short of breath while talking, in no acute distress. Chest. Bilateral basal crackles. CV. Irregularly irregular tachycardia Abdomen.soft, obese, nontender, bowel sounds positive. Extremities.3+ pitting edema up to thighs bilaterally.  Assessment/Plan:     Acute on chronic diastolic CHF (congestive heart failure), NYHA class 4 (HCC). Echo done yesterday was negative for any significant changes. She has significant lower extremity edema, and bilateral basal crackles. She will need more aggressive diuresis, we will increase her diuretic regimen once she was cardioverted. -Continue Lasix 80 mg twice a day.    Atrial fibrillation with rapid ventricular response (HCC). Continue to remain in A. Fib with heart rate in the low 100s,she remained on Cardizem infusion at 12.5 mg per hour. -cardiology wants to cardiovert her on Monday after TEE. -Continue Xarelto.    Microcytic anemia.Hb =9.2, MCV=74. Her baseline between Hb has been around 9-11 recently. Seems to be chronic.  -Monitor CBC daily  -Check Ferritin  -Follow up in clinic   AKI: improving with creatinine of 1.15 today. -continue daily BMP.  Transaminitis: AST:92, ALT:57 . Previously normal. Most likely due to hypoperfusion/hypoxia in setting of recent CHF exacerbation and Afib. -We will check hepatic function again tomorrow.  Dispo: Anticipated discharge in approximately 3-4 day(s).   Arnetha CourserAmin,  Keyden Pavlov, MD 02/24/2017, 2:26 PM Pager: [phone]

## 2017-02-25 LAB — BASIC METABOLIC PANEL
ANION GAP: 9 (ref 5–15)
BUN: 16 mg/dL (ref 6–20)
CALCIUM: 8.2 mg/dL — AB (ref 8.9–10.3)
CO2: 28 mmol/L (ref 22–32)
CREATININE: 0.96 mg/dL (ref 0.44–1.00)
Chloride: 99 mmol/L — ABNORMAL LOW (ref 101–111)
GFR calc non Af Amer: >60 mL/min (ref >60)
Glucose, Bld: 114 mg/dL — ABNORMAL HIGH (ref 65–99)
Potassium: 3.1 mmol/L — ABNORMAL LOW (ref 3.5–5.1)
SODIUM: 136 mmol/L (ref 135–145)

## 2017-02-25 LAB — CBC
HCT: 29.7 % — ABNORMAL LOW (ref 36.0–46.0)
Hemoglobin: 9.1 g/dL — ABNORMAL LOW (ref 12.0–15.0)
MCH: 23.1 pg — ABNORMAL LOW (ref 26.0–34.0)
MCHC: 30.6 g/dL (ref 30.0–36.0)
MCV: 75.4 fL — ABNORMAL LOW (ref 78.0–100.0)
PLATELETS: 180 10*3/uL (ref 150–400)
RBC: 3.94 MIL/uL (ref 3.87–5.11)
RDW: 15.8 % — AB (ref 11.5–15.5)
WBC: 7 10*3/uL (ref 4.0–10.5)

## 2017-02-25 LAB — HEPATIC FUNCTION PANEL
ALK PHOS: 88 U/L (ref 38–126)
ALT: 91 U/L — ABNORMAL HIGH (ref 14–54)
AST: 69 U/L — ABNORMAL HIGH (ref 15–41)
Albumin: 3.1 g/dL — ABNORMAL LOW (ref 3.5–5.0)
BILIRUBIN DIRECT: 0.5 mg/dL (ref 0.1–0.5)
BILIRUBIN INDIRECT: 1.2 mg/dL — AB (ref 0.3–0.9)
Total Bilirubin: 1.7 mg/dL — ABNORMAL HIGH (ref 0.3–1.2)
Total Protein: 6.3 g/dL — ABNORMAL LOW (ref 6.5–8.1)

## 2017-02-25 LAB — FERRITIN: FERRITIN: 7 ng/mL — AB (ref 11–307)

## 2017-02-25 MED ORDER — POTASSIUM CHLORIDE CRYS ER 20 MEQ PO TBCR
20.0000 meq | EXTENDED_RELEASE_TABLET | Freq: Once | ORAL | Status: DC
  Filled 2017-02-25: qty 1

## 2017-02-25 MED ORDER — SODIUM CHLORIDE 0.9 % IV SOLN: INTRAVENOUS | Status: DC

## 2017-02-25 NOTE — Progress Notes (Addendum)
   Subjective: patient was feeling little better. Still having A. Fib. With RVR, there was mild improvement in her dyspnea.  Objective:  Vital signs in last 24 hours: Vitals:   02/24/17 2300 02/25/17 0400 02/25/17 0404 02/25/17 0757  BP: 110/86 103/74  114/72  Pulse: 85 (!) 157  (!) 127  Resp: 19 (!) 23  18  Temp: (!) 97.5 F (36.4 C) 98.2 F (36.8 C)  97.9 F (36.6 C)  TempSrc: Oral Oral  Oral  SpO2: 97% 97%  98%  Weight:   267 lb 13.7 oz (121.5 kg)   Height:       Gen.well-developed, well-nourished lady, sitting in her chair, in no acute distress. Chest. Clear bilaterally. CV. Irregularly irregular tachycardia Abdomen.soft, obese, nontender, bowel sounds positive. Extremities.3+ pitting edema up to thighs bilaterally.   Assessment/Plan:  Acute on chronic diastolic CHF (congestive heart failure), NYHA class 4 (HCC). She needs more diuresis, waiting for cardioversion to escalate her diabetes is afterwards. -Continue Lasix 80 mg twice a day.  Atrial fibrillation with rapid ventricular response (HCC). Continue to remain in A. Fib with RVR. Currently on Cardizem drip at 10 mg per hour. -Waiting for TEE tomorrow followed by cardioversion. -Continue Xarelto.  Microcytic anemia.Hb =9.2, MCV=74.ferritin of 7. She will get benefit with iron supplement. Can be placed on iron supplement on discharge.  AKI: Resolved.  Transaminitis: Continue to show slow improvement.  Hypokalemia: most likely due to Lasix, her potassium was again today 3.1 despite being replaced by her home dose of 40 mEq twice a day. -We will check magnesium. -Give her an extra dose of potassium. -Continue 40 mg twice daily. -Repeat BMP tomorrow morning.  Dispo: Anticipated discharge in approximately 2-3 day(s).   Arnetha CourserAmin, Danene Montijo, MD 02/25/2017, 11:57 AM Pager: [phone]

## 2017-02-25 NOTE — Progress Notes (Signed)
Subjective:  Still complains of palpitations and very minimal dyspnea with exertion.  Objective:  Vital Signs in the last 24 hours: BP 114/72 (BP Location: Right Arm)   Pulse (!) 127   Temp 97.9 F (36.6 C) (Oral)   Resp 18   Ht 5\' 7"  (1.702 m)   Wt 121.5 kg (267 lb 13.7 oz)   SpO2 98%   BMI 41.95 kg/m   Physical Exam: Obese female sitting in chair in no acute distress Lungs:  Clear Cardiac: Irregular rhythm, normal S1 and S2, no S3, 1 to 2/6 systolic murmur across the aortic valve Abdomen:  Soft, nontender, no masses Extremities:  Lower extremities are wrapped with 1-2+ edema.  Intake/Output from previous day: 09/08 0701 - 09/09 0700 In: 653.8 [P.O.:240; I.V.:413.8] Out: 1625 [Urine:1625]  Weight Filed Weights   02/22/17 2102 02/24/17 0600 02/25/17 0404  Weight: 120.2 kg (264 lb 15.9 oz) 122 kg (269 lb) 121.5 kg (267 lb 13.7 oz)    Lab Results: Basic Metabolic Panel:  Recent Labs  69/62/9508/02/04 0201 02/25/17 0331  NA 136 136  K 3.3* 3.1*  CL 98* 99*  CO2 30 28  GLUCOSE 114* 114*  BUN 24* 16  CREATININE 1.15* 0.96   CBC:  Recent Labs  02/23/17 0736 02/25/17 0331  WBC 6.5 7.0  HGB 8.9* 9.1*  HCT 29.8* 29.7*  MCV 74.9* 75.4*  PLT 201 180   Telemetry: Atrial fibrillation with controlled response, Rapid at times  Assessment/Plan:  1.  Paroxysmal atrial fibrillation which is persistent 2.  Acute on chronic diastolic heart failure 3.  Obesity 4.  Prosthetic aortic valve 5.  Long-term use of anticoagulation 6.  Hypokalemia needs repletion.  Recommendations:  Keep nothing by mouth for cardioversion tomorrow.  She has been on anticoagulation so not totally sure about the need for TEE but will defer to primary team..

## 2017-02-25 NOTE — Progress Notes (Signed)
Medicine attending: I examined this patient today together with resident physician Dr. Arnetha CourserSumayya Amin and I concur with her evaluation and management plan which we discussed together. No acute change.  She remains in atrial fibrillation.  Variable rate as high as 127/min on parenteral Cardizem infusion.  Weight approximately the same at 268 pounds.  She is diuresing about 1 L daily.  Negative fluid balance now 2.2 L. Minimal to no rales on lung exam today.  No JVD.  Irregularly irregular cardiac rhythm.  Distant sounds.  Persistent 2-3+ edema to the knees. Plan to proceed with DC cardioversion tomorrow.  Continue anticoagulation.  Still behind on her potassium.

## 2017-02-25 NOTE — NC FL2 (Signed)
[place] MEDICAID FL2 LEVEL OF CARE SCREENING TOOL     IDENTIFICATION  Patient Name: Jennifer Bauer Birthdate: [birth year] Sex: female Admission Date (Current Location): 02/22/2017  [facility] [place]   Facility and Address:  [facility] [place], [address], [place], [place] [address]      Provider Number: [id number]  Attending Physician Name and Address:  Gust RungHoffman, Erik C, DO  Relative Name and Phone Number:       Current Level of Care: Hospital Recommended Level of Care: Skilled Nursing Facility Prior Approval Number:    Date Approved/Denied:   PASRR Number: [id number]  Discharge Plan: SNF    Current Diagnoses: Patient Active Problem List   Diagnosis Date Noted  . Acute on chronic diastolic CHF (congestive heart failure), NYHA class 4 (HCC) 02/22/2017  . Multiple blisters both lower legs 02/22/2017  . Chronic anticoagulation 02/22/2017  . Atrial fibrillation with RVR (HCC) 02/22/2017  . Chronic diastolic congestive heart failure (HCC) 10/27/2016  . Physical deconditioning 06/28/2016  . Atrial fibrillation with rapid ventricular response (HCC) 06/19/2016  . PAF (paroxysmal atrial fibrillation) (HCC) 04/18/2011  . Mitral regurgitation   . HTN (hypertension)   . Tachycardia   . Arthritis   . S/P AVR (aortic valve replacement) 03/30/2011    Orientation RESPIRATION BLADDER Height & Weight     Self, Time, Situation, Place  O2 ([place] 3L) Continent Weight: 267 lb 13.7 oz (121.5 kg) Height:  5\' 7"  (170.2 cm)  BEHAVIORAL SYMPTOMS/MOOD NEUROLOGICAL BOWEL NUTRITION STATUS      Continent Diet (cardiac)  AMBULATORY STATUS COMMUNICATION OF NEEDS Skin   Extensive Assist Verbally Skin abrasions                       Personal Care Assistance Level of Assistance  Bathing, Dressing Bathing Assistance: Limited assistance   Dressing Assistance: Limited assistance     Functional Limitations Info             SPECIAL CARE  FACTORS FREQUENCY  PT (By licensed PT), OT (By licensed OT)     PT Frequency: not assessed at this time OT Frequency: not assessed at this time            Contractures      Additional Factors Info  Code Status, Allergies, Psychotropic, Isolation Precautions Code Status Info: Full Allergies Info: Acetaminophen Psychotropic Info: Celexa 20mg    Isolation Precautions Info: Contact precautions     Current Medications (02/25/2017):  This is the current hospital active medication list Current Facility-Administered Medications  Medication Dose Route Frequency Provider Last Rate Last Dose  . Chlorhexidine Gluconate Cloth 2 % PADS 6 each  6 each Topical Q0600 Gust RungHoffman, Erik C, DO   6 each at 02/25/17 [phone]  . citalopram (CELEXA) tablet 20 mg  20 mg Oral Daily Arnetha CourserAmin, Sumayya, MD   20 mg at 02/25/17 1001  . diltiazem (CARDIZEM) 100 mg in dextrose 5 % 100 mL (1 mg/mL) infusion  5-15 mg/hr Intravenous Titrated Scherrie GerlachHuang, Jennifer, MD 10 mL/hr at 02/25/17 0757 10 mg/hr at 02/25/17 0757  . furosemide (LASIX) tablet 80 mg  80 mg Oral BID Barrett, Joline SaltRhonda G, PA-C   80 mg at 02/25/17 [id number]  . hydrocerin (EUCERIN) cream   Topical Daily Gust RungHoffman, Erik C, DO      . metoprolol tartrate (LOPRESSOR) tablet 50 mg  50 mg Oral BID Arnetha CourserAmin, Sumayya, MD   50 mg at 02/25/17 0742  . mupirocin  ointment (BACTROBAN) 2 % 1 application  1 application Nasal BID Gust Rung, DO   1 application at 02/25/17 0744  . ondansetron (ZOFRAN) tablet 4 mg  4 mg Oral Q6H PRN Arnetha Courser, MD       Or  . ondansetron (ZOFRAN) injection 4 mg  4 mg Intravenous Q6H PRN Arnetha Courser, MD   4 mg at 02/25/17 0744  . pantoprazole (PROTONIX) EC tablet 40 mg  40 mg Oral Daily Arnetha Courser, MD   40 mg at 02/25/17 0743  . polyethylene glycol (MIRALAX / GLYCOLAX) packet 17 g  17 g Oral Daily PRN Arnetha Courser, MD      . potassium chloride SA (K-DUR,KLOR-CON) CR tablet 20 mEq  20 mEq Oral Once Arnetha Courser, MD      . potassium chloride SA  (K-DUR,KLOR-CON) CR tablet 40 mEq  40 mEq Oral BID Arnetha Courser, MD   40 mEq at 02/25/17 0743  . rivaroxaban (XARELTO) tablet 20 mg  20 mg Oral Q supper Gardner Candle, RPH   20 mg at 02/24/17 1702  . traMADol (ULTRAM) tablet 100 mg  100 mg Oral BID Arnetha Courser, MD   100 mg at 02/25/17 0741     Discharge Medications: Please see discharge summary for a list of discharge medications.  Relevant Imaging Results:  Relevant Lab Results:   Additional Information SS#: [id number]  Baldemar Lenis, LCSW

## 2017-02-26 ENCOUNTER — Inpatient Hospital Stay (HOSPITAL_COMMUNITY): Payer: Medicaid Other | Admitting: Certified Registered"

## 2017-02-26 ENCOUNTER — Other Ambulatory Visit (HOSPITAL_COMMUNITY): Payer: Self-pay

## 2017-02-26 ENCOUNTER — Encounter (HOSPITAL_COMMUNITY): Payer: Self-pay | Admitting: Certified Registered"

## 2017-02-26 ENCOUNTER — Encounter (HOSPITAL_COMMUNITY)
Admission: EM | Disposition: A | Discharge status: Skilled Nursing Facility | Payer: Self-pay | Source: Home / Self Care | Attending: Internal Medicine

## 2017-02-26 DIAGNOSIS — Z7901 Long term (current) use of anticoagulants: Secondary | ICD-10-CM

## 2017-02-26 DIAGNOSIS — Z952 Presence of prosthetic heart valve: Secondary | ICD-10-CM

## 2017-02-26 DIAGNOSIS — I5043 Acute on chronic combined systolic (congestive) and diastolic (congestive) heart failure: Secondary | ICD-10-CM

## 2017-02-26 HISTORY — PX: CARDIOVERSION: SHX1299

## 2017-02-26 LAB — COMPREHENSIVE METABOLIC PANEL
ALK PHOS: 92 U/L (ref 38–126)
ALT: 73 U/L — AB (ref 14–54)
AST: 42 U/L — AB (ref 15–41)
Albumin: 3 g/dL — ABNORMAL LOW (ref 3.5–5.0)
Anion gap: 7 (ref 5–15)
BILIRUBIN TOTAL: 1.5 mg/dL — AB (ref 0.3–1.2)
BUN: 17 mg/dL (ref 6–20)
CO2: 29 mmol/L (ref 22–32)
CREATININE: 0.93 mg/dL (ref 0.44–1.00)
Calcium: 8.6 mg/dL — ABNORMAL LOW (ref 8.9–10.3)
Chloride: 101 mmol/L (ref 101–111)
Glucose, Bld: 125 mg/dL — ABNORMAL HIGH (ref 65–99)
Potassium: 3.7 mmol/L (ref 3.5–5.1)
Sodium: 137 mmol/L (ref 135–145)
TOTAL PROTEIN: 6.6 g/dL (ref 6.5–8.1)

## 2017-02-26 LAB — CBC
HEMATOCRIT: 30.6 % — AB (ref 36.0–46.0)
Hemoglobin: 8.9 g/dL — ABNORMAL LOW (ref 12.0–15.0)
MCH: 22.2 pg — ABNORMAL LOW (ref 26.0–34.0)
MCHC: 29.1 g/dL — AB (ref 30.0–36.0)
MCV: 76.3 fL — ABNORMAL LOW (ref 78.0–100.0)
PLATELETS: 203 10*3/uL (ref 150–400)
RBC: 4.01 MIL/uL (ref 3.87–5.11)
RDW: 15.5 % (ref 11.5–15.5)
WBC: 6.1 10*3/uL (ref 4.0–10.5)

## 2017-02-26 LAB — MAGNESIUM: MAGNESIUM: 2.2 mg/dL (ref 1.7–2.4)

## 2017-02-26 SURGERY — CARDIOVERSION
Anesthesia: General

## 2017-02-26 MED ORDER — PROPOFOL 10 MG/ML IV BOLUS
INTRAVENOUS | Status: DC | PRN
  Administered 2017-02-26: 60 mg via INTRAVENOUS

## 2017-02-26 MED ORDER — FUROSEMIDE 10 MG/ML IJ SOLN
40.0000 mg | Freq: Two times a day (BID) | INTRAMUSCULAR | Status: DC
  Administered 2017-02-26 – 2017-03-01 (×6): 40 mg via INTRAVENOUS
  Filled 2017-02-26 (×8): qty 4

## 2017-02-26 NOTE — H&P (View-Only) (Signed)
Progress Note  Patient Name: Jennifer Bauer Date of Encounter: 02/26/2017  Primary Cardiologist: Dr Elease Hashimoto  Subjective   She didn't sleep well, she felt SOB. Now in chair.  Inpatient Medications    Scheduled Meds: . Chlorhexidine Gluconate Cloth  6 each Topical Q0600  . citalopram  20 mg Oral Daily  . furosemide  80 mg Oral BID  . hydrocerin   Topical Daily  . metoprolol tartrate  50 mg Oral BID  . mupirocin ointment  1 application Nasal BID  . pantoprazole  40 mg Oral Daily  . potassium chloride  20 mEq Oral Once  . potassium chloride  40 mEq Oral BID  . rivaroxaban  20 mg Oral Q supper  . traMADol  100 mg Oral BID   Continuous Infusions: . sodium chloride    . diltiazem (CARDIZEM) infusion 10 mg/hr (02/26/17 0329)   PRN Meds: ondansetron **OR** ondansetron (ZOFRAN) IV, polyethylene glycol   Vital Signs    Vitals:   02/26/17 0000 02/26/17 0328 02/26/17 0430 02/26/17 0742  BP: (!) 108/95 111/81  120/78  Pulse: (!) 39 84  (!) 112  Resp: (!) 24 17  (!) 26  Temp:  98.2 F (36.8 C)  98.8 F (37.1 C)  TempSrc:  Oral  Oral  SpO2: 97% 95%  94%  Weight:   268 lb 8.3 oz (121.8 kg)   Height:        Intake/Output Summary (Last 24 hours) at 02/26/17 1035 Last data filed at 02/26/17 0500  Gross per 24 hour  Intake           330.51 ml  Output             1050 ml  Net          -719.49 ml   Filed Weights   02/24/17 0600 02/25/17 0404 02/26/17 0430  Weight: 269 lb (122 kg) 267 lb 13.7 oz (121.5 kg) 268 lb 8.3 oz (121.8 kg)    Telemetry    A-fib with RVR 100-120 - Personally Reviewed  Physical Exam   GEN: No acute distress.   Neck: No JVD Cardiac: RRR, no murmurs, rubs, or gallops.  Respiratory: Decreased BS at the bases B/L. GI: Soft, nontender, non-distended  MS: No edema; No deformity. Neuro:  Nonfocal  Psych: Normal affect   Labs    Chemistry Recent Labs Lab 02/23/17 0736 02/24/17 0201 02/25/17 0331 02/26/17 0356  NA 136 136 136 137  K 3.4*  3.3* 3.1* 3.7  CL 100* 98* 99* 101  CO2 GLUCOSE 138* 114* 114* 125*  BUN 32* 24* 16 17  CREATININE 1.39* 1.15* 0.96 0.93  CALCIUM 8.8* 8.6* 8.2* 8.6*  PROT 6.4*  --  6.3* 6.6  ALBUMIN 3.2*  --  3.1* 3.0*  AST 85*  --  69* 42*  ALT 78*  --  91* 73*  ALKPHOS 89  --  88 92  BILITOT 2.4*  --  1.7* 1.5*  GFRNONAA 39* 49* >60 >60  GFRAA 45* 57* >60 >60  ANIONGAP Hematology Recent Labs Lab 02/23/17 0736 02/25/17 0331 02/26/17 0356  WBC 6.5 7.0 6.1  RBC 3.98 3.94 4.01  HGB 8.9* 9.1* 8.9*  HCT 29.8* 29.7* 30.6*  MCV 74.9* 75.4* 76.3*  MCH 22.4* 23.1* 22.2*  MCHC 29.9* 30.6 29.1*  RDW 15.3 15.8* 15.5  PLT 201 180 203    Cardiac EnzymesNo results for input(s): TROPONINI  in the last 168 hours.  Recent Labs Lab 02/22/17 1047  TROPIPOC 0.03     BNP Recent Labs Lab 02/22/17 1030  BNP 1,588.4*     DDimer No results for input(s): DDIMER in the last 168 hours.   Radiology    No results found.  Cardiac Studies   TTE: 02/23/17 - Left ventricle: The cavity size was normal. Wall thickness was   increased in a pattern of moderate LVH. Systolic function was   normal. The estimated ejection fraction was in the range of 55%   to 60%. Wall motion was normal; there were no regional wall   motion abnormalities. - Aortic valve: A bioprosthesis was present. There was moderate   stenosis. Valve area (VTI): 0.57 cm^2. Valve area (Vmax): 0.68   cm^2. Valve area (Vmean): 0.62 cm^2. - Mitral valve: Mildly calcified annulus. There was mild   regurgitation. - Left atrium: The atrium was moderately dilated. - Tricuspid valve: There was mild-moderate regurgitation. - Pulmonary arteries: Systolic pressure was moderately increased.   PA peak pressure: 55 mm Hg (S).    Patient Profile     66 y.o. female   Assessment & Plan    1. Atrial fibrillation with RVR - cardioversion today, on Xarelto 2.  Acute on chronic diastolic heart failure - volume  overloaded, switch PO lasix to IV 3.  Obesity 4.  Prosthetic aortic valve 5.  Long-term use of anticoagulation 6.  Hypokalemia needs repletion.  For questions or updates, please contact [facility] Please consult [email or web address] for contact info under Cardiology/STEMI. Daytime calls, contact the Day Call APP (6a-8a) or assigned team (Teams A-D) provider (7:30a - 5p). All other daytime calls (7:30-5p), contact the Card Master @ [phone].   Nighttime calls, contact the assigned APP (5p-8p) or MD (6:30p-8p). Overnight calls (8p-6a), contact the on call Fellow @ [phone].     Signed, Tobias AlexanderKatarina Lourene Hoston, MD  02/26/2017, 10:35 AM

## 2017-02-26 NOTE — CV Procedure (Signed)
Electrical Cardioversion Procedure Note Jennifer JumperBeatrice M Bauer [id number] 1951-04-27  Procedure: Electrical Cardioversion Indications:  Atrial Fibrillation  Procedure Details Consent: Risks of procedure as well as the alternatives and risks of each were explained to the (patient/caregiver).  Consent for procedure obtained. Time Out: Verified patient identification, verified procedure, site/side was marked, verified correct patient position, special equipment/implants available, medications/allergies/relevent history reviewed, required imaging and test results available.  Performed  Patient placed on cardiac monitor, pulse oximetry, supplemental oxygen as necessary.  Sedation given: propofol Pacer pads placed anterior and posterior chest.  Cardioverted 1 time(s).  Cardioverted at 200J.  Evaluation Findings: Post procedure EKG shows: NSR.  However, she went back into atrial fibrillation within 5 minutes.  Complications: None Patient did tolerate procedure well.   Chilton Siiffany [place], MD 02/26/2017, 12:07 PM

## 2017-02-26 NOTE — Interval H&P Note (Signed)
History and Physical Interval Note:  02/26/2017 11:35 AM  Jennifer JumperBeatrice M Trovato  has presented today for surgery, with the diagnosis of AFIB  The various methods of treatment have been discussed with the patient and family. After consideration of risks, benefits and other options for treatment, the patient has consented to  Procedure(s): CARDIOVERSION (N/A) as a surgical intervention .  The patient's history has been reviewed, patient examined, no change in status, stable for surgery.  I have reviewed the patient's chart and labs.  Questions were answered to the patient's satisfaction.     Chilton Siiffany [place], MD

## 2017-02-26 NOTE — Progress Notes (Signed)
Internal Medicine Attending:   I saw and examined the patient. I reviewed the resident's note and I agree with the resident's findings and plan as documented in the resident's note. Reports feeling a little better this morning however still complains of palpitations with any movement.  There had been plans for a TEE however she has been on A/C with xarelto, we discussed this with cardiology and have changed plans to cardioversion without TEE. On my exam: no distress, lungs grossly CTAB, heart irr irr, LE edema remains 2+ bilaterally

## 2017-02-26 NOTE — Clinical Social Work Note (Signed)
Clinical Social Work Assessment  Patient Details  Name: Jennifer Bauer MRN: [id number] Date of Birth: 1951-01-09  Date of referral:  02/26/17               Reason for consult:  Discharge Planning                Permission sought to share information with:  Chartered certified accountant granted to share information::  Yes, Verbal Permission Granted  Name::        Agency::  [facility]  Relationship::     Contact Information:     Housing/Transportation Living arrangements for the past 2 months:  [place] of Information:  Patient, Medical Team Patient Interpreter Needed:  None Criminal Activity/Legal Involvement Pertinent to Current Situation/Hospitalization:  No - Comment as needed Significant Relationships:  Siblings, Other Family Members Lives with:  Facility Resident Do you feel safe going back to the place where you live?  Yes Need for family participation in patient care:  Yes (Comment)  Care giving concerns:  Patient is a long-term care resident from [facility].   Social Worker assessment / plan:  CSW met with patient. No supports at bedside. CSW introduced role and explained that discharge planning would be discussed. Patient confirmed that she was admitted from [facility] and plans to return once medically stable for discharge. She will need PTAR. No further concerns. CSW encouraged patient to contact CSW as needed. CSW will continue to follow patient for support and facilitate discharge to SNF once medically stable.  Employment status:  Unemployed Forensic scientist:  Medicaid In [place] PT Recommendations:  Not assessed at this time Information / Referral to community resources:  [place]  Patient/Family's Response to care:  Patient agreeable to return to SNF. Patient's sister supportive and involved in patient's care. Patient appreciated social work intervention.  Patient/Family's  Understanding of and Emotional Response to Diagnosis, Current Treatment, and Prognosis:  Patient has a good understanding of the reason for admission and her need to return to SNF. Patient appears pleased with hospital care.  Emotional Assessment Appearance:  Appears stated age Attitude/Demeanor/Rapport:  Other (Pleasant) Affect (typically observed):  Accepting, Appropriate, Calm, Pleasant Orientation:  Oriented to Self, Oriented to Place, Oriented to  Time, Oriented to Situation Alcohol / Substance use:  Never Used Psych involvement (Current and /or in the community):  No (Comment)  Discharge Needs  Concerns to be addressed:  Care Coordination Readmission within the last 30 days:  No Current discharge risk:  None Barriers to Discharge:  Continued Medical Work up   Candie Chroman, LCSW 02/26/2017, 4:15 PM

## 2017-02-26 NOTE — Transfer of Care (Signed)
Immediate Anesthesia Transfer of Care Note  Patient: Jennifer Bauer  Procedure(s) Performed: Procedure(s): CARDIOVERSION (N/A)  Patient Location: Endoscopy Unit  Anesthesia Type:General  Level of Consciousness: awake and drowsy  Airway & Oxygen Therapy: Patient Spontanous Breathing and Patient connected to nasal cannula oxygen  Post-op Assessment: Report given to RN, Post -op Vital signs reviewed and stable and Patient moving all extremities X 4  Post vital signs: Reviewed and stable  Last Vitals:  Vitals:   02/26/17 1050 02/26/17 1210  BP: 116/80 108/75  Pulse:  (!) 106  Resp:  18  Temp: 36.5 C   SpO2:  97%    Last Pain:  Vitals:   02/26/17 1050  TempSrc: Oral  PainSc:       Patients Stated Pain Goal: 3 (02/23/17 1002)  Complications: No apparent anesthesia complications

## 2017-02-26 NOTE — Anesthesia Preprocedure Evaluation (Signed)
Anesthesia Evaluation  Patient identified by MRN, date of birth, ID band Patient awake    Reviewed: Allergy & Precautions, NPO status , Patient's Chart, lab work & pertinent test results  Airway Mallampati: II  TM Distance: >3 FB Neck ROM: Full    Dental no notable dental hx.    Pulmonary neg pulmonary ROS,    Pulmonary exam normal breath sounds clear to auscultation       Cardiovascular hypertension, + dysrhythmias Atrial Fibrillation  Rhythm:Irregular Rate:Abnormal + Systolic murmurs Was admitted [facility] 06/18/16-06/28/16 for lower extremity celluslitis with severe edema. Treated with antibiotics. Hospital course complicated by A. fib RVR. Discussed with Dr. Willadean CarolNahseragain and her beta blocker and calcium channel blocker increased. Planned for rate control and possible outpatient cardioversion. Echo during admission 06/26/16 showed EF 55-60%, mild- mod R/LAE, aortic sclerosis, mild to mod TR, G2DD, mild LVH and mild pulm HTN.   S/P AVR 2012    Neuro/Psych negative neurological ROS  negative psych ROS   GI/Hepatic negative GI ROS, Neg liver ROS,   Endo/Other  Morbid obesity  Renal/GU negative Renal ROS  negative genitourinary   Musculoskeletal negative musculoskeletal ROS (+)   Abdominal   Peds negative pediatric ROS (+)  Hematology negative hematology ROS (+)   Anesthesia Other Findings   Reproductive/Obstetrics negative OB ROS                             Anesthesia Physical  Anesthesia Plan  ASA: III  Anesthesia Plan: General   Post-op Pain Management:    Induction: Intravenous  PONV Risk Score and Plan: 2 and Ondansetron and Treatment may vary due to age or medical condition  Airway Management Planned: Mask  Additional Equipment:   Intra-op Plan:   Post-operative Plan:   Informed Consent: I have reviewed the patients History and Physical, chart, labs and discussed  the procedure including the risks, benefits and alternatives for the proposed anesthesia with the patient or authorized representative who has indicated his/her understanding and acceptance.   Dental advisory given  Plan Discussed with: CRNA and Surgeon  Anesthesia Plan Comments:         Anesthesia Quick Evaluation

## 2017-02-26 NOTE — Progress Notes (Signed)
 Progress Note  Patient Name: Jennifer Bauer Date of Encounter: 02/26/2017  Primary Cardiologist: Dr Nahser  Subjective   She didn't sleep well, she felt SOB. Now in chair.  Inpatient Medications    Scheduled Meds: . Chlorhexidine Gluconate Cloth  6 each Topical Q0600  . citalopram  20 mg Oral Daily  . furosemide  80 mg Oral BID  . hydrocerin   Topical Daily  . metoprolol tartrate  50 mg Oral BID  . mupirocin ointment  1 application Nasal BID  . pantoprazole  40 mg Oral Daily  . potassium chloride  20 mEq Oral Once  . potassium chloride  40 mEq Oral BID  . rivaroxaban  20 mg Oral Q supper  . traMADol  100 mg Oral BID   Continuous Infusions: . sodium chloride    . diltiazem (CARDIZEM) infusion 10 mg/hr (02/26/17 0329)   PRN Meds: ondansetron **OR** ondansetron (ZOFRAN) IV, polyethylene glycol   Vital Signs    Vitals:   02/26/17 0000 02/26/17 0328 02/26/17 0430 02/26/17 0742  BP: (!) 108/95 111/81  120/78  Pulse: (!) 39 84  (!) 112  Resp: (!) 24 17  (!) 26  Temp:  98.2 F (36.8 C)  98.8 F (37.1 C)  TempSrc:  Oral  Oral  SpO2: 97% 95%  94%  Weight:   268 lb 8.3 oz (121.8 kg)   Height:        Intake/Output Summary (Last 24 hours) at 02/26/17 1035 Last data filed at 02/26/17 0500  Gross per 24 hour  Intake           330.51 ml  Output             1050 ml  Net          -719.49 ml   Filed Weights   02/24/17 0600 02/25/17 0404 02/26/17 0430  Weight: 269 lb (122 kg) 267 lb 13.7 oz (121.5 kg) 268 lb 8.3 oz (121.8 kg)    Telemetry    A-fib with RVR 100-120 - Personally Reviewed  Physical Exam   GEN: No acute distress.   Neck: No JVD Cardiac: RRR, no murmurs, rubs, or gallops.  Respiratory: Decreased BS at the bases B/L. GI: Soft, nontender, non-distended  MS: No edema; No deformity. Neuro:  Nonfocal  Psych: Normal affect   Labs    Chemistry Recent Labs Lab 02/23/17 0736 02/24/17 0201 02/25/17 0331 02/26/17 0356  NA 136 136 136 137  K 3.4*  3.3* 3.1* 3.7  CL 100* 98* 99* 101  CO2 26 30 28 29  GLUCOSE 138* 114* 114* 125*  BUN 32* 24* 16 17  CREATININE 1.39* 1.15* 0.96 0.93  CALCIUM 8.8* 8.6* 8.2* 8.6*  PROT 6.4*  --  6.3* 6.6  ALBUMIN 3.2*  --  3.1* 3.0*  AST 85*  --  69* 42*  ALT 78*  --  91* 73*  ALKPHOS 89  --  88 92  BILITOT 2.4*  --  1.7* 1.5*  GFRNONAA 39* 49* >60 >60  GFRAA 45* 57* >60 >60  ANIONGAP 10 8 9 7     Hematology Recent Labs Lab 02/23/17 0736 02/25/17 0331 02/26/17 0356  WBC 6.5 7.0 6.1  RBC 3.98 3.94 4.01  HGB 8.9* 9.1* 8.9*  HCT 29.8* 29.7* 30.6*  MCV 74.9* 75.4* 76.3*  MCH 22.4* 23.1* 22.2*  MCHC 29.9* 30.6 29.1*  RDW 15.3 15.8* 15.5  PLT 201 180 203    Cardiac EnzymesNo results for input(s): TROPONINI   in the last 168 hours.  Recent Labs Lab 02/22/17 1047  TROPIPOC 0.03     BNP Recent Labs Lab 02/22/17 1030  BNP 1,588.4*     DDimer No results for input(s): DDIMER in the last 168 hours.   Radiology    No results found.  Cardiac Studies   TTE: 02/23/17 - Left ventricle: The cavity size was normal. Wall thickness was   increased in a pattern of moderate LVH. Systolic function was   normal. The estimated ejection fraction was in the range of 55%   to 60%. Wall motion was normal; there were no regional wall   motion abnormalities. - Aortic valve: A bioprosthesis was present. There was moderate   stenosis. Valve area (VTI): 0.57 cm^2. Valve area (Vmax): 0.68   cm^2. Valve area (Vmean): 0.62 cm^2. - Mitral valve: Mildly calcified annulus. There was mild   regurgitation. - Left atrium: The atrium was moderately dilated. - Tricuspid valve: There was mild-moderate regurgitation. - Pulmonary arteries: Systolic pressure was moderately increased.   PA peak pressure: 55 mm Hg (S).    Patient Profile     66 y.o. female   Assessment & Plan    1. Atrial fibrillation with RVR - cardioversion today, on Xarelto 2.  Acute on chronic diastolic heart failure - volume  overloaded, switch PO lasix to IV 3.  Obesity 4.  Prosthetic aortic valve 5.  Long-term use of anticoagulation 6.  Hypokalemia needs repletion.  For questions or updates, please contact [facility] Please consult [email or web address] for contact info under Cardiology/STEMI. Daytime calls, contact the Day Call APP (6a-8a) or assigned team (Teams A-D) provider (7:30a - 5p). All other daytime calls (7:30-5p), contact the Card Master @ [phone].   Nighttime calls, contact the assigned APP (5p-8p) or MD (6:30p-8p). Overnight calls (8p-6a), contact the on call Fellow @ [phone].     Signed, Tobias AlexanderKatarina Bevan Disney, MD  02/26/2017, 10:35 AM

## 2017-02-26 NOTE — Progress Notes (Signed)
Subjective: Patient stated that she felt better this morning with regard to her ability to breath. However, she continues to have mid-epigastric abdominal pain since the previous Monday. She stated that the pain fluctuates from unnoticeable to severe at times, is moderately improved with PO intake temporarily and is not made worse by anything she is aware of. Patient denied SOB, chest pain except occasionally in conjunction with extreme tachycardia. Patient is aware that she is going to be cardioverted today, and is in agreement with the plan to complete that and continue diuresis. Patient denied, headache, nausea, vomiting, diarrhea and fever.  Patient stated that mid-epigastric pain had improved significantly. Will continue to monitor.   Objective:  Vital signs in last 24 hours: Vitals:   02/25/17 2347 02/26/17 0000 02/26/17 0328 02/26/17 0430  BP: 113/90 (!) 108/95 111/81   Pulse: (!) 106 (!) 39 84   Resp: (!) 25 (!) 24 17   Temp: 98.4 F (36.9 C)  98.2 F (36.8 C)   TempSrc: Oral  Oral   SpO2: 96% 97% 95%   Weight:    268 lb 8.3 oz (121.8 kg)  Height:       ROS negative except as per HPI.  Physical Exam  Constitutional: She is oriented to person, place, and time. She appears well-developed and well-nourished. No distress.  HENT:  Head: Normocephalic and atraumatic.  Eyes: Conjunctivae and EOM are normal.  Neck: Normal range of motion.  Cardiovascular: Normal rate.  An irregularly irregular rhythm present.  No murmur heard. Pulmonary/Chest: Effort normal. No stridor.  Abdominal: Soft. Bowel sounds are normal. She exhibits no distension. There is tenderness (To deep palpation in teh mid epigastric region.).  Musculoskeletal: She exhibits no edema or tenderness.  Neurological: She is alert and oriented to person, place, and time.  Skin: Skin is warm. Capillary refill takes less than 2 seconds. She is not diaphoretic.  Psychiatric: She has a normal mood and affect. Her behavior  is normal.     Assessment/Plan:  Principal Problem:   Acute on chronic diastolic CHF (congestive heart failure), NYHA class 4 (HCC) Active Problems:   S/P AVR (aortic valve replacement)   Atrial fibrillation with rapid ventricular response (HCC)   Physical deconditioning   Multiple blisters both lower legs   Chronic anticoagulation   Atrial fibrillation with RVR (HCC)  Acute on chronic diastolic CHF (congestive heart failure), NYHA class 4 (HCC). Echo on Saturday was negative for any significant changes. She has significant lower extremity edema, and bilateral basal crackles. She will need more aggressive diuresis, we will increase her diuretic regimen once she was cardioverted. -IV lasix today, cardiology discontinued PO   Atrial fibrillation with rapid ventricular response (HCC). Continue to remain in A. Fib with heart rate in the low 100s, she remained on Cardizem infusion at 12.5 mg per hour. -Cardiology wants to cardiovert her Today  -TEE was canceled due to lack of indication at this time -Continue Xarelto.  Hypokalemia: -40 mEq PO twice daily given. -Potassium 3.7 today -will continue to monitor and treat as necessary    Microcytic anemia.Hb =8.9, MCV=76. Her baseline between Hb has been around 9-11 recently. Seems to be chronic.  -Monitor CBC daily  -Checked Ferritin, low at 7 -Follow up in clinic for probable iron deficiency anemia  AKI: Cr improved to 0.93 tdoay. -continue daily BMP to monitor function -will increase diuretic following cardioversion today  Transaminitis: AST:92, ALT:57 . Previously normal. Most likely due to hypoperfusion/hypoxia in setting of  recent CHF exacerbation and Afib. -We will check hepatic function again tomorrow.  Diet: Diet, NPO Fluids: NONE Code: Full DVT ppx: Home Xarelto Dispo: Anticipated discharge in approximately 2-3 day(s).   Lanelle Bal, MD 02/26/2017, 7:09 AM Pager: Pager# [phone]

## 2017-02-27 ENCOUNTER — Inpatient Hospital Stay (HOSPITAL_COMMUNITY): Payer: Medicaid Other

## 2017-02-27 DIAGNOSIS — R1013 Epigastric pain: Secondary | ICD-10-CM

## 2017-02-27 DIAGNOSIS — R740 Nonspecific elevation of levels of transaminase and lactic acid dehydrogenase [LDH]: Secondary | ICD-10-CM

## 2017-02-27 DIAGNOSIS — R000 Tachycardia, unspecified: Secondary | ICD-10-CM

## 2017-02-27 DIAGNOSIS — M546 Pain in thoracic spine: Secondary | ICD-10-CM

## 2017-02-27 DIAGNOSIS — R072 Precordial pain: Secondary | ICD-10-CM

## 2017-02-27 LAB — BASIC METABOLIC PANEL
ANION GAP: 9 (ref 5–15)
BUN: 20 mg/dL (ref 6–20)
CALCIUM: 8.6 mg/dL — AB (ref 8.9–10.3)
CO2: 29 mmol/L (ref 22–32)
CREATININE: 1.03 mg/dL — AB (ref 0.44–1.00)
Chloride: 99 mmol/L — ABNORMAL LOW (ref 101–111)
GFR calc non Af Amer: 56 mL/min — ABNORMAL LOW (ref >60)
Glucose, Bld: 119 mg/dL — ABNORMAL HIGH (ref 65–99)
Potassium: 3.8 mmol/L (ref 3.5–5.1)
Sodium: 137 mmol/L (ref 135–145)

## 2017-02-27 MED ORDER — AMIODARONE HCL IN DEXTROSE 360-4.14 MG/200ML-% IV SOLN
INTRAVENOUS | Status: AC
  Filled 2017-02-27: qty 200

## 2017-02-27 MED ORDER — DILTIAZEM HCL 60 MG PO TABS
60.0000 mg | ORAL_TABLET | Freq: Four times a day (QID) | ORAL | Status: DC
  Administered 2017-02-27: 60 mg via ORAL
  Filled 2017-02-27: qty 1

## 2017-02-27 MED ORDER — AMIODARONE LOAD VIA INFUSION
150.0000 mg | Freq: Once | INTRAVENOUS | Status: DC
  Administered 2017-02-28: 150 mg via INTRAVENOUS

## 2017-02-27 MED ORDER — METOPROLOL TARTRATE 50 MG PO TABS
100.0000 mg | ORAL_TABLET | Freq: Two times a day (BID) | ORAL | Status: DC
  Administered 2017-02-27: 100 mg via ORAL
  Filled 2017-02-27 (×2): qty 2

## 2017-02-27 MED ORDER — AMIODARONE IV BOLUS ONLY 150 MG/100ML
150.0000 mg | Freq: Once | INTRAVENOUS | Status: AC
  Administered 2017-02-27: 150 mg via INTRAVENOUS
  Filled 2017-02-27: qty 100

## 2017-02-27 MED ORDER — FUROSEMIDE 10 MG/ML IJ SOLN
40.0000 mg | Freq: Once | INTRAMUSCULAR | Status: AC
Start: 2017-02-27 — End: 2017-02-27
  Administered 2017-02-27: 40 mg via INTRAVENOUS

## 2017-02-27 NOTE — Progress Notes (Signed)
Progress Note  Patient Name: Jennifer JumperBeatrice M Rogala Date of Encounter: 02/27/2017  Primary Cardiologist: Dr Elease HashimotoNahser  Subjective   She feels slightly better today.  Inpatient Medications    Scheduled Meds: . Chlorhexidine Gluconate Cloth  6 each Topical Q0600  . citalopram  20 mg Oral Daily  . diltiazem  60 mg Oral Q6H  . furosemide  40 mg Intravenous BID  . hydrocerin   Topical Daily  . metoprolol tartrate  50 mg Oral BID  . pantoprazole  40 mg Oral Daily  . potassium chloride  20 mEq Oral Once  . potassium chloride  40 mEq Oral BID  . rivaroxaban  20 mg Oral Q supper  . traMADol  100 mg Oral BID   Continuous Infusions:  PRN Meds: ondansetron **OR** ondansetron (ZOFRAN) IV, polyethylene glycol   Vital Signs    Vitals:   02/26/17 2300 02/27/17 0537 02/27/17 0540 02/27/17 0737  BP: 116/87 114/76    Pulse: 81 73  (!) 109  Resp: (!) 23 (!) 23    Temp: 98.3 F (36.8 C) 97.6 F (36.4 C)  98.4 F (36.9 C)  TempSrc: Oral Oral  Oral  SpO2: 92% 92%  92%  Weight:   268 lb 1.3 oz (121.6 kg)   Height:        Intake/Output Summary (Last 24 hours) at 02/27/17 1127 Last data filed at 02/27/17 0900  Gross per 24 hour  Intake          1055.17 ml  Output              950 ml  Net           105.17 ml   Filed Weights   02/25/17 0404 02/26/17 0430 02/27/17 0540  Weight: 267 lb 13.7 oz (121.5 kg) 268 lb 8.3 oz (121.8 kg) 268 lb 1.3 oz (121.6 kg)    Telemetry    A-fib with RVR 100-120 - Personally Reviewed  Physical Exam   GEN: No acute distress.   Neck: No JVD Cardiac: RRR, no murmurs, rubs, or gallops.  Respiratory: Decreased BS at the bases B/L. GI: Soft, nontender, non-distended  MS: No edema; No deformity. Neuro:  Nonfocal  Psych: Normal affect   Labs    Chemistry Recent Labs Lab 02/23/17 0736  02/25/17 0331 02/26/17 0356 02/27/17 0155  NA 136  < > 136 137 137  K 3.4*  < > 3.1* 3.7 3.8  CL 100*  < > 99* 101 99*  CO2 26  < > 28 29 29   GLUCOSE 138*  < >  114* 125* 119*  BUN 32*  < > 16 17 20   CREATININE 1.39*  < > 0.96 0.93 1.03*  CALCIUM 8.8*  < > 8.2* 8.6* 8.6*  PROT 6.4*  --  6.3* 6.6  --   ALBUMIN 3.2*  --  3.1* 3.0*  --   AST 85*  --  69* 42*  --   ALT 78*  --  91* 73*  --   ALKPHOS 89  --  88 92  --   BILITOT 2.4*  --  1.7* 1.5*  --   GFRNONAA 39*  < > >60 >60 56*  GFRAA 45*  < > >60 >60 >60  ANIONGAP 10  < > 9 7 9   < > = values in this interval not displayed.   Hematology  Recent Labs Lab 02/23/17 0736 02/25/17 0331 02/26/17 0356  WBC 6.5 7.0 6.1  RBC 3.98 3.94 4.01  HGB 8.9* 9.1* 8.9*  HCT 29.8* 29.7* 30.6*  MCV 74.9* 75.4* 76.3*  MCH 22.4* 23.1* 22.2*  MCHC 29.9* 30.6 29.1*  RDW 15.3 15.8* 15.5  PLT 201 180 203    Cardiac EnzymesNo results for input(s): TROPONINI in the last 168 hours.   Recent Labs Lab 02/22/17 1047  TROPIPOC 0.03     BNP  Recent Labs Lab 02/22/17 1030  BNP 1,588.4*     DDimer No results for input(s): DDIMER in the last 168 hours.   Radiology    No results found.  Cardiac Studies   TTE: 02/23/17 - Left ventricle: The cavity size was normal. Wall thickness was   increased in a pattern of moderate LVH. Systolic function was   normal. The estimated ejection fraction was in the range of 55%   to 60%. Wall motion was normal; there were no regional wall   motion abnormalities. - Aortic valve: A bioprosthesis was present. There was moderate   stenosis. Valve area (VTI): 0.57 cm^2. Valve area (Vmax): 0.68   cm^2. Valve area (Vmean): 0.62 cm^2. - Mitral valve: Mildly calcified annulus. There was mild   regurgitation. - Left atrium: The atrium was moderately dilated. - Tricuspid valve: There was mild-moderate regurgitation. - Pulmonary arteries: Systolic pressure was moderately increased.   PA peak pressure: 55 mm Hg (S).    Patient Profile     66 y.o. female   Assessment & Plan    1. Atrial fibrillation with RVR - cardioversion today, on Xarelto, failed cardioversion  yesterday, I will stop cardizem as she is hypotensive, increase metoprolol to 100 mg po BID and start amiodarone load.  2.  Acute on chronic diastolic heart failure - volume overloaded, switch PO lasix to IV, minimal diuresis, I will obtain CXR crea stable 3.  Obesity 4.  Prosthetic aortic valve 5.  Long-term use of anticoagulation 6.  Hypokalemia needs repletion.  For questions or updates, please contact [facility] Please consult [email or web address] for contact info under Cardiology/STEMI. Daytime calls, contact the Day Call APP (6a-8a) or assigned team (Teams A-D) provider (7:30a - 5p). All other daytime calls (7:30-5p), contact the Card Master @ [phone].   Nighttime calls, contact the assigned APP (5p-8p) or MD (6:30p-8p). Overnight calls (8p-6a), contact the on call Fellow @ [phone].     Signed, Tobias Alexander, MD  02/27/2017, 11:27 AM

## 2017-02-27 NOTE — Anesthesia Postprocedure Evaluation (Signed)
Anesthesia Post Note  Patient: Jennifer Bauer  Procedure(s) Performed: Procedure(s) (LRB): CARDIOVERSION (N/A)     Patient location during evaluation: Endoscopy Anesthesia Type: General Level of consciousness: awake and alert Pain management: pain level controlled Vital Signs Assessment: post-procedure vital signs reviewed and stable Respiratory status: spontaneous breathing, nonlabored ventilation, respiratory function stable and patient connected to nasal cannula oxygen Cardiovascular status: blood pressure returned to baseline and stable Postop Assessment: no signs of nausea or vomiting Anesthetic complications: no    Last Vitals:  Vitals:   02/27/17 0537 02/27/17 0737  BP: 114/76   Pulse: 73 (!) 109  Resp: (!) 23   Temp: 36.4 C 36.9 C  SpO2: 92% 92%    Last Pain:  Vitals:   02/27/17 0737  TempSrc: Oral  PainSc: 0-No pain   Pain Goal: Patients Stated Pain Goal: 3 (02/23/17 1002)               Phillips Groutarignan, Jayveon Convey

## 2017-02-27 NOTE — Progress Notes (Addendum)
Ms. Jennifer Bauer was seen by both junior and senior residents in her stepdown room at approximately 21:00 prior to her transfer to a telemetry unit. She said that she was doing well and had no worsening in her sob, chest pain, or abdominal pain. She stated that she was comfortable when laying down. She did not have any sob or chest pain when laying down. She stated that she had some blurry vision and dizziness.  She was saturating at 95% on 5L [place], and her oxygen requirement decreased from previous 6L to 5L via nasal cannula. The patient is net up by +1120. While in the room the patient her heart rhythm was still in atrial fibrillation and her heart rate was in the 120's. She was clear to auscultate bilaterally, had irregular heart rhythm, had 2+ pitting edema on exam and stated that she was tender to touch in her bilateral lower extremities when examining.   Due to the patient's tachycardia, her being hypotensive (106/78), and the inability to use IV medication on the floor we opted against transferring her to a telemetry floor. Requested nurse to give metoprolol 100mg  and to monitor rates. After administering metoprolol the patient's heart rate has ranged between 108-115.  -Cancelled transfer and kept patient in higher acuity bed -Ordered metoprolol 100mg   Lorenso CourierVahini Berkeley Vanaken, MD Internal Medicine PGY1 Pager:[phone] 02/27/2017, 9:13 PM

## 2017-02-27 NOTE — Progress Notes (Addendum)
Subjective: Patient was sitting up in her bed today upon entering the room watching TV while eating her breakfast. She stated that the abdominal pain was much less severe while sitting. Patient stated that the chest pain was only concerning during activity, relieved with rest. This pain is directly associated with palpitations. Patient stated that the swelling her legs seemed to improve somewhat. Patient slept well overnight. Denied SOB, fever, cough, chills, headache, nausea, vomiting or diarrhea.   Objective:  Vital signs in last 24 hours: Vitals:   02/26/17 2300 02/27/17 0537 02/27/17 0540 02/27/17 0737  BP: 116/87 114/76    Pulse: 81 73  (!) 109  Resp: (!) 23 (!) 23    Temp: 98.3 F (36.8 C) 97.6 F (36.4 C)  98.4 F (36.9 C)  TempSrc: Oral Oral  Oral  SpO2: 92% 92%  92%  Weight:   268 lb 1.3 oz (121.6 kg)   Height:       ROS was negative except as per HPI.  Physical Exam  Constitutional: She appears well-developed and well-nourished. No distress.  HENT:  Head: Normocephalic and atraumatic.  Eyes: EOM are normal.  Neck: Normal range of motion.  Cardiovascular: An irregularly irregular rhythm present. Tachycardia present.   No murmur heard. Pulmonary/Chest: Effort normal and breath sounds normal. No respiratory distress.  Abdominal: Soft. Bowel sounds are normal. She exhibits no distension. There is no tenderness.  Musculoskeletal: She exhibits edema and tenderness.  Neurological: She is alert.  Skin: Skin is warm. She is not diaphoretic.  Psychiatric: She has a normal mood and affect.    Assessment/Plan:  Principal Problem:   Acute on chronic diastolic CHF (congestive heart failure), NYHA class 4 (HCC) Active Problems:   S/P AVR (aortic valve replacement)   Atrial fibrillation with rapid ventricular response (HCC)   Physical deconditioning   Multiple blisters both lower legs   Chronic anticoagulation   Atrial fibrillation with RVR (HCC)  Acute on chronic  diastolic CHF (congestive heart failure), NYHA class 4 (HCC). Echo on Saturday was negative for any significant changes. She has significant lower extremity edema, and bilateral basal crackles. She will need more aggressive diuresis, we will increase her diuretic regimen once she was cardioverted. -IV lasix today, cardiology discontinued PO   Atrial fibrillation with rapid ventricular response (HCC).  Continue to remain in A. Fib with heart rate in the low 100s, she remained on Cardizem infusion at 15 mg per hour. -Cardiology cardioverted, but patient returned to a-fib within . -Patient to be placed on Cardizem  q6 with discontinuation of her IV dilt -Continue Xarelto. -[place] Vasc score of 2 with 4% risk  Hypokalemia: -40 mEq PO twice daily given. -Potassium 3.8 today -will continue to monitor and treat as necessary   Mid-Epigstric pain: Improved over the previous day. -Will continue to monitor and consider increasing her protonix if this does not resolve.   Microcytic anemia. Hb =8.9, MCV=76. Her baseline Hb has been around 9-11 recently. Seems to be chronic.  -Will repeat CBC if indicated  -Checked Ferritin, low at 7 -Follow up in clinic for probable iron deficiency anemia  AKI: Cr slightly worsened to 1.03 today. -continue daily BMP to monitor function -Lasix laterally moved from  PO BID to  IV BID, will continue at this does given Cr reaction  Transaminitis: AST:42, ALT:73 . Improving. Most likely due to hypoperfusion/hypoxia in setting of recent CHF exacerbation and Afib. -We will check hepatic function again tomorrow.  Diet: Heart healthy Fluids:  NONE Code: Full DVT ppx: Home Xarelto Dispo: Anticipated discharge in approximately 1-2 day(s).   Jennifer Bauer, Jennifer Buchta, MD 02/27/2017, 10:03 AM Pager: Pager# [phone]

## 2017-02-27 NOTE — Progress Notes (Signed)
Report called to [facility] RN.  RN agreeable to transfer, pt agreeable to transfer.  Will transfer pt in low bed.

## 2017-02-27 NOTE — Progress Notes (Signed)
RN contacted Dr. Crista ElliotHarbrecht d/t patient requiring up to 5L/[place] oxygen to keep sats greater than 92%.  Right lung base very diminished, greater than left.  Requested CXR for assessment. Patient remains on IV Lasix BID and BLE swelling has decreased.  VSS. Presently 121/80.  Also paged Dr. Eloy EndK Nelson regarding Amiodarone gtt.  No return page yet.

## 2017-02-27 NOTE — Progress Notes (Signed)
HR 120-140 Afib, Resident notified and made aware of pending transfer.  Ok to transfer to [employer] [facility].

## 2017-02-28 DIAGNOSIS — R5381 Other malaise: Secondary | ICD-10-CM

## 2017-02-28 DIAGNOSIS — G8929 Other chronic pain: Secondary | ICD-10-CM

## 2017-02-28 LAB — COMPREHENSIVE METABOLIC PANEL
ALT: 59 U/L — ABNORMAL HIGH (ref 14–54)
ANION GAP: 7 (ref 5–15)
AST: 36 U/L (ref 15–41)
Albumin: 3.1 g/dL — ABNORMAL LOW (ref 3.5–5.0)
Alkaline Phosphatase: 93 U/L (ref 38–126)
BUN: 23 mg/dL — ABNORMAL HIGH (ref 6–20)
CALCIUM: 8.7 mg/dL — AB (ref 8.9–10.3)
CHLORIDE: 99 mmol/L — AB (ref 101–111)
CO2: 29 mmol/L (ref 22–32)
Creatinine, Ser: 1.08 mg/dL — ABNORMAL HIGH (ref 0.44–1.00)
GFR, EST NON AFRICAN AMERICAN: 53 mL/min — AB (ref >60)
Glucose, Bld: 122 mg/dL — ABNORMAL HIGH (ref 65–99)
Potassium: 4 mmol/L (ref 3.5–5.1)
SODIUM: 135 mmol/L (ref 135–145)
Total Bilirubin: 1.9 mg/dL — ABNORMAL HIGH (ref 0.3–1.2)
Total Protein: 6.4 g/dL — ABNORMAL LOW (ref 6.5–8.1)

## 2017-02-28 LAB — CBC
HCT: 30.1 % — ABNORMAL LOW (ref 36.0–46.0)
Hemoglobin: 8.8 g/dL — ABNORMAL LOW (ref 12.0–15.0)
MCH: 22 pg — AB (ref 26.0–34.0)
MCHC: 29.2 g/dL — AB (ref 30.0–36.0)
MCV: 75.3 fL — AB (ref 78.0–100.0)
PLATELETS: 183 10*3/uL (ref 150–400)
RBC: 4 MIL/uL (ref 3.87–5.11)
RDW: 15.6 % — ABNORMAL HIGH (ref 11.5–15.5)
WBC: 5.6 10*3/uL (ref 4.0–10.5)

## 2017-02-28 MED ORDER — INFLUENZA VAC SPLIT QUAD 0.5 ML IM SUSY
0.5000 mL | PREFILLED_SYRINGE | INTRAMUSCULAR | Status: AC
  Administered 2017-03-01: 0.5 mL via INTRAMUSCULAR
  Filled 2017-02-28: qty 0.5

## 2017-02-28 MED ORDER — AMIODARONE HCL IN DEXTROSE 360-4.14 MG/200ML-% IV SOLN
60.0000 mg/h | INTRAVENOUS | Status: AC
  Administered 2017-02-28 (×2): 60 mg/h via INTRAVENOUS
  Filled 2017-02-28: qty 200

## 2017-02-28 MED ORDER — METOPROLOL TARTRATE 50 MG PO TABS
50.0000 mg | ORAL_TABLET | Freq: Two times a day (BID) | ORAL | Status: DC
  Administered 2017-02-28 – 2017-03-02 (×4): 50 mg via ORAL
  Filled 2017-02-28 (×4): qty 1

## 2017-02-28 MED ORDER — AMIODARONE LOAD VIA INFUSION: 150.0000 mg | Freq: Once | INTRAVENOUS | Status: DC

## 2017-02-28 MED ORDER — AMIODARONE HCL IN DEXTROSE 360-4.14 MG/200ML-% IV SOLN
INTRAVENOUS | Status: AC
  Administered 2017-02-28: 150 mg via INTRAVENOUS
  Filled 2017-02-28: qty 200

## 2017-02-28 MED ORDER — AMIODARONE HCL IN DEXTROSE 360-4.14 MG/200ML-% IV SOLN
30.0000 mg/h | INTRAVENOUS | Status: DC
  Administered 2017-02-28 – 2017-03-03 (×5): 30 mg/h via INTRAVENOUS
  Filled 2017-02-28 (×5): qty 200

## 2017-02-28 NOTE — Progress Notes (Signed)
Progress Note  Patient Name: Jennifer Bauer Date of Encounter: 02/28/2017  Primary Cardiologist: Dr Elease HashimotoNahser  Subjective   She feels slightly better this am.  Inpatient Medications    Scheduled Meds: . citalopram  20 mg Oral Daily  . furosemide  40 mg Intravenous BID  . hydrocerin   Topical Daily  . metoprolol tartrate  100 mg Oral BID  . pantoprazole  40 mg Oral Daily  . potassium chloride  40 mEq Oral BID  . rivaroxaban  20 mg Oral Q supper  . traMADol  100 mg Oral BID   Continuous Infusions:  PRN Meds: polyethylene glycol   Vital Signs    Vitals:   02/27/17 2248 02/28/17 0500 02/28/17 0745 02/28/17 0956  BP:  106/89 101/84 95/76  Pulse: (!) 114 (!) 103 (!) 127   Resp:  20 20 (!) 25  Temp: 97.7 F (36.5 C) 97.7 F (36.5 C) 98.6 F (37 C)   TempSrc: Oral Oral Oral   SpO2: 91% 95%  97%  Weight:  269 lb 2.9 oz (122.1 kg)    Height:        Intake/Output Summary (Last 24 hours) at 02/28/17 1007 Last data filed at 02/28/17 0643  Gross per 24 hour  Intake           663.67 ml  Output              350 ml  Net           313.67 ml   Filed Weights   02/26/17 0430 02/27/17 0540 02/28/17 0500  Weight: 268 lb 8.3 oz (121.8 kg) 268 lb 1.3 oz (121.6 kg) 269 lb 2.9 oz (122.1 kg)   Telemetry    A-fib with RVR 115-150 - Personally Reviewed  Physical Exam   GEN: No acute distress.   Neck: No JVD Cardiac: iRRR, no murmurs, rubs, or gallops.  Respiratory: Decreased BS at the bases B/L. GI: Soft, nontender, non-distended  MS: No edema; No deformity. Neuro:  Nonfocal  Psych: Normal affect   Labs    Chemistry  Recent Labs Lab 02/25/17 0331 02/26/17 0356 02/27/17 0155 02/28/17 0228  NA 136 137 137 135  K 3.1* 3.7 3.8 4.0  CL 99* 101 99* 99*  CO2 28 29 29 29   GLUCOSE 114* 125* 119* 122*  BUN 16 17 20  23*  CREATININE 0.96 0.93 1.03* 1.08*  CALCIUM 8.2* 8.6* 8.6* 8.7*  PROT 6.3* 6.6  --  6.4*  ALBUMIN 3.1* 3.0*  --  3.1*  AST 69* 42*  --  36  ALT 91*  73*  --  59*  ALKPHOS 88 92  --  93  BILITOT 1.7* 1.5*  --  1.9*  GFRNONAA >60 >60 56* 53*  GFRAA >60 >60 >60 >60  ANIONGAP 9 7 9 7      Hematology  Recent Labs Lab 02/25/17 0331 02/26/17 0356 02/28/17 0228  WBC 7.0 6.1 5.6  RBC 3.94 4.01 4.00  HGB 9.1* 8.9* 8.8*  HCT 29.7* 30.6* 30.1*  MCV 75.4* 76.3* 75.3*  MCH 23.1* 22.2* 22.0*  MCHC 30.6 29.1* 29.2*  RDW 15.8* 15.5 15.6*  PLT 180 203 183    Cardiac EnzymesNo results for input(s): TROPONINI in the last 168 hours.   Recent Labs Lab 02/22/17 1047  TROPIPOC 0.03     BNP  Recent Labs Lab 02/22/17 1030  BNP 1,588.4*     DDimer No results for input(s): DDIMER in the last 168 hours.  Radiology    Dg Chest Port 1 View  Result Date: 02/27/2017 CLINICAL DATA:  Dyspnea today EXAM: PORTABLE CHEST 1 VIEW COMPARISON:  02/22/2017 CXR FINDINGS: Chronic stable cardiomegaly with aortic valvular replacement and aortic atherosclerosis. Confluent airspace opacity in the right lower lobe raise concern for brain pneumonic consolidation and/or pleural effusion. Mild vascular congestion is noted with calcified granuloma in the left upper lobe. IMPRESSION: 1. Stable cardiomegaly with aortic atherosclerosis. Status post aortic valvular replacement. 2. New confluent opacity at the right lung base obscuring the right hemidiaphragm and portions of the right heart border. Findings likely reflect atelectasis and/or pneumonia. Superimposed effusion is likewise not excluded. 3. Mild vascular congestion. 4. Left upper lobe calcified nodule consistent with a granuloma. Electronically Signed   By: Tollie Eth M.D.   On: 02/27/2017 15:52    Cardiac Studies   TTE: 02/23/17 - Left ventricle: The cavity size was normal. Wall thickness was   increased in a pattern of moderate LVH. Systolic function was   normal. The estimated ejection fraction was in the range of 55%   to 60%. Wall motion was normal; there were no regional wall   motion  abnormalities. - Aortic valve: A bioprosthesis was present. There was moderate   stenosis. Valve area (VTI): 0.57 cm^2. Valve area (Vmax): 0.68   cm^2. Valve area (Vmean): 0.62 cm^2. - Mitral valve: Mildly calcified annulus. There was mild   regurgitation. - Left atrium: The atrium was moderately dilated. - Tricuspid valve: There was mild-moderate regurgitation. - Pulmonary arteries: Systolic pressure was moderately increased.   PA peak pressure: 55 mm Hg (S).    Patient Profile     66 y.o. female   Assessment & Plan    1. Atrial fibrillation with RVR - cardioversion yesterday unsuccessful, on Xarelto, I will restart iv amiodarone drip, I will decrease metoprolol to 50 mg po BID as she is hypotensive 2.  Acute on chronic diastolic heart failure - volume overloaded, switched PO lasix to IV with minimal diuresis 3.  Obesity 4.  Prosthetic aortic valve 5.  Long-term use of anticoagulation 6.  Hypokalemia needs repletion.  For questions or updates, please contact [facility] Please consult [email or web address] for contact info under Cardiology/STEMI. Daytime calls, contact the Day Call APP (6a-8a) or assigned team (Teams A-D) provider (7:30a - 5p). All other daytime calls (7:30-5p), contact the Card Master @ [phone].   Nighttime calls, contact the assigned APP (5p-8p) or MD (6:30p-8p). Overnight calls (8p-6a), contact the on call Fellow @ [phone].     Signed, Tobias Alexander, MD  02/28/2017, 10:07 AM

## 2017-02-28 NOTE — Progress Notes (Signed)
Subjective: Patient was sitting in her chair today upon entering the room. She stated that she felt better today, was still experiencing mild abdominal pain, but the SOB had resolved. She was comfortable at 5L O2 and denied concerns other than the abdominal pain. Patients O2 was turned down to 3L's which she stated was well tolerated. She was made aware of the plan and agreed to continue to permit [place] to treat her rapid heart rate and irregular rhythm.   Objective:  Vital signs in last 24 hours: Vitals:   02/27/17 1700 02/27/17 1900 02/27/17 2248 02/28/17 0500  BP:  106/78 114/87 106/89  Pulse:  (!) 120 (!) 114 (!) 103  Resp:  (!) Temp: 99.1 F (37.3 C) 98.2 F (36.8 C) 97.7 F (36.5 C) 97.7 F (36.5 C)  TempSrc: Oral Oral Oral Oral  SpO2:  92% 91% 95%  Weight:    269 lb 2.9 oz (122.1 kg)  Height:       ROS negative except as per HPI.  Physical Exam  Constitutional: She appears well-developed and well-nourished. No distress.  HENT:  Head: Normocephalic and atraumatic.  Eyes: EOM are normal.  Neck: Normal range of motion.  Cardiovascular: An irregularly irregular rhythm present.  Pulmonary/Chest: Effort normal. No respiratory distress. She has decreased breath sounds in the right lower field.  Abdominal: Soft. Bowel sounds are normal. She exhibits no distension. There is tenderness (throughout ).  Musculoskeletal: She exhibits edema and tenderness.  Neurological: She is alert.  Skin: Skin is warm. Capillary refill takes less than 2 seconds. She is not diaphoretic.    Assessment/Plan:  Principal Problem:   Acute on chronic diastolic CHF (congestive heart failure), NYHA class 4 (HCC) Active Problems:   S/P AVR (aortic valve replacement)   Atrial fibrillation with rapid ventricular response (HCC)   Physical deconditioning   Multiple blisters both lower legs   Chronic anticoagulation   Atrial fibrillation with RVR (HCC)  Acute on chronic diastolic CHF  (congestive heart failure), NYHA class 4 (HCC). Echo on Saturdaywas negative for any significant changes. She has significant lower extremity edema, and bilateral basal crackles. -IV lasix today, cardiology discontinued PO  -Patient given an extra dose of IV lasix  the prior day secondary to increased O2 demand and SOB with decreased lung sounds int the RLL.  -Overnight team observed patient saturating well, canceled transfer to stepdown as patient is back on IV medications  Atrial fibrillation with rapid ventricular response (HCC).  Continue to remain in A. Fib with heart rate in the low 100s, she remained on Cardizem infusion at 15 mg per hour. -Cardiology cardioverted, but patient returned to a-fib within 78min-see below -Cardiology placed patient on a loading dose of IV Amiodarone, will await further recommendations -Cardiology placed patient on continuous amiodarone infusion at /hr for six hours followed by /hr continuous -cardizem discontinued by cardiology secondary to possible hypotension and the need to increase diuresis  -Continue Xarelto. -[place] Vasc score of 2 with 4% risk  Hypokalemia: -40 mEq PO twice daily given. -Potassium 4.0 today -Will continue to monitor and treat as necessary   Mid-Epigstric pain: Improved over the previous day. Patient stated that it is chronic and consistent  -Will continue to monitor and consider increasing her protonix if this does not resolve.  -Pain slightly better today, worse when NPO.   Microcytic anemia. Hb =8.8, MCV=75.3. Her baseline Hgb has been around 9-11 recently. Seems to be chronic.  -Will repeat CBC  as indicated  -CheckedFerritin, low at 7 -Follow up in clinic for probableiron deficiency anemia  AKI: Cr slightly worsened to 1.08 today. -continue daily BMP to monitor function -Lasix laterally moved from 80mg  PO BID to 40mg  IV BID, will continue at this does given Cr reaction -extra dose given the prior  day, will continue to monitor  Transaminitis: AST:36, ALT:59. Improving. Most likely due to hypoperfusion/hypoxia in setting of recent CHF exacerbation and Afib. -We will check hepatic function again tomorrow.  Diet: Heart healthy Fluids: NONE Code: Full DVT ppx: Home Xarelto Dispo: Anticipated discharge in approximately 2-3 day(s).   Lanelle BalHarbrecht, Evert Wenrich, MD 02/28/2017, 7:06 AM Pager: Pager# [phone]

## 2017-03-01 ENCOUNTER — Inpatient Hospital Stay (HOSPITAL_COMMUNITY): Payer: Medicaid Other

## 2017-03-01 LAB — COMPREHENSIVE METABOLIC PANEL
ALBUMIN: 3 g/dL — AB (ref 3.5–5.0)
ALT: 59 U/L — ABNORMAL HIGH (ref 14–54)
ANION GAP: 8 (ref 5–15)
AST: 39 U/L (ref 15–41)
Alkaline Phosphatase: 95 U/L (ref 38–126)
BILIRUBIN TOTAL: 1.6 mg/dL — AB (ref 0.3–1.2)
BUN: 25 mg/dL — ABNORMAL HIGH (ref 6–20)
CO2: 28 mmol/L (ref 22–32)
Calcium: 8.8 mg/dL — ABNORMAL LOW (ref 8.9–10.3)
Chloride: 98 mmol/L — ABNORMAL LOW (ref 101–111)
Creatinine, Ser: 1.06 mg/dL — ABNORMAL HIGH (ref 0.44–1.00)
GFR calc Af Amer: >60 mL/min (ref >60)
GFR calc non Af Amer: 54 mL/min — ABNORMAL LOW (ref >60)
GLUCOSE: 133 mg/dL — AB (ref 65–99)
POTASSIUM: 4.2 mmol/L (ref 3.5–5.1)
Sodium: 134 mmol/L — ABNORMAL LOW (ref 135–145)
TOTAL PROTEIN: 6.5 g/dL (ref 6.5–8.1)

## 2017-03-01 LAB — CBC
HCT: 31.2 % — ABNORMAL LOW (ref 36.0–46.0)
Hemoglobin: 9.1 g/dL — ABNORMAL LOW (ref 12.0–15.0)
MCH: 21.9 pg — ABNORMAL LOW (ref 26.0–34.0)
MCHC: 29.2 g/dL — AB (ref 30.0–36.0)
MCV: 75 fL — ABNORMAL LOW (ref 78.0–100.0)
PLATELETS: 191 10*3/uL (ref 150–400)
RBC: 4.16 MIL/uL (ref 3.87–5.11)
RDW: 15.7 % — ABNORMAL HIGH (ref 11.5–15.5)
WBC: 7.1 10*3/uL (ref 4.0–10.5)

## 2017-03-01 MED ORDER — GI COCKTAIL ~~LOC~~
30.0000 mL | Freq: Once | ORAL | Status: AC
  Administered 2017-03-01: 30 mL via ORAL
  Filled 2017-03-01: qty 30

## 2017-03-01 MED ORDER — POTASSIUM CHLORIDE CRYS ER 20 MEQ PO TBCR
30.0000 meq | EXTENDED_RELEASE_TABLET | Freq: Two times a day (BID) | ORAL | Status: DC
  Administered 2017-03-01 – 2017-03-02 (×2): 30 meq via ORAL
  Filled 2017-03-01 (×2): qty 1

## 2017-03-01 MED ORDER — PANTOPRAZOLE SODIUM 40 MG IV SOLR
40.0000 mg | Freq: Every day | INTRAVENOUS | Status: DC
  Administered 2017-03-01 – 2017-03-07 (×7): 40 mg via INTRAVENOUS
  Filled 2017-03-01 (×7): qty 40

## 2017-03-01 MED ORDER — DIGOXIN 0.25 MG/ML IJ SOLN
0.1250 mg | Freq: Once | INTRAMUSCULAR | Status: AC
  Administered 2017-03-01: 0.125 mg via INTRAVENOUS
  Filled 2017-03-01: qty 2

## 2017-03-01 MED ORDER — FUROSEMIDE 10 MG/ML IJ SOLN
60.0000 mg | Freq: Four times a day (QID) | INTRAMUSCULAR | Status: DC
  Administered 2017-03-01 – 2017-03-02 (×4): 60 mg via INTRAVENOUS
  Filled 2017-03-01 (×4): qty 6

## 2017-03-01 MED ORDER — CALCIUM CARBONATE ANTACID 500 MG PO CHEW
1.0000 | CHEWABLE_TABLET | ORAL | Status: DC | PRN
  Administered 2017-03-01 – 2017-03-02 (×2): 200 mg via ORAL
  Filled 2017-03-01 (×2): qty 1

## 2017-03-01 MED ORDER — FUROSEMIDE 10 MG/ML IJ SOLN: 60.0000 mg | Freq: Two times a day (BID) | INTRAMUSCULAR | Status: DC

## 2017-03-01 MED ORDER — DICLOFENAC SODIUM 1 % TD GEL
4.0000 g | Freq: Four times a day (QID) | TRANSDERMAL | Status: DC
  Administered 2017-03-01 – 2017-03-09 (×31): 4 g via TOPICAL
  Filled 2017-03-01: qty 100

## 2017-03-01 NOTE — Progress Notes (Signed)
Subjective: Patient states that she continues to have this midepigastric/lower left-sided chest pain for 12 days now. The pain is worse when not eating, not improved with GI cocktail, not improved with Protonix by mouth, unimproved with IV Protonix, not progressively worsening, not improving, but does decrease with minimal consumption of food. The patient denied headache, nausea, vomiting, fever, chills, SOB, or headache. She states that the palpitations are often associated with mild pain when she moves around. Patient noted that perhaps there was not pain but instead she is more aware of the palpitations when moving.   Objective:  Vital signs in last 24 hours: Vitals:   02/28/17 2300 03/01/17 0300 03/01/17 0400 03/01/17 0716  BP: 111/89 113/87  (!) 112/91  Pulse: 75 (!) 125  (!) 122  Resp: 19 (!) 24  (!) 21  Temp: 97.9 F (36.6 C) 97.9 F (36.6 C)  97.7 F (36.5 C)  TempSrc: Oral Oral  Oral  SpO2: 97% 92%  96%  Weight:   268 lb 8.3 oz (121.8 kg)   Height:       ROS negative except as per HPI.  Physical Exam  Constitutional: She is oriented to person, place, and time. She appears well-developed and well-nourished. No distress.  HENT:  Head: Normocephalic.  Eyes: Conjunctivae and EOM are normal.  Neck: Normal range of motion. Neck supple.  Cardiovascular: Normal rate and regular rhythm.   No murmur heard. Pulmonary/Chest: Effort normal. No respiratory distress. She has decreased breath sounds in the right lower field.  Abdominal: Soft. Bowel sounds are normal. She exhibits no distension. There is tenderness (Mild midepigastric).  Musculoskeletal: She exhibits edema (Bilateral 2+ pittting edema, seemingly unimproved since admission. ).  Neurological: She is alert and oriented to person, place, and time.  Skin: Skin is warm. Capillary refill takes less than 2 seconds. She is not diaphoretic.  Psychiatric: She has a normal mood and affect.   Assessment/Plan:  Principal  Problem:   Acute on chronic diastolic CHF (congestive heart failure), NYHA class 4 (HCC) Active Problems:   S/P AVR (aortic valve replacement)   Atrial fibrillation with rapid ventricular response (HCC)   Physical deconditioning   Multiple blisters both lower legs   Chronic anticoagulation   Atrial fibrillation with RVR (HCC)  Acute on chronic diastolic CHF (congestive heart failure), NYHA class 4 (HCC). Echo on Saturdaywas negative for any significant changes. She has significant lower extremity edema, and bilateral basal crackles. -IV lasix today continued today at but increased to 60mg  BID from 40mg  due to lack of sufficient response -Patients SOB improved significantly, on 2L's O2  Atrial fibrillation with rapid ventricular response (HCC).  -Cardiology placed patient on a loading dose of IV Amiodarone -The following day she was placed on an additional 60mg /hr for six hours followed by continuous 30mg /hr -cardizem had been discontinued by cardiology secondary to possible hypotension and the need to increase diuresis  -Continue Xarelto. -[place] Vasc score of 2 with 4% risk -We would greatly appreciate the cardiologists assistance with obtaining rate control in this patient  Hypokalemia: -40 mEq PO twice daily decreased to 30mEq PO BID -Potassium 4.2today -Will continue to monitor and treat as necessary   Mid-Epigstric pain: Improved over the previous day. Patient stated that it is chronic and consistent  -Pain slightly better today than overnight. Patient stated that it had worsened overnight -Overnight team observed patient and treated her with a GI cocktail, PPI IV, and Voltaren gel. Patient stated that the gel reduced the  pain slightly, but denied improving pain with the GI solutions.   Microcytic anemia. Hb =9.1, MCV=75.0, Her baseline Hgb has been around 9-11 recently. Seems to be chronic.  -Will repeat CBC as indicated  -Follow up in clinic for probableiron  deficiency anemia -Given the patients GI upset, will hold PO iron at this time.   AKI: Cr slightly improved to 1.06today. -continue daily BMP to monitor function -Lasix laterally moved from  PO BID to  IV BID,  -Increased lasix to  IV BID today,  -extra dose of  IV given two days prior, will continue to monitor  Transaminitis: AST:39, ALT:59. Improving still.Most likely due to hypoperfusion/hypoxia in setting of recent CHF exacerbation and Afib.  -We will check hepatic function again tomorrow.  Diet: Heart healthy Fluids: NONE Code: Full DVT ppx: Home Xarelto Dispo: Anticipated discharge in approximately 2-3 day(s).   Lanelle Bal, MD 03/01/2017, 10:36 AM Pager: Pager# [phone]

## 2017-03-01 NOTE — Progress Notes (Addendum)
Was paged by the nurse stating that the patient has been complaining of chest pain. The junior and senior residents went to patient's bedside. The patient states that she has been having chest pain that is 9/10 intensity, has been intermittently present all day, nonpleuritic, and feels tight. The patient's heart rate ranged between 120-130s while in the room and the rhythm was afib. The patient also stated that she had abdominal pain that has been worsening over the course of the day. The patient states that she has had a bowel movement today.   On examination, the patient had tenderness to palpation on the chest, irregular rhythm and tachycardia present on auscultation. Bowel sounds were heard and epigastric tenderness was present to palpation. The patient had swelling in bilateral legs, but no edema.   We deemed that the patient's chest pain was likely linked to the abdominal pain, possibly due to GERD.   -Requested nurse to elevate head of bed -placed on pantoprazole  iv -GI cocktail -Voltaren gel to be placed over anterior chest  -Calcium carbonate prescribed  -Continue miralax

## 2017-03-01 NOTE — Progress Notes (Signed)
Progress Note  Patient Name: Jennifer Bauer Date of Encounter: 03/01/2017  Primary Cardiologist: Dr Elease HashimotoNahser  Subjective   The patient complained of chest pain this morning, now improved.  Inpatient Medications    Scheduled Meds: . amiodarone  150 mg Intravenous Once  . citalopram  20 mg Oral Daily  . diclofenac sodium  4 g Topical QID  . digoxin  0.125 mg Intravenous Once  . furosemide  60 mg Intravenous BID  . hydrocerin   Topical Daily  . metoprolol tartrate  50 mg Oral BID  . pantoprazole (PROTONIX) IV  40 mg Intravenous QHS  . potassium chloride  30 mEq Oral BID  . rivaroxaban  20 mg Oral Q supper  . traMADol  100 mg Oral BID   Continuous Infusions: . amiodarone 30 mg/hr (02/28/17 1829)   PRN Meds: calcium carbonate, polyethylene glycol   Vital Signs    Vitals:   03/01/17 0300 03/01/17 0400 03/01/17 0716 03/01/17 1100  BP: 113/87  (!) 112/91 118/75  Pulse: (!) 125  (!) 122 (!) 123  Resp: (!) 24  (!) 21   Temp: 97.9 F (36.6 C)  97.7 F (36.5 C) 97.7 F (36.5 C)  TempSrc: Oral  Oral Oral  SpO2: 92%  96%   Weight:  268 lb 8.3 oz (121.8 kg)    Height:        Intake/Output Summary (Last 24 hours) at 03/01/17 1220 Last data filed at 03/01/17 1100  Gross per 24 hour  Intake            673.6 ml  Output             1000 ml  Net           -326.4 ml   Filed Weights   02/27/17 0540 02/28/17 0500 03/01/17 0400  Weight: 268 lb 1.3 oz (121.6 kg) 269 lb 2.9 oz (122.1 kg) 268 lb 8.3 oz (121.8 kg)   Telemetry    A-fib with RVR 115-150 - Personally Reviewed  Physical Exam   GEN: No acute distress.  obese Neck: No JVD Cardiac: iRRR, no murmurs, rubs, or gallops.  Respiratory: mild crackles at the bases B/L. GI: Soft, nontender, non-distended  MS: mild B/L edema; No deformity. Neuro:  Nonfocal  Psych: Normal affect   Labs    Chemistry  Recent Labs Lab 02/26/17 0356 02/27/17 0155 02/28/17 0228 03/01/17 0549  NA 137 137 135 134*  K 3.7 3.8 4.0  4.2  CL 101 99* 99* 98*  CO2 29 29 29 28   GLUCOSE 125* 119* 122* 133*  BUN 17 20 23* 25*  CREATININE 0.93 1.03* 1.08* 1.06*  CALCIUM 8.6* 8.6* 8.7* 8.8*  PROT 6.6  --  6.4* 6.5  ALBUMIN 3.0*  --  3.1* 3.0*  AST 42*  --  36 39  ALT 73*  --  59* 59*  ALKPHOS 92  --  93 95  BILITOT 1.5*  --  1.9* 1.6*  GFRNONAA >60 56* 53* 54*  GFRAA >60 >60 >60 >60  ANIONGAP 7 9 7 8      Hematology  Recent Labs Lab 02/26/17 0356 02/28/17 0228 03/01/17 0549  WBC 6.1 5.6 7.1  RBC 4.01 4.00 4.16  HGB 8.9* 8.8* 9.1*  HCT 30.6* 30.1* 31.2*  MCV 76.3* 75.3* 75.0*  MCH 22.2* 22.0* 21.9*  MCHC 29.1* 29.2* 29.2*  RDW 15.5 15.6* 15.7*  PLT 203 183 191    Cardiac EnzymesNo results for input(s): TROPONINI in the  last 168 hours.  No results for input(s): TROPIPOC in the last 168 hours.   BNP No results for input(s): BNP, PROBNP in the last 168 hours.   DDimer No results for input(s): DDIMER in the last 168 hours.   Radiology    Dg Chest Port 1 View  Result Date: 02/27/2017 CLINICAL DATA:  Dyspnea today EXAM: PORTABLE CHEST 1 VIEW COMPARISON:  02/22/2017 CXR FINDINGS: Chronic stable cardiomegaly with aortic valvular replacement and aortic atherosclerosis. Confluent airspace opacity in the right lower lobe raise concern for brain pneumonic consolidation and/or pleural effusion. Mild vascular congestion is noted with calcified granuloma in the left upper lobe. IMPRESSION: 1. Stable cardiomegaly with aortic atherosclerosis. Status post aortic valvular replacement. 2. New confluent opacity at the right lung base obscuring the right hemidiaphragm and portions of the right heart border. Findings likely reflect atelectasis and/or pneumonia. Superimposed effusion is likewise not excluded. 3. Mild vascular congestion. 4. Left upper lobe calcified nodule consistent with a granuloma. Electronically Signed   By: Tollie Eth M.D.   On: 02/27/2017 15:52    Cardiac Studies   TTE: 02/23/17 - Left ventricle: The  cavity size was normal. Wall thickness was   increased in a pattern of moderate LVH. Systolic function was   normal. The estimated ejection fraction was in the range of 55%   to 60%. Wall motion was normal; there were no regional wall   motion abnormalities. - Aortic valve: A bioprosthesis was present. There was moderate   stenosis. Valve area (VTI): 0.57 cm^2. Valve area (Vmax): 0.68   cm^2. Valve area (Vmean): 0.62 cm^2. - Mitral valve: Mildly calcified annulus. There was mild   regurgitation. - Left atrium: The atrium was moderately dilated. - Tricuspid valve: There was mild-moderate regurgitation. - Pulmonary arteries: Systolic pressure was moderately increased.   PA peak pressure: 55 mm Hg (S).    Patient Profile     66 y.o. female   Assessment & Plan    1. Atrial fibrillation with RVR - cardioversion yesterday unsuccessful, on Xarelto, I will restart iv amiodarone drip, metoprolol to 50 mg po BID as she is hypotensive, add digoxin 0.125 mg iv x 1 2.  Acute on chronic diastolic heart failure - volume overloaded, increase lasix to IV Q6H 3.  Obesity 4.  Prosthetic aortic valve 5.  Long-term use of anticoagulation 6.  Hypokalemia needs repletion.  For questions or updates, please contact [facility] Please consult [email or web address] for contact info under Cardiology/STEMI. Daytime calls, contact the Day Call APP (6a-8a) or assigned team (Teams A-D) provider (7:30a - 5p). All other daytime calls (7:30-5p), contact the Card Master @ [phone].   Nighttime calls, contact the assigned APP (5p-8p) or MD (6:30p-8p). Overnight calls (8p-6a), contact the on call Fellow @ [phone].     Signed, Tobias Alexander, MD  03/01/2017, 12:20 PM

## 2017-03-02 DIAGNOSIS — R0789 Other chest pain: Secondary | ICD-10-CM

## 2017-03-02 LAB — BASIC METABOLIC PANEL
ANION GAP: 10 (ref 5–15)
BUN: 20 mg/dL (ref 6–20)
CALCIUM: 8.6 mg/dL — AB (ref 8.9–10.3)
CO2: 31 mmol/L (ref 22–32)
Chloride: 95 mmol/L — ABNORMAL LOW (ref 101–111)
Creatinine, Ser: 0.97 mg/dL (ref 0.44–1.00)
GFR calc Af Amer: >60 mL/min (ref >60)
GFR calc non Af Amer: >60 mL/min (ref >60)
GLUCOSE: 90 mg/dL (ref 65–99)
Potassium: 3.3 mmol/L — ABNORMAL LOW (ref 3.5–5.1)
Sodium: 136 mmol/L (ref 135–145)

## 2017-03-02 MED ORDER — POTASSIUM CHLORIDE 10 MEQ/100ML IV SOLN
10.0000 meq | INTRAVENOUS | Status: AC
  Administered 2017-03-02 (×2): 10 meq via INTRAVENOUS
  Filled 2017-03-02 (×2): qty 100

## 2017-03-02 MED ORDER — SUCRALFATE 1 GM/10ML PO SUSP
1.0000 g | Freq: Two times a day (BID) | ORAL | Status: DC
  Administered 2017-03-02 – 2017-03-09 (×14): 1 g via ORAL
  Filled 2017-03-02 (×14): qty 10

## 2017-03-02 MED ORDER — FUROSEMIDE 10 MG/ML IJ SOLN
60.0000 mg | Freq: Two times a day (BID) | INTRAMUSCULAR | Status: DC
Start: 2017-03-02 — End: 2017-03-03
  Administered 2017-03-02 – 2017-03-03 (×2): 60 mg via INTRAVENOUS
  Filled 2017-03-02 (×2): qty 6

## 2017-03-02 MED ORDER — METOPROLOL TARTRATE 25 MG PO TABS
25.0000 mg | ORAL_TABLET | Freq: Two times a day (BID) | ORAL | Status: DC
  Administered 2017-03-02 – 2017-03-04 (×4): 25 mg via ORAL
  Filled 2017-03-02 (×4): qty 1

## 2017-03-02 MED ORDER — POTASSIUM CHLORIDE CRYS ER 20 MEQ PO TBCR
40.0000 meq | EXTENDED_RELEASE_TABLET | Freq: Once | ORAL | Status: AC
  Administered 2017-03-02: 40 meq via ORAL
  Filled 2017-03-02: qty 2

## 2017-03-02 MED ORDER — SUCRALFATE 1 GM/10ML PO SUSP
1.0000 g | Freq: Two times a day (BID) | ORAL | Status: DC
  Filled 2017-03-02: qty 10

## 2017-03-02 MED ORDER — DIGOXIN 0.25 MG/ML IJ SOLN
0.1250 mg | Freq: Once | INTRAMUSCULAR | Status: AC
  Administered 2017-03-02: 0.125 mg via INTRAVENOUS
  Filled 2017-03-02: qty 2

## 2017-03-02 NOTE — Progress Notes (Signed)
Progress Note  Patient Name: Jennifer Bauer Date of Encounter: 03/02/2017  Primary Cardiologist: Dr Elease Hashimoto  Subjective   Breathing improved this am.  Inpatient Medications    Scheduled Meds: . amiodarone  150 mg Intravenous Once  . citalopram  20 mg Oral Daily  . diclofenac sodium  4 g Topical QID  . furosemide  60 mg Intravenous BID  . hydrocerin   Topical Daily  . metoprolol tartrate  50 mg Oral BID  . pantoprazole (PROTONIX) IV  40 mg Intravenous QHS  . potassium chloride  30 mEq Oral BID  . rivaroxaban  20 mg Oral Q supper  . traMADol  100 mg Oral BID   Continuous Infusions: . amiodarone 30 mg/hr (03/02/17 0300)   PRN Meds: calcium carbonate, polyethylene glycol   Vital Signs    Vitals:   03/01/17 2000 03/02/17 0300 03/02/17 0500 03/02/17 0805  BP: (!) 72/60   (!) 118/46  Pulse:  (!) 122 99 (!) 102  Resp: 20 (!) 32 19 (!) 25  Temp: 98.7 F (37.1 C)   97.7 F (36.5 C)  TempSrc: Oral   Oral  SpO2:  94% 96% 96%  Weight:   266 lb 12.1 oz (121 kg)   Height:    (1.702 m)     Intake/Output Summary (Last 24 hours) at 03/02/17 0925 Last data filed at 03/02/17 0800  Gross per 24 hour  Intake           707.61 ml  Output             1925 ml  Net         -1217.39 ml   Filed Weights   02/28/17 0500 03/01/17 0400 03/02/17 0500  Weight: 269 lb 2.9 oz (122.1 kg) 268 lb 8.3 oz (121.8 kg) 266 lb 12.1 oz (121 kg)   Telemetry    A-fib with RVR 115-150 - Personally Reviewed  Physical Exam   GEN: No acute distress.   Neck: No JVD Cardiac: iRRR, no murmurs, rubs, or gallops.  Respiratory: Decreased BS at the bases B/L. GI: Soft, nontender, non-distended  MS: No edema; No deformity. Neuro:  Nonfocal  Psych: Normal affect   Labs    Chemistry  Recent Labs Lab 02/26/17 0356  02/28/17 0228 03/01/17 0549 03/02/17 0657  NA 137  < > 135 134* 136  K 3.7  < > 4.0 4.2 3.3*  CL 101  < > 99* 98* 95*  CO2 29  < > GLUCOSE 125*  < > 122* 133* 90    BUN 17  < > 23* 25* 20  CREATININE 0.93  < > 1.08* 1.06* 0.97  CALCIUM 8.6*  < > 8.7* 8.8* 8.6*  PROT 6.6  --  6.4* 6.5  --   ALBUMIN 3.0*  --  3.1* 3.0*  --   AST 42*  --  36 39  --   ALT 73*  --  59* 59*  --   ALKPHOS 92  --  93 95  --   BILITOT 1.5*  --  1.9* 1.6*  --   GFRNONAA >60  < > 53* 54* >60  GFRAA >60  < > >60 >60 >60  ANIONGAP 7  < > < > = values in this interval not displayed.   Hematology  Recent Labs Lab 02/26/17 0356 02/28/17 0228 03/01/17 0549  WBC 6.1 5.6 7.1  RBC 4.01 4.00 4.16  HGB 8.9*  8.8* 9.1*  HCT 30.6* 30.1* 31.2*  MCV 76.3* 75.3* 75.0*  MCH 22.2* 22.0* 21.9*  MCHC 29.1* 29.2* 29.2*  RDW 15.5 15.6* 15.7*  PLT 203 183 191    Cardiac EnzymesNo results for input(s): TROPONINI in the last 168 hours.  No results for input(s): TROPIPOC in the last 168 hours.   BNP No results for input(s): BNP, PROBNP in the last 168 hours.   DDimer No results for input(s): DDIMER in the last 168 hours.   Radiology    Dg Chest Port 1 View  Result Date: 03/01/2017 CLINICAL DATA:  Shortness of breath. EXAM: PORTABLE CHEST 1 VIEW COMPARISON:  Radiographs February 27, 2017. FINDINGS: Stable cardiomegaly. Sternotomy wires are noted. No pneumothorax or significant pleural effusion is noted. Stable right basilar atelectasis or infiltrate is noted. Stable left midlung scarring is noted. Stable calcified granuloma is noted in left upper lobe. Sternotomy wires are noted. Bony thorax is unremarkable. IMPRESSION: Stable right basilar opacity consistent with atelectasis or infiltrate. Electronically Signed   By: Lupita Raider, M.D.   On: 03/01/2017 12:24    Cardiac Studies   TTE: 02/23/17 - Left ventricle: The cavity size was normal. Wall thickness was   increased in a pattern of moderate LVH. Systolic function was   normal. The estimated ejection fraction was in the range of 55%   to 60%. Wall motion was normal; there were no regional wall   motion  abnormalities. - Aortic valve: A bioprosthesis was present. There was moderate   stenosis. Valve area (VTI): 0.57 cm^2. Valve area (Vmax): 0.68   cm^2. Valve area (Vmean): 0.62 cm^2. - Mitral valve: Mildly calcified annulus. There was mild   regurgitation. - Left atrium: The atrium was moderately dilated. - Tricuspid valve: There was mild-moderate regurgitation. - Pulmonary arteries: Systolic pressure was moderately increased.   PA peak pressure: 55 mm Hg (S).    Patient Profile     66 y.o. female   Assessment & Plan    1. Atrial fibrillation with RVR - cardioversion on 9/12 unsuccessful, on Xarelto, restarted iv amiodarone drip, decreased dose of  metoprolol to 25 mg po BID as she is hypotensive, add another dose of digoxin 0.125 mg iv x 1 2.  Acute on chronic diastolic heart failure - volume overloaded, she finally started to diurese with switch to IV Lasix. I will replace potassium.  3.  Obesity 4.  Prosthetic aortic valve 5.  Long-term use of anticoagulation 6.  Hypokalemia needs repletion.  This patient is morbidly obese, with long term immobility, multiple medical problems, we will ask case manager for placement once discharged.    For questions or updates, please contact [facility] Please consult [email or web address] for contact info under Cardiology/STEMI. Daytime calls, contact the Day Call APP (6a-8a) or assigned team (Teams A-D) provider (7:30a - 5p). All other daytime calls (7:30-5p), contact the Card Master @ [phone].   Nighttime calls, contact the assigned APP (5p-8p) or MD (6:30p-8p). Overnight calls (8p-6a), contact the on call Fellow @ [phone].     Signed, Tobias Alexander, MD  03/02/2017, 9:25 AM

## 2017-03-02 NOTE — Progress Notes (Signed)
Subjective:  The patient was sitting in her chair watching TV while waiting for breakfast upon entering the room. Patient stated that she slept much better than last night as her chest pain and abdominal pain had greatly improved. She denied headache, visual changes, nausea, vomiting, diarrhea, constipation, fever, or chills. She was informed that her blood pressure and improve this morning and that her heart rate was trending downward. Patient stated that she felt less short of breath today and did not believe she had experienced similar palpitations she had in the past. Patient stated that she would like for us to continue the Voltaren gel for her chest pain at this time.   Objective:  Vital signs in last 24 hours: Vitals:   03/01/17 1930 03/01/17 2000 03/02/17 0300 03/02/17 0500  BP:  (!) 72/60    Pulse: (!) 49  (!) 122 99  Resp: (!) 26 20 (!) 32 19  Temp:  98.7 F (37.1 C)    TempSrc:  Oral    SpO2: 96%  94% 96%  Weight:    266 lb 12.1 oz (121 kg)  Height:    5\' 7"  (1.702 m)   ROS negative except as per HPI.  Physical Exam  Constitutional: She is oriented to person, place, and time. She appears well-developed and well-nourished. No distress.  HENT:  Head: Normocephalic and atraumatic.  Neck: Normal range of motion.  Cardiovascular: Normal rate.  An irregularly irregular rhythm present.  Pulmonary/Chest: Effort normal. No respiratory distress. She has decreased breath sounds (Not improved from the previous day) in the right lower field.  Abdominal: Soft. Bowel sounds are normal. She exhibits no distension. There is no tenderness.  Musculoskeletal: She exhibits tenderness (Improved with voltaren gel, overlying the heart). She exhibits no edema.  Neurological: She is alert and oriented to person, place, and time.  Skin: Skin is warm. Capillary refill takes less than 2 seconds. She is not diaphoretic.  Psychiatric: She has a normal mood and affect.   Assessment/Plan:  Principal  Problem:   Acute on chronic diastolic CHF (congestive heart failure), NYHA class 4 (HCC) Active Problems:   S/P AVR (aortic valve replacement)   Atrial fibrillation with rapid ventricular response (HCC)   Physical deconditioning   Multiple blisters both lower legs   Chronic anticoagulation   Atrial fibrillation with RVR (HCC)  Acute on chronic diastolic CHF (congestive heart failure), NYHA class 4 (HCC). Echo on Saturdaywas negative for any significant changes. -Discontinued furosemide 60mg  q6 and initiated lower dose of 60mg  BID today -Patient's SOB improved significantly, saturating well on room air -Patient's repeat CXR the prior day appeared consistent with the study on 9/11, demonstrating persistent pulmonary edema in a patient who remains afebrile, denies cough, absent leukocytosis, and without physical signs of pneumonia -bilateral distal lower extremity edema, Unna boots   Atrial fibrillation with rapid ventricular response (HCC).  --Cardiology continuing IV amiodarone, second dose of digoxin 0.125 given today -Cardiology decreased Metoprolol to 25 BID -cardizem had been discontinued by cardiology secondary to possible hypotension and the need to increase diuresis  -Continue Xarelto. -[place] Vasc score of 2 with 4% risk  Hypokalemia: -40 mEq PO twice daily decreased to <MEASUREMDone<MEASUREMENDonell [id number]<MEASUREMENDonell [id number] 08El<MEASUREMENDonell [id number]<MEASUREMENDonell [id number] EricisePotassium 3.3today -Will continue to monitor and treat as necessary  -please replace with IV K+ if levels deficient again. -Patient dose not tolerate increased doses of PO potassium with GI upset  Mid-Epigstric pain and costovertebral pain: Improved over the previous day. Patient stated that it is chronic  and consistent  -Pain slightly better today than overnight. Patient stated that it had worsened overnight -Overnight team observed patient and treated her with a GI cocktail, PPI IV, and Voltaren gel. Patient stated that the gel reduced the pain slightly, but denied improving pain with the  GI solutions.  -Pain is worsened with increased doses of PO potassium! -Voltaren gel continued for costovertebral pain  Microcytic anemia. Hb =9.1 & MCV=75.0 the prior day, Her baseline Hgb has been around 9-11 recently. Seems to be chronic.  -Will repeat CBC asindicated  -Follow up in clinic for probableiron deficiency anemia -Given the patients GI upset, will hold PO iron at this time.   AKI: Cr slightly improved to 0.97today. -continue daily BMP to monitor function -Increased lasix to  IV BID today,   Transaminitis: AST:39, ALT:59 Yesterday.Most likely due to hypoperfusion/hypoxia in setting of recent CHF exacerbation and Afib.  -We will check hepatic function if indicated but trended downward since admission.  Diet: Heart healthy Fluids: NONE Code: Full DVT ppx: Home Xarelto Dispo: Anticipated discharge in approximately 1-3 day(s).   Lanelle Bal, MD 03/02/2017, 7:36 AM Pager: Pager# [phone]

## 2017-03-02 NOTE — Progress Notes (Signed)
Orthopedic Tech Progress Note Patient Details:  Jennifer Bauer 1950-06-28 [id number]  Ortho Devices Type of Ortho Device: Roland Rack boot Ortho Device/Splint Location: Bilateral Ortho Device/Splint Interventions: Application   Alvina Chou 03/02/2017, 8:16 PM

## 2017-03-03 LAB — BASIC METABOLIC PANEL
ANION GAP: 7 (ref 5–15)
BUN: 24 mg/dL — ABNORMAL HIGH (ref 6–20)
CALCIUM: 8.8 mg/dL — AB (ref 8.9–10.3)
CO2: 32 mmol/L (ref 22–32)
Chloride: 98 mmol/L — ABNORMAL LOW (ref 101–111)
Creatinine, Ser: 1.29 mg/dL — ABNORMAL HIGH (ref 0.44–1.00)
GFR, EST AFRICAN AMERICAN: 50 mL/min — AB (ref >60)
GFR, EST NON AFRICAN AMERICAN: 43 mL/min — AB (ref >60)
Glucose, Bld: 110 mg/dL — ABNORMAL HIGH (ref 65–99)
Potassium: 4.3 mmol/L (ref 3.5–5.1)
SODIUM: 137 mmol/L (ref 135–145)

## 2017-03-03 LAB — CBC
HCT: 30.6 % — ABNORMAL LOW (ref 36.0–46.0)
HEMOGLOBIN: 8.9 g/dL — AB (ref 12.0–15.0)
MCH: 21.8 pg — ABNORMAL LOW (ref 26.0–34.0)
MCHC: 29.1 g/dL — ABNORMAL LOW (ref 30.0–36.0)
MCV: 74.8 fL — ABNORMAL LOW (ref 78.0–100.0)
PLATELETS: 161 10*3/uL (ref 150–400)
RBC: 4.09 MIL/uL (ref 3.87–5.11)
RDW: 16 % — ABNORMAL HIGH (ref 11.5–15.5)
WBC: 5 10*3/uL (ref 4.0–10.5)

## 2017-03-03 MED ORDER — AMIODARONE HCL 200 MG PO TABS
400.0000 mg | ORAL_TABLET | Freq: Three times a day (TID) | ORAL | Status: DC
  Administered 2017-03-03 – 2017-03-08 (×14): 400 mg via ORAL
  Filled 2017-03-03 (×14): qty 2

## 2017-03-03 MED ORDER — FUROSEMIDE 10 MG/ML IJ SOLN
80.0000 mg | Freq: Two times a day (BID) | INTRAMUSCULAR | Status: DC
  Administered 2017-03-03: 80 mg via INTRAVENOUS
  Filled 2017-03-03: qty 8

## 2017-03-03 NOTE — Progress Notes (Signed)
Subjective: Patient was sitting up in chair watching TV today upon entering the room. Patient denied concerns other than the recurrent sternocostal pain and mid epigastric pain. Patient stated that both have improved somewhat over the prior day. The sternocostal pain improves significantly with full chair in general application. The mid-epigastric pain appears to resolve with discontinuation of potassium tablets PO.   Objective:  Vital signs in last 24 hours: Vitals:   03/03/17 0500 03/03/17 0600 03/03/17 0808 03/03/17 1159  BP: 112/84 (!) 127/95 106/69 102/67  Pulse: (!) 114 93 (!) 117   Resp:      Temp:   97.8 F (36.6 C) 97.7 F (36.5 C)  TempSrc:   Oral Oral  SpO2: 91% 91% 94%   Weight:      Height:       ROS negative except as per HPI.  Physical Exam  Constitutional: She appears well-developed and well-nourished. No distress.  HENT:  Head: Atraumatic.  Eyes: EOM are normal.  Neck: Normal range of motion. Neck supple.  Cardiovascular: An irregularly irregular rhythm present. Tachycardia present.   No murmur heard. Pulmonary/Chest: Effort normal and breath sounds normal. No respiratory distress.  Abdominal: Soft. Bowel sounds are normal. She exhibits no distension. There is no tenderness.  Musculoskeletal: She exhibits edema and tenderness.  Neurological: She is alert.  Skin: She is not diaphoretic.   Assessment/Plan:  Principal Problem:   Acute on chronic diastolic CHF (congestive heart failure), NYHA class 4 (HCC) Active Problems:   S/P AVR (aortic valve replacement)   Atrial fibrillation with rapid ventricular response (HCC)   Physical deconditioning   Multiple blisters both lower legs   Chronic anticoagulation   Atrial fibrillation with RVR (HCC)   Sternocostal pain  Acute on chronic diastolic CHF (congestive heart failure), NYHA class 4 (HCC). Echo on Saturdaywas negative for any significant changes. -furosemide  IV BID today by cards -Patient's SOB  improved significantly, saturating well on room air -Patient's repeat CXR the prior day appeared consistent with the study on 9/11, demonstrating persistent pulmonary edema in a patient who remains afebrile, denies cough, absent leukocytosis, and without physical signs of pneumonia -bilateral distal lower extremity edema, Unna boots  -Will consider transfer to the floor as she is no longer on IV drips   Atrial fibrillation with rapid ventricular response (HCC).  -Cardiology discontinuing IV amiodarone -Cardiology initiated Amiodarone  PO q8hrs  -Cardiology decreased Metoprolol to 25 BID -Cardizem had been discontinued by cardiology secondary to possible hypotension and the need to increase diuresis  -Continue Xarelto. -[place] Vasc score of 2 with 4% risk  Hypokalemia: -Potassium 4.3today -Will continue to monitor and treat as necessary  -please replace with IV K+ if levels deficient again. -Patient dose not tolerate increased doses of PO potassium as they worsen GI pain  Mid-Epigstric pain and costovertebral pain: Improved over the previous day again. Patient stated that it is chronic  -Pain slightly better today than overnight again today.  -sucralfate given with minor improvement  -Pain is worsened with increased doses of PO potassium, switched to IV potassium -Voltaren gel continued for costovertebral pain  Microcytic anemia. Hb =8.9 & MCV=74.8 today,Her baseline Hgb has been around 9-11 recently. Seems to be chronic.  -Will repeat CBC asindicated  -Follow up in clinic for probableiron deficiency anemia -Given the patients GI upset, will hold PO iron at this time.   AKI: Cr slightly improvedto 1.29today. -continue daily BMP to monitor function -Increased lasix to  IV BID today,  Transaminitis: Resolving.Most likely due to hypoperfusion/hypoxia in setting of recent CHF exacerbation and Afib.  -We will check hepatic function if indicated but trended  downward since admission.  Diet: Heart healthy Fluids: NONE Code: Full DVT ppx: Home Xarelto Dispo: Anticipated discharge in approximately next week.   Lanelle Bal, MD 03/03/2017, 12:47 PM Pager: Pager# [phone]

## 2017-03-03 NOTE — Progress Notes (Signed)
Progress Note  Patient Name: Jennifer Bauer Date of Encounter: 03/03/2017  Primary Cardiologist: Dr Elease Hashimoto  Subjective   Breathing improved but still sob with minimal exertion and palps when up and about   Inpatient Medications    Scheduled Meds: . amiodarone  150 mg Intravenous Once  . citalopram  20 mg Oral Daily  . diclofenac sodium  4 g Topical QID  . furosemide  60 mg Intravenous BID  . hydrocerin   Topical Daily  . metoprolol tartrate  25 mg Oral BID  . pantoprazole (PROTONIX) IV  40 mg Intravenous QHS  . rivaroxaban  20 mg Oral Q supper  . sucralfate  1 g Oral BID AC  . traMADol  100 mg Oral BID   Continuous Infusions: . amiodarone 30 mg/hr (03/03/17 0106)   PRN Meds: calcium carbonate, polyethylene glycol   Vital Signs    Vitals:   03/03/17 0422 03/03/17 0500 03/03/17 0600 03/03/17 0808  BP: (!) 118/91 112/84 (!) 127/95 106/69  Pulse: (!) 103 (!) 114 93 (!) 117  Resp: (!) 24     Temp: 98.1 F (36.7 C)   97.8 F (36.6 C)  TempSrc: Oral   Oral  SpO2: 95% 91% 91% 94%  Weight: 264 lb 1.8 oz (119.8 kg)     Height:        Intake/Output Summary (Last 24 hours) at 03/03/17 1046 Last data filed at 03/03/17 0600  Gross per 24 hour  Intake            567.4 ml  Output              650 ml  Net            -82.6 ml   But daily chart shows net 1L    Filed Weights   03/01/17 0400 03/02/17 0500 03/03/17 0422  Weight: 268 lb 8.3 oz (121.8 kg) 266 lb 12.1 oz (121 kg) 264 lb 1.8 oz (119.8 kg)   Telemetry     Afib with RVR no pauses Personally reviewed    Physical Exam   Well developed and nourished in no acute distress HENT normal Neck supple with JVP-10 Carotids brisk   Clear Rapid and irregular rate and rhythm, 2/6 systolic m Abd-soft with active BS without hepatomegaly No Clubbing cyanosis 1-2+ edema Skin-warm and dry A & Oriented  Grossly normal sensory and motor function   Labs    Chemistry  Recent Labs Lab 02/26/17 0356  02/28/17 0228  03/01/17 0549 03/02/17 0657 03/03/17 0150  NA 137  < > 135 134* 136 137  K 3.7  < > 4.0 4.2 3.3* 4.3  CL 101  < > 99* 98* 95* 98*  CO2 29  < > 32  GLUCOSE 125*  < > 122* 133* 90 110*  BUN 17  < > 23* 25* 20 24*  CREATININE 0.93  < > 1.08* 1.06* 0.97 1.29*  CALCIUM 8.6*  < > 8.7* 8.8* 8.6* 8.8*  PROT 6.6  --  6.4* 6.5  --   --   ALBUMIN 3.0*  --  3.1* 3.0*  --   --   AST 42*  --  36 39  --   --   ALT 73*  --  59* 59*  --   --   ALKPHOS 92  --  93 95  --   --   BILITOT 1.5*  --  1.9* 1.6*  --   --  GFRNONAA >60  < > 53* 54* >60 43*  GFRAA >60  < > >60 >60 >60 50*  ANIONGAP 7  < > < > = values in this interval not displayed.   Hematology  Recent Labs Lab 02/28/17 0228 03/01/17 0549 03/03/17 0150  WBC 5.6 7.1 5.0  RBC 4.00 4.16 4.09  HGB 8.8* 9.1* 8.9*  HCT 30.1* 31.2* 30.6*  MCV 75.3* 75.0* 74.8*  MCH 22.0* 21.9* 21.8*  MCHC 29.2* 29.2* 29.1*  RDW 15.6* 15.7* 16.0*  PLT 183 191 161    Cardiac EnzymesNo results for input(s): TROPONINI in the last 168 hours.  No results for input(s): TROPIPOC in the last 168 hours.   BNP No results for input(s): BNP, PROBNP in the last 168 hours.   DDimer No results for input(s): DDIMER in the last 168 hours.   Radiology    Dg Chest Port 1 View  Result Date: 03/01/2017 CLINICAL DATA:  Shortness of breath. EXAM: PORTABLE CHEST 1 VIEW COMPARISON:  Radiographs February 27, 2017. FINDINGS: Stable cardiomegaly. Sternotomy wires are noted. No pneumothorax or significant pleural effusion is noted. Stable right basilar atelectasis or infiltrate is noted. Stable left midlung scarring is noted. Stable calcified granuloma is noted in left upper lobe. Sternotomy wires are noted. Bony thorax is unremarkable. IMPRESSION: Stable right basilar opacity consistent with atelectasis or infiltrate. Electronically Signed   By: Lupita Raider, M.D.   On: 03/01/2017 12:24    Cardiac Studies   TTE: 02/23/17 - Left ventricle: The  cavity size was normal. Wall thickness was   increased in a pattern of moderate LVH. Systolic function was   normal. The estimated ejection fraction was in the range of 55%   to 60%. Wall motion was normal; there were no regional wall   motion abnormalities. - Aortic valve: A bioprosthesis was present. There was moderate   stenosis. Valve area (VTI): 0.57 cm^2. Valve area (Vmax): 0.68   cm^2. Valve area (Vmean): 0.62 cm^2. - Mitral valve: Mildly calcified annulus. There was mild   regurgitation. - Left atrium: The atrium was moderately dilated. - Tricuspid valve: There was mild-moderate regurgitation. - Pulmonary arteries: Systolic pressure was moderately increased.   PA peak pressure: 55 mm Hg (S).    Patient Profile     Jennifer Bauer is a 66 y.o.largely wheel chair bound female with a hx of HTN, AoV dz (s/p AVR 2012), atrial fib (s/p DCCV), bradycardia/hypotension on higher doses of CCB, LE cellulitis, and hypokalemia  seen t9/6 for the evaluation of a/c dCHF and atrial fib RVR.   9/10 DCCV with reversion (5 min) to AFib;  amio initiated,  Low dose BB continued    Assessment & Plan   Atrial fibrillation with RVR - failed cardioversion  Now on amio  Anticipate repeat DCCV early next week  Acute on chronic diastolic heart failure   Hypokalemia replete 9/15  Prosthetic aortic valve    Continue and make more aggressive her diuresis  Will continue amio--change to PO  Follow K   Anticiapte DCCV next week      For questions or updates, please contact [facility] Please consult [email or web address] for contact info under Cardiology/STEMI. Daytime calls, contact the Day Call APP (6a-8a) or assigned team (Teams A-D) provider (7:30a - 5p). All other daytime calls (7:30-5p), contact the Card Master @ [phone].   Nighttime calls, contact the assigned APP (5p-8p) or MD (6:30p-8p). Overnight calls (8p-6a), contact the on call Fellow @  [phone].     Signed, Sherryl Manges,  MD  03/03/2017, 10:39 AM

## 2017-03-03 NOTE — Clinical Social Work Note (Signed)
Per MD note, pt not medically ready for discharge at this time. CSW continuing to follow for pt support and discharge needs.   Corlis Hove, LCSWA, LCASA Clinical Social Work [facility] Coverage) [phone]

## 2017-03-04 LAB — BASIC METABOLIC PANEL
Anion gap: 7 (ref 5–15)
BUN: 27 mg/dL — AB (ref 6–20)
CALCIUM: 8.7 mg/dL — AB (ref 8.9–10.3)
CO2: 32 mmol/L (ref 22–32)
Chloride: 99 mmol/L — ABNORMAL LOW (ref 101–111)
Creatinine, Ser: 1.31 mg/dL — ABNORMAL HIGH (ref 0.44–1.00)
GFR calc Af Amer: 49 mL/min — ABNORMAL LOW (ref >60)
GFR, EST NON AFRICAN AMERICAN: 42 mL/min — AB (ref >60)
Glucose, Bld: 111 mg/dL — ABNORMAL HIGH (ref 65–99)
POTASSIUM: 3.1 mmol/L — AB (ref 3.5–5.1)
SODIUM: 138 mmol/L (ref 135–145)

## 2017-03-04 MED ORDER — POTASSIUM CHLORIDE CRYS ER 20 MEQ PO TBCR
40.0000 meq | EXTENDED_RELEASE_TABLET | Freq: Once | ORAL | Status: AC
  Administered 2017-03-04: 40 meq via ORAL
  Filled 2017-03-04: qty 2

## 2017-03-04 MED ORDER — METOPROLOL TARTRATE 50 MG PO TABS
50.0000 mg | ORAL_TABLET | Freq: Two times a day (BID) | ORAL | Status: DC
  Administered 2017-03-04 – 2017-03-08 (×8): 50 mg via ORAL
  Filled 2017-03-04 (×8): qty 1

## 2017-03-04 MED ORDER — FUROSEMIDE 10 MG/ML IJ SOLN
60.0000 mg | Freq: Two times a day (BID) | INTRAMUSCULAR | Status: DC
  Administered 2017-03-04 – 2017-03-07 (×8): 60 mg via INTRAVENOUS
  Filled 2017-03-04 (×9): qty 6

## 2017-03-04 MED ORDER — POTASSIUM CHLORIDE 10 MEQ/100ML IV SOLN
10.0000 meq | INTRAVENOUS | Status: AC
  Administered 2017-03-04 (×6): 10 meq via INTRAVENOUS
  Filled 2017-03-04 (×6): qty 100

## 2017-03-04 NOTE — Progress Notes (Signed)
Progress Note  Patient Name: Jennifer Bauer Date of Encounter: 03/04/2017  Primary Cardiologist: Dr Elease Hashimoto  Subjective   Breathing is a little better Up in a chair Epigastric pain - on PPI  Thought 2/2 K now IV   Inpatient Medications    Scheduled Meds: . amiodarone  150 mg Intravenous Once  . amiodarone  400 mg Oral Q8H  . citalopram  20 mg Oral Daily  . diclofenac sodium  4 g Topical QID  . furosemide  60 mg Intravenous BID  . hydrocerin   Topical Daily  . metoprolol tartrate  25 mg Oral BID  . pantoprazole (PROTONIX) IV  40 mg Intravenous QHS  . rivaroxaban  20 mg Oral Q supper  . sucralfate  1 g Oral BID AC  . traMADol  100 mg Oral BID   Continuous Infusions: . potassium chloride 10 mEq (03/04/17 0932)   PRN Meds: calcium carbonate, polyethylene glycol   Vital Signs    Vitals:   03/03/17 2100 03/03/17 2257 03/04/17 0316 03/04/17 0800  BP: 96/69 107/86 114/87 113/86  Pulse: 88 (!) 118 (!) 118 (!) 140  Resp: (!) 23 (!) 24 18 (!) 22  Temp:  (!) 97.4 F (36.3 C) 98.7 F (37.1 C) 98.4 F (36.9 C)  TempSrc:  Oral Oral Oral  SpO2: 91% 93% 96% 98%  Weight:   267 lb 3.2 oz (121.2 kg)   Height:        Intake/Output Summary (Last 24 hours) at 03/04/17 1031 Last data filed at 03/04/17 0657  Gross per 24 hour  Intake           227.99 ml  Output             1175 ml  Net          -947.01 ml   But daily chart shows net 1L    Filed Weights   03/02/17 0500 03/03/17 0422 03/04/17 0316  Weight: 266 lb 12.1 oz (121 kg) 264 lb 1.8 oz (119.8 kg) 267 lb 3.2 oz (121.2 kg)   Telemetry    Afib with RVR Personally reviewed    Physical Exam   Well developed and nourished in no acute distress HENT normal Neck supple with JVP-9-10 Carotids brisk   Clear Rapid and irregular rate and rhythm, 2/6 systolic m Abd-soft with active BS without hepatomegaly No Clubbing cyanosis 1+ edema Skin-warm and dry A & Oriented  Grossly normal sensory and motor function   Labs     Chemistry  Recent Labs Lab 02/26/17 0356  02/28/17 0228 03/01/17 0549 03/02/17 0657 03/03/17 0150 03/04/17 0406  NA 137  < > 135 134* 136 137 138  K 3.7  < > 4.0 4.2 3.3* 4.3 3.1*  CL 101  < > 99* 98* 95* 98* 99*  CO2 29  < > 32 32  GLUCOSE 125*  < > 122* 133* 90 110* 111*  BUN 17  < > 23* 25* 20 24* 27*  CREATININE 0.93  < > 1.08* 1.06* 0.97 1.29* 1.31*  CALCIUM 8.6*  < > 8.7* 8.8* 8.6* 8.8* 8.7*  PROT 6.6  --  6.4* 6.5  --   --   --   ALBUMIN 3.0*  --  3.1* 3.0*  --   --   --   AST 42*  --  36 39  --   --   --   ALT 73*  --  59* 59*  --   --   --  ALKPHOS 92  --  93 95  --   --   --   BILITOT 1.5*  --  1.9* 1.6*  --   --   --   GFRNONAA >60  < > 53* 54* >60 43* 42*  GFRAA >60  < > >60 >60 >60 50* 49*  ANIONGAP 7  < > < > = values in this interval not displayed.   Hematology  Recent Labs Lab 02/28/17 0228 03/01/17 0549 03/03/17 0150  WBC 5.6 7.1 5.0  RBC 4.00 4.16 4.09  HGB 8.8* 9.1* 8.9*  HCT 30.1* 31.2* 30.6*  MCV 75.3* 75.0* 74.8*  MCH 22.0* 21.9* 21.8*  MCHC 29.2* 29.2* 29.1*  RDW 15.6* 15.7* 16.0*  PLT 183 191 161    Cardiac EnzymesNo results for input(s): TROPONINI in the last 168 hours.  No results for input(s): TROPIPOC in the last 168 hours.   BNP No results for input(s): BNP, PROBNP in the last 168 hours.   DDimer No results for input(s): DDIMER in the last 168 hours.   Radiology    No results found.  Cardiac Studies   TTE: 02/23/17 - Left ventricle: The cavity size was normal. Wall thickness was   increased in a pattern of moderate LVH. Systolic function was   normal. The estimated ejection fraction was in the range of 55%   to 60%. Wall motion was normal; there were no regional wall   motion abnormalities. - Aortic valve: A bioprosthesis was present. There was moderate   stenosis. Valve area (VTI): 0.57 cm^2. Valve area (Vmax): 0.68   cm^2. Valve area (Vmean): 0.62 cm^2. - Mitral valve: Mildly calcified  annulus. There was mild   regurgitation. - Left atrium: The atrium was moderately dilated. - Tricuspid valve: There was mild-moderate regurgitation. - Pulmonary arteries: Systolic pressure was moderately increased.   PA peak pressure: 55 mm Hg (S).    Patient Profile     Jennifer Bauer is a 66 y.o.largely wheel chair bound female with a hx of HTN, AoV dz (s/p AVR 2012), atrial fib (s/p DCCV), bradycardia/hypotension on higher doses of CCB, LE cellulitis, and hypokalemia  seen t9/6 for the evaluation of a/c dCHF and atrial fib RVR.   9/10 DCCV with reversion (5 min) to AFib;  amio initiated,  Low dose BB continued    Assessment & Plan  Atrial fibrillation with RVR - failed cardioversion  Now on amio po   Acute on chronic diastolic heart failure   Hypokalemia persisting  Prosthetic aortic valve  Abd pain  Per primary service  Anticipate repeat DCCV early next week-- in the interim, I think there is little we can do , or need to worry about with tachycardia -- BP a little better so will increase metop >>50 bid   Fluid status a little better  Continue diuresis    For questions or updates, please contact [facility] Please consult [email or web address] for contact info under Cardiology/STEMI. Daytime calls, contact the Day Call APP (6a-8a) or assigned team (Teams A-D) provider (7:30a - 5p). All other daytime calls (7:30-5p), contact the Card Master @ [phone].   Nighttime calls, contact the assigned APP (5p-8p) or MD (6:30p-8p). Overnight calls (8p-6a), contact the on call Fellow @ [phone].     Signed, Sherryl Manges, MD  03/04/2017, 10:31 AM

## 2017-03-04 NOTE — Progress Notes (Signed)
   Subjective: No acute event overnight.she continued to experience intermittent epigastric pain, denies any relationship with food. She was complaining of palpitations whenever she move around a little bit. Denies any chest pain.  Objective:  Vital signs in last 24 hours: Vitals:   03/03/17 2100 03/03/17 2257 03/04/17 0316 03/04/17 0800  BP: 96/69 107/86 114/87 113/86  Pulse: 88 (!) 118 (!) 118 (!) 140  Resp: (!) 23 (!) 24 18 (!) 22  Temp:  (!) 97.4 F (36.3 C) 98.7 F (37.1 C) 98.4 F (36.9 C)  TempSrc:  Oral Oral Oral  SpO2: 91% 93% 96% 98%  Weight:   267 lb 3.2 oz (121.2 kg)   Height:       Gen.well-developed, well-nourished lady, sitting comfortably in chair, in no acute distress. Chest. Clear bilaterally. CV. Irregularly irregular with tachycardia. Abdomen. Soft, mild epigastric tenderness, bowel sounds positive. Extremities.2+ bilateral lower extremity edema up to thighs, lower extremity is wrapped.   Assessment/Plan:  Acute on chronic diastolic CHF (congestive heart failure), NYHA class 4 (HCC).she continued to have lower extremity edema with very little improvement, weight is up today about 3 pound, creatinine is worsening,her net output was -909,with total of -5 L. Worsening of heart failure most likely due to continuous A. Fib with RVR. Increase in creatinine can be partly due to hypoxia due to decreased blood flow to the kidneys in the setting of A. Fib with RVR. -decrease her Lasix to 60 mg IV twice a day. -continue monitoring with  intake and output and daily weights.  Atrial fibrillation with rapid ventricular response (HCC). Patient remained in A. Fib with RVR, she is having more tachycardia with the start of by mouth amiodarone and decrease in her metoprolol by cardiology. We appreciate cardiology recommendations. -plan is to try another cardioversion early this week. -Continue Xarelto. -[place] Vasc score of 2 with 4% risk  Hypokalemia: potassium was 3.1  today. -Replace with IV Kcl 60 mg-we will avoid by mouth potassium as it worsened her epigastric pain.  AKI:  Mild worsening of creatinine to 1.31 today. -Decreased her Lasix to 60 mg IV twice a day. -Continue monitoring renal function.  Mid-Epigstric pain and costovertebral pain: she had this nonspecific chronic pain. Seems little improved today. It looked like in the past that it get worse with by mouth potassium. Voltaren gel continued to help with rib pain. -continue Voltaren gel   Microcytic anemia. Hb =8.9 &MCV=74.8 today,Her baseline Hgb has been around 9-11 recently. Seems to be chronic.  -Will repeat CBC asindicated  -Follow up in clinic for probableiron deficiency anemia -Given the patients GI upset, will hold PO iron at this time.   Dispo: Anticipated discharge in approximately 2-3 day(s).   Arnetha Courser, MD 03/04/2017, 9:42 AM Pager: [phone]

## 2017-03-05 LAB — BASIC METABOLIC PANEL
ANION GAP: 8 (ref 5–15)
BUN: 26 mg/dL — ABNORMAL HIGH (ref 6–20)
CALCIUM: 9 mg/dL (ref 8.9–10.3)
CHLORIDE: 97 mmol/L — AB (ref 101–111)
CO2: 31 mmol/L (ref 22–32)
Creatinine, Ser: 1.19 mg/dL — ABNORMAL HIGH (ref 0.44–1.00)
GFR calc non Af Amer: 47 mL/min — ABNORMAL LOW (ref >60)
GFR, EST AFRICAN AMERICAN: 55 mL/min — AB (ref >60)
Glucose, Bld: 106 mg/dL — ABNORMAL HIGH (ref 65–99)
Potassium: 4.2 mmol/L (ref 3.5–5.1)
SODIUM: 136 mmol/L (ref 135–145)

## 2017-03-05 LAB — CBC
HEMATOCRIT: 31.9 % — AB (ref 36.0–46.0)
Hemoglobin: 9.1 g/dL — ABNORMAL LOW (ref 12.0–15.0)
MCH: 21.2 pg — ABNORMAL LOW (ref 26.0–34.0)
MCHC: 28.5 g/dL — ABNORMAL LOW (ref 30.0–36.0)
MCV: 74.2 fL — ABNORMAL LOW (ref 78.0–100.0)
Platelets: 159 10*3/uL (ref 150–400)
RBC: 4.3 MIL/uL (ref 3.87–5.11)
RDW: 16.1 % — AB (ref 11.5–15.5)
WBC: 5.8 10*3/uL (ref 4.0–10.5)

## 2017-03-05 MED ORDER — SODIUM CHLORIDE 0.9 % IV SOLN
250.0000 mL | INTRAVENOUS | Status: DC
  Administered 2017-03-06: 250 mL via INTRAVENOUS

## 2017-03-05 MED ORDER — SODIUM CHLORIDE 0.9% FLUSH: 3.0000 mL | INTRAVENOUS | Status: DC | PRN

## 2017-03-05 MED ORDER — SODIUM CHLORIDE 0.9% FLUSH
3.0000 mL | Freq: Two times a day (BID) | INTRAVENOUS | Status: DC
  Administered 2017-03-05 – 2017-03-09 (×7): 3 mL via INTRAVENOUS

## 2017-03-05 NOTE — Clinical Social Work Note (Signed)
CSW continues to follow for discharge needs.  Addysin Porco, CSW [phone]  

## 2017-03-05 NOTE — Care Management (Signed)
CM received LTACH referral however pt does not have LTACH benefits with current insurance.  Discharge plan continues to be SNF

## 2017-03-05 NOTE — Progress Notes (Signed)
Progress Note  Patient Name: Jennifer Bauer Date of Encounter: 03/05/2017  Primary Cardiologist: Elease Hashimoto   Subjective   Jennifer Bauer is a 66 year old female with a history of acute on chronic diastolic congestive heart failure, aortic valve replacement and recurrent atrial fibrillation. She had a cardioversion during this admission but has converted back into atrial fib. She has been on amiodarone.  Echocardiogram performed this admission reveals normal left ventricular systolic function with ejection fraction of 55-60%.  There is a prosthetic aortic valve with a mean aortic valve gradient of 35 mmHg.  She has moderate pulmonary hypertension with an estimated PA pressure of 55 mmHg.     She has diuresed 5 L during this hospitalization. She seems to be breathing a little bit better. She slept in the chair last night.   Inpatient Medications    Scheduled Meds: . amiodarone  150 mg Intravenous Once  . amiodarone  400 mg Oral Q8H  . citalopram  20 mg Oral Daily  . diclofenac sodium  4 g Topical QID  . furosemide  60 mg Intravenous BID  . hydrocerin   Topical Daily  . metoprolol tartrate  50 mg Oral BID  . pantoprazole (PROTONIX) IV  40 mg Intravenous QHS  . rivaroxaban  20 mg Oral Q supper  . sucralfate  1 g Oral BID AC  . traMADol  100 mg Oral BID   Continuous Infusions:  PRN Meds: calcium carbonate, polyethylene glycol   Vital Signs    Vitals:   03/05/17 0000 03/05/17 0115 03/05/17 0400 03/05/17 0645  BP: 110/90 115/84 109/65   Pulse: 99 99 74   Resp: 20 (!) 36 (!) 24   Temp:  98.1 F (36.7 C) 97.9 F (36.6 C)   TempSrc:  Oral Oral   SpO2: 94% 93% 94%   Weight:    265 lb 14 oz (120.6 kg)  Height:        Intake/Output Summary (Last 24 hours) at 03/05/17 0802 Last data filed at 03/05/17 6045  Gross per 24 hour  Intake              740 ml  Output              500 ml  Net              240 ml   Filed Weights   03/03/17 0422 03/04/17 0316 03/05/17 0645  Weight:  264 lb 1.8 oz (119.8 kg) 267 lb 3.2 oz (121.2 kg) 265 lb 14 oz (120.6 kg)    Telemetry    Atrial fib with RVR  - Personally Reviewed  ECG     atrial fib  - Personally Reviewed  Physical Exam   GEN:  Middle-age female in no acute respiratory distress Neck:  mild JVD Cardiac:  irregularly irregular. Heart rate is tachycardic. She has a 2/6 systolic murmur at the left sternal border. Respiratory:  she has rales bilaterally in the bases.Marland Kitchen GI: Soft, nontender, non-distended  MS:  she has 1-2+ pitting edema. Her legs are wrapped with an ace wrap. Neuro:  Nonfocal  Psych:  Difficult to assess.  Labs    Chemistry Recent Labs Lab 02/28/17 0228 03/01/17 0549  03/03/17 0150 03/04/17 0406 03/05/17 0144  NA 135 134*  < > 137 138 136  K 4.0 4.2  < > 4.3 3.1* 4.2  CL 99* 98*  < > 98* 99* 97*  CO2 29 28  < > 32 32 31  GLUCOSE  122* 133*  < > 110* 111* 106*  BUN 23* 25*  < > 24* 27* 26*  CREATININE 1.08* 1.06*  < > 1.29* 1.31* 1.19*  CALCIUM 8.7* 8.8*  < > 8.8* 8.7* 9.0  PROT 6.4* 6.5  --   --   --   --   ALBUMIN 3.1* 3.0*  --   --   --   --   AST 36 39  --   --   --   --   ALT 59* 59*  --   --   --   --   ALKPHOS 93 95  --   --   --   --   BILITOT 1.9* 1.6*  --   --   --   --   GFRNONAA 53* 54*  < > 43* 42* 47*  GFRAA >60 >60  < > 50* 49* 55*  ANIONGAP 7 8  < > < > = values in this interval not displayed.   Hematology Recent Labs Lab 03/01/17 0549 03/03/17 0150 03/05/17 0144  WBC 7.1 5.0 5.8  RBC 4.16 4.09 4.30  HGB 9.1* 8.9* 9.1*  HCT 31.2* 30.6* 31.9*  MCV 75.0* 74.8* 74.2*  MCH 21.9* 21.8* 21.2*  MCHC 29.2* 29.1* 28.5*  RDW 15.7* 16.0* 16.1*  PLT 191 161 159    Cardiac EnzymesNo results for input(s): TROPONINI in the last 168 hours. No results for input(s): TROPIPOC in the last 168 hours.   BNPNo results for input(s): BNP, PROBNP in the last 168 hours.   DDimer No results for input(s): DDIMER in the last 168 hours.   Radiology    No results  found.  Cardiac Studies     Patient Profile     66 y.o. female with acute on chronic diastolic congestive heart failure, recurrent atrial fibrillation, moderate prosthetic aortic stenosis  Assessment & Plan    1. Atrial fibrillation: She's been on amiodarone. We will consider repeat cardioversion this week.  We'll see if we can get it scheduled tomorrow. Continue Xarelto..  2. Acute on chronic diastolic congestive heart failure: This is been on ongoing challenge. This is competent by her rapid atrial fibrillation and her prosthetic aortic valve stenosis which is now moderate. Continue diuresis with Lasix 60 mg IV twice a day.  3. Prosthetic aortic valve stenosis: Her aortic valve gradient has worsened since last year. I do not think that it is severe enough to consider TAVR.   I do not think that she is a good candidate for standard redo aortic valve surgery given her general poor condition and the fact that this would be redo surgery.  For questions or updates, please contact [facility] Please consult [email or web address] for contact info under Cardiology/STEMI.      Signed, Kristeen Miss, MD  03/05/2017, 8:02 AM

## 2017-03-05 NOTE — Progress Notes (Signed)
   Subjective: Patient was sitting up in her chair watching TV after having finished breakfast today. She denied concerns stating that her chest and abdominal pain were much better today. She is aware that we are treating her rapid heart rate with irregular rhythm as well as her fluid overload. Patient denied worsening chest pain, abdominal pain, nausea, vomiting or fever.   Objective:  Vital signs in last 24 hours: Vitals:   03/05/17 0800 03/05/17 1014 03/05/17 1102 03/05/17 1200  BP: 105/83 106/85 93/68   Pulse:  (!) 112 94   Resp:   (!) 25   Temp: 98.2 F (36.8 C)  98.5 F (36.9 C) (!) 97.5 F (36.4 C)  TempSrc: Oral  Oral   SpO2:   93%   Weight:      Height:       ROS negative except as per HPI.  Physical Exam  Constitutional: She appears well-developed and well-nourished. No distress.  HENT:  Head: Normocephalic and atraumatic.  Cardiovascular: Normal rate and regular rhythm.   Pulmonary/Chest: Effort normal and breath sounds normal. No respiratory distress.  Abdominal: Soft. Bowel sounds are normal.  Musculoskeletal: She exhibits edema and tenderness.  Neurological: She is alert.  Skin: Skin is warm. She is not diaphoretic.  Psychiatric: She has a normal mood and affect.    Assessment/Plan:  Principal Problem:   Acute on chronic diastolic CHF (congestive heart failure), NYHA class 4 (HCC) Active Problems:   S/P AVR (aortic valve replacement)   Atrial fibrillation with rapid ventricular response (HCC)   Physical deconditioning   Multiple blisters both lower legs   Chronic anticoagulation   Atrial fibrillation with RVR (HCC)   Sternocostal pain  A-fib with RVR: Continue Xarelto Continue Amiodarone  PO q8hr -Cards note reads they are planning on second attempt at cardioversion this week, hopefully by Tuesday.   Acute on Chronic diastolic CHF: Continue diuresis with Lasix  IV twice a day Patient continues to diurese daily Weight stable, but edema has  decreased clinically  Hypokalemia: Resolved today Will replaced again in am with IV -CMP in am  Physical deconditioning: PT consult placed.  Epigastric pain: improved -Please do not use PO Potassium as this greatly worsens the patient pain Continue PPI  Renal Function: Creatinine increased following  lasix PO BID Decreased to  IV lasix BID Cr down today to 1.19 from 1.31  Transaminates:  Resolved Will repeat CMP in am to evaluate given possible jaundice appearance today  Diet: Heart healthy Fluids: none DVT ppx: xarelto  Code: full Dispo: Anticipated discharge in approximately 2-3 day(s).   Lanelle Bal, MD 03/05/2017, 1:45 PM Pager: Pager# [phone]

## 2017-03-06 ENCOUNTER — Encounter (HOSPITAL_COMMUNITY): Payer: Self-pay | Admitting: *Deleted

## 2017-03-06 ENCOUNTER — Inpatient Hospital Stay (HOSPITAL_COMMUNITY): Payer: Medicaid Other | Admitting: Certified Registered Nurse Anesthetist

## 2017-03-06 ENCOUNTER — Encounter (HOSPITAL_COMMUNITY)
Admission: EM | Disposition: A | Discharge status: Skilled Nursing Facility | Payer: Self-pay | Source: Home / Self Care | Attending: Internal Medicine

## 2017-03-06 HISTORY — PX: CARDIOVERSION: SHX1299

## 2017-03-06 LAB — COMPREHENSIVE METABOLIC PANEL
ALBUMIN: 2.9 g/dL — AB (ref 3.5–5.0)
ALK PHOS: 83 U/L (ref 38–126)
ALT: 27 U/L (ref 14–54)
AST: 19 U/L (ref 15–41)
Anion gap: 7 (ref 5–15)
BILIRUBIN TOTAL: 2 mg/dL — AB (ref 0.3–1.2)
BUN: 27 mg/dL — AB (ref 6–20)
CALCIUM: 8.8 mg/dL — AB (ref 8.9–10.3)
CO2: 32 mmol/L (ref 22–32)
Chloride: 98 mmol/L — ABNORMAL LOW (ref 101–111)
Creatinine, Ser: 1.33 mg/dL — ABNORMAL HIGH (ref 0.44–1.00)
GFR calc Af Amer: 48 mL/min — ABNORMAL LOW (ref >60)
GFR calc non Af Amer: 41 mL/min — ABNORMAL LOW (ref >60)
GLUCOSE: 106 mg/dL — AB (ref 65–99)
Potassium: 3.6 mmol/L (ref 3.5–5.1)
Sodium: 137 mmol/L (ref 135–145)
TOTAL PROTEIN: 6 g/dL — AB (ref 6.5–8.1)

## 2017-03-06 LAB — CBC
HCT: 31.3 % — ABNORMAL LOW (ref 36.0–46.0)
Hemoglobin: 8.9 g/dL — ABNORMAL LOW (ref 12.0–15.0)
MCH: 21.2 pg — ABNORMAL LOW (ref 26.0–34.0)
MCHC: 28.4 g/dL — AB (ref 30.0–36.0)
MCV: 74.5 fL — ABNORMAL LOW (ref 78.0–100.0)
PLATELETS: 139 10*3/uL — AB (ref 150–400)
RBC: 4.2 MIL/uL (ref 3.87–5.11)
RDW: 16.2 % — AB (ref 11.5–15.5)
WBC: 4.9 10*3/uL (ref 4.0–10.5)

## 2017-03-06 SURGERY — CARDIOVERSION
Anesthesia: General

## 2017-03-06 MED ORDER — POTASSIUM CHLORIDE 10 MEQ/100ML IV SOLN
INTRAVENOUS | Status: AC
  Filled 2017-03-06: qty 100

## 2017-03-06 MED ORDER — PROPOFOL 10 MG/ML IV BOLUS
INTRAVENOUS | Status: DC | PRN
  Administered 2017-03-06: 70 mg via INTRAVENOUS

## 2017-03-06 MED ORDER — POTASSIUM CHLORIDE 10 MEQ/100ML IV SOLN
10.0000 meq | INTRAVENOUS | Status: AC
  Administered 2017-03-06 (×4): 10 meq via INTRAVENOUS
  Filled 2017-03-06 (×3): qty 100

## 2017-03-06 NOTE — Transfer of Care (Signed)
Immediate Anesthesia Transfer of Care Note  Patient: Jennifer Bauer  Procedure(s) Performed: Procedure(s): CARDIOVERSION (N/A)  Patient Location: Endoscopy Unit  Anesthesia Type:General  Level of Consciousness: drowsy, patient cooperative and responds to stimulation  Airway & Oxygen Therapy: Patient Spontanous Breathing and Patient connected to nasal cannula oxygen  Post-op Assessment: Report given to RN and Post -op Vital signs reviewed and stable  Post vital signs: Reviewed and stable  Last Vitals:  Vitals:   03/06/17 0931 03/06/17 1210  BP: 100/80 93/72  Pulse: 91 91  Resp:  (!) 22  Temp:  (!) 36.4 C  SpO2:  98%    Last Pain:  Vitals:   03/06/17 1210  TempSrc: Oral  PainSc:       Patients Stated Pain Goal: 1 (03/05/17 0800)  Complications: No apparent anesthesia complications

## 2017-03-06 NOTE — Evaluation (Addendum)
Physical Therapy Evaluation Patient Details Name: Jennifer Bauer MRN: [id number] DOB: 1951-03-12 Today's Date: 03/06/2017   History of Present Illness  66 year old female with a history of acute on chronic diastolic congestive heart failure, aortic valve replacement, and recurrent atrial fibrillation. She has been restarted on amiodarone. She failed card eversion last week and is scheduled today for repeat cardioversion.  Plan for second cardioversion today.  Clinical Impression  Pt admitted with above diagnosis. Pt currently with functional limitations due to the deficits listed below (see PT Problem List). Pt was able to stand to RW but only to march in place.  Was Independent with wheelchair transfers PTA.  Pt wants to return to [facility] in [place] where she has been with her sister.  Will follow acutely.  Pt will benefit from skilled PT to increase their independence and safety with mobility to allow discharge to the venue listed below.      Follow Up Recommendations SNF;Supervision/Assistance - 24 hour    Equipment Recommendations  None recommended by PT    Recommendations for Other Services       Precautions / Restrictions Precautions Precautions: Fall Restrictions Weight Bearing Restrictions: No      Mobility  Bed Mobility               General bed mobility comments: NT in chair  Transfers Overall transfer level: Needs assistance Equipment used: Rolling walker (2 wheeled) Transfers: Sit to/from Stand Sit to Stand: Min guard;Min assist         General transfer comment: Needed assist to power up and steadying assist to stay up.  Pt "gives out in knees" per pt.  Ablet o march in place only.   Ambulation/Gait             General Gait Details: unable  Stairs            Wheelchair Mobility    Modified Rankin (Stroke Patients Only)       Balance Overall balance assessment: Needs assistance;History of Falls Sitting-balance  support: No upper extremity supported;Feet supported Sitting balance-Leahy Scale: Fair     Standing balance support: Bilateral upper extremity supported;During functional activity Standing balance-Leahy Scale: Poor Standing balance comment: relies on RW for balance                              Pertinent Vitals/Pain Pain Assessment: No/denies pain   VSS on 2LO2. BP did drop from 100/80 to 84/67 after standing but pt asymptomatic. Nurse made aware. Home Living Family/patient expects to be discharged to:: Skilled nursing facility                      Prior Function Level of Independence: Needs assistance   Gait / Transfers Assistance Needed: Independent with wheelchair transfers per pt  ADL's / Homemaking Assistance Needed: A by staff per pt report        Hand Dominance        Extremity/Trunk Assessment   Upper Extremity Assessment Upper Extremity Assessment: Defer to OT evaluation    Lower Extremity Assessment Lower Extremity Assessment: Generalized weakness    Cervical / Trunk Assessment Cervical / Trunk Assessment: Kyphotic  Communication   Communication: No difficulties  Cognition Arousal/Alertness: Awake/alert Behavior During Therapy: WFL for tasks assessed/performed Overall Cognitive Status: Within Functional Limits for tasks assessed  General Comments      Exercises General Exercises - Lower Extremity Ankle Circles/Pumps: AROM;Both;10 reps;Supine Long Arc Quad: AROM;Both;10 reps;Seated Hip Flexion/Marching: AROM;Both;10 reps;Seated;Standing   Assessment/Plan    PT Assessment Patient needs continued PT services  PT Problem List Decreased activity tolerance;Decreased balance;Decreased mobility;Decreased knowledge of use of DME;Decreased safety awareness;Decreased knowledge of precautions;Cardiopulmonary status limiting activity       PT Treatment Interventions DME  instruction;Gait training;Stair training;Functional mobility training;Therapeutic activities;Therapeutic exercise;Balance training;Patient/family education;Wheelchair mobility training    PT Goals (Current goals can be found in the Care Plan section)  Acute Rehab PT Goals Patient Stated Goal: to go to [facility] in [place] city PT Goal Formulation: With patient Time For Goal Achievement: 03/20/17 Potential to Achieve Goals: Good    Frequency Min 2X/week   Barriers to discharge Decreased caregiver support      Co-evaluation               AM-PAC PT "6 Clicks" Daily Activity  Outcome Measure Difficulty turning over in bed (including adjusting bedclothes, sheets and blankets)?: A Lot Difficulty moving from lying on back to sitting on the side of the bed? : A Lot Difficulty sitting down on and standing up from a chair with arms (e.g., wheelchair, bedside commode, etc,.)?: A Lot Help needed moving to and from a bed to chair (including a wheelchair)?: A Lot Help needed walking in hospital room?: Total Help needed climbing 3-5 steps with a railing? : Total 6 Click Score: 10    End of Session Equipment Utilized During Treatment: Gait belt;Oxygen Activity Tolerance: Patient limited by fatigue Patient left: in chair;with call bell/phone within reach Nurse Communication: Mobility status PT Visit Diagnosis: Muscle weakness (generalized) (M62.81);Other abnormalities of gait and mobility (R26.89);Unsteadiness on feet (R26.81)    Time: 0981-1914 PT Time Calculation (min) (ACUTE ONLY): 15 min   Charges:   PT Evaluation $PT Eval Moderate Complexity: 1 Mod     PT G Codes:        Jennifer Bauer,PT Acute Rehabilitation [phone] (pager)   Jennifer Bauer 03/06/2017, 12:03 PM

## 2017-03-06 NOTE — Anesthesia Postprocedure Evaluation (Signed)
Anesthesia Post Note  Patient: Jennifer Bauer  Procedure(s) Performed: Procedure(s) (LRB): CARDIOVERSION (N/A)     Patient location during evaluation: PACU Anesthesia Type: General Level of consciousness: awake and alert Pain management: pain level controlled Vital Signs Assessment: post-procedure vital signs reviewed and stable Respiratory status: spontaneous breathing, nonlabored ventilation, respiratory function stable and patient connected to nasal cannula oxygen Cardiovascular status: blood pressure returned to baseline and stable Postop Assessment: no apparent nausea or vomiting Anesthetic complications: no    Last Vitals:  Vitals:   03/06/17 1310 03/06/17 1332  BP: 92/62 (!) 82/56  Pulse: (!) 58 62  Resp: 20 (!) 22  Temp:  36.4 C  SpO2: 99% 100%    Last Pain:  Vitals:   03/06/17 1332  TempSrc: Oral  PainSc:                  Kennieth Rad

## 2017-03-06 NOTE — Anesthesia Preprocedure Evaluation (Signed)
Anesthesia Evaluation  Patient identified by MRN, date of birth, ID band Patient awake    Reviewed: Allergy & Precautions, NPO status , Patient's Chart, lab work & pertinent test results  Airway Mallampati: II  TM Distance: >3 FB Neck ROM: Full    Dental no notable dental hx.    Pulmonary neg pulmonary ROS,    Pulmonary exam normal breath sounds clear to auscultation       Cardiovascular hypertension, + dysrhythmias Atrial Fibrillation  Rhythm:Irregular Rate:Abnormal + Systolic murmurs Was admitted [facility] 06/18/16-06/28/16 for lower extremity celluslitis with severe edema. Treated with antibiotics. Hospital course complicated by A. fib RVR. Discussed with Dr. Willadean Carol and her beta blocker and calcium channel blocker increased. Planned for rate control and possible outpatient cardioversion. Echo during admission 06/26/16 showed EF 55-60%, mild- mod R/LAE, aortic sclerosis, mild to mod TR, G2DD, mild LVH and mild pulm HTN.   S/P AVR 2012    Neuro/Psych negative neurological ROS  negative psych ROS   GI/Hepatic negative GI ROS, Neg liver ROS,   Endo/Other  Morbid obesity  Renal/GU negative Renal ROS  negative genitourinary   Musculoskeletal negative musculoskeletal ROS (+)   Abdominal   Peds negative pediatric ROS (+)  Hematology negative hematology ROS (+)   Anesthesia Other Findings   Reproductive/Obstetrics negative OB ROS                             Anesthesia Physical  Anesthesia Plan  ASA: III  Anesthesia Plan: General   Post-op Pain Management:    Induction: Intravenous  PONV Risk Score and Plan: 2 and Ondansetron, Treatment may vary due to age or medical condition and Propofol infusion  Airway Management Planned: Mask and Natural Airway  Additional Equipment:   Intra-op Plan:   Post-operative Plan:   Informed Consent: I have reviewed the patients History  and Physical, chart, labs and discussed the procedure including the risks, benefits and alternatives for the proposed anesthesia with the patient or authorized representative who has indicated his/her understanding and acceptance.   Dental advisory given  Plan Discussed with: CRNA and Surgeon  Anesthesia Plan Comments:         Anesthesia Quick Evaluation

## 2017-03-06 NOTE — Progress Notes (Signed)
Progress Note  Patient Name: Jennifer Bauer Date of Encounter: 03/06/2017  Primary Cardiologist: Alford Gamero   Subjective   66 year old female with a history of acute on chronic diastolic congestive heart failure, aortic valve replacement, and recurrent atrial fibrillation. She has been restarted on amiodarone. She failed card eversion last week and is scheduled today for repeat cardioversion.  She feels quite well and is ready for cardioversion procedure. She is diuresed significant since last week.  Inpatient Medications    Scheduled Meds: . amiodarone  150 mg Intravenous Once  . amiodarone  400 mg Oral Q8H  . citalopram  20 mg Oral Daily  . diclofenac sodium  4 g Topical QID  . furosemide  60 mg Intravenous BID  . hydrocerin   Topical Daily  . metoprolol tartrate  50 mg Oral BID  . pantoprazole (PROTONIX) IV  40 mg Intravenous QHS  . rivaroxaban  20 mg Oral Q supper  . sodium chloride flush  3 mL Intravenous Q12H  . sucralfate  1 g Oral BID AC  . traMADol  100 mg Oral BID   Continuous Infusions: . sodium chloride 250 mL (03/06/17 0531)  . potassium chloride 10 mEq (03/06/17 1050)   PRN Meds: calcium carbonate, polyethylene glycol, sodium chloride flush   Vital Signs    Vitals:   03/06/17 0700 03/06/17 0715 03/06/17 0800 03/06/17 0931  BP:  104/76 90/69 100/80  Pulse:  100 98 91  Resp:  (!) 21 (!) 22   Temp: 98.4 F (36.9 C)  97.6 F (36.4 C)   TempSrc: Oral  Oral   SpO2:  99% 100%   Weight:      Height:        Intake/Output Summary (Last 24 hours) at 03/06/17 1147 Last data filed at 03/06/17 1100  Gross per 24 hour  Intake           543.48 ml  Output             1550 ml  Net         -1006.52 ml   Filed Weights   03/04/17 0316 03/05/17 0645 03/06/17 0413  Weight: 267 lb 3.2 oz (121.2 kg) 265 lb 14 oz (120.6 kg) 265 lb 3.4 oz (120.3 kg)    Telemetry    Atrial fib  - Personally Reviewed  ECG     atrial fib  - Personally Reviewed  Physical Exam    GEN: No acute distress.   Neck: No JVD Cardiac: Irreg. Irreg. , soft systolic murmur .  Respiratory: Clear to auscultation bilaterally. GI: Soft, nontender, non-distended  MS: No edema; No deformity. Neuro:  Nonfocal  Psych: Normal affect   Labs    Chemistry Recent Labs Lab 02/28/17 0228 03/01/17 0549  03/04/17 0406 03/05/17 0144 03/06/17 0244  NA 135 134*  < > 138 136 137  K 4.0 4.2  < > 3.1* 4.2 3.6  CL 99* 98*  < > 99* 97* 98*  CO2 29 28  < > 32 31 32  GLUCOSE 122* 133*  < > 111* 106* 106*  BUN 23* 25*  < > 27* 26* 27*  CREATININE 1.08* 1.06*  < > 1.31* 1.19* 1.33*  CALCIUM 8.7* 8.8*  < > 8.7* 9.0 8.8*  PROT 6.4* 6.5  --   --   --  6.0*  ALBUMIN 3.1* 3.0*  --   --   --  2.9*  AST 36 39  --   --   --  19  ALT 59* 59*  --   --   --  27  ALKPHOS 93 95  --   --   --  83  BILITOT 1.9* 1.6*  --   --   --  2.0*  GFRNONAA 53* 54*  < > 42* 47* 41*  GFRAA >60 >60  < > 49* 55* 48*  ANIONGAP 7 8  < > < > = values in this interval not displayed.   Hematology Recent Labs Lab 03/03/17 0150 03/05/17 0144 03/06/17 0244  WBC 5.0 5.8 4.9  RBC 4.09 4.30 4.20  HGB 8.9* 9.1* 8.9*  HCT 30.6* 31.9* 31.3*  MCV 74.8* 74.2* 74.5*  MCH 21.8* 21.2* 21.2*  MCHC 29.1* 28.5* 28.4*  RDW 16.0* 16.1* 16.2*  PLT 161 159 139*    Cardiac EnzymesNo results for input(s): TROPONINI in the last 168 hours. No results for input(s): TROPIPOC in the last 168 hours.   BNPNo results for input(s): BNP, PROBNP in the last 168 hours.   DDimer No results for input(s): DDIMER in the last 168 hours.   Radiology    No results found.  Cardiac Studies     Patient Profile     66 y.o. female with acute on chronic diastolic congestive heart. She has recurrent atrial fibrillation.  Assessment & Plan    1. Persistent atrial fibrillation: She's been on amiodarone. She is scheduled for cardioversion today. Continue Xarelto.  2.  Acute on chronic diastolic congestive heart failure. She has  been diuresed well. Continue current medications. Getting her back into sinus rhythm should help with this.  3. 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 I do not think that she is a very good candidate for redo valve surgery and  The valve is not severe enough to consider TAVR.    For questions or updates, please contact [facility] Please consult [email or web address] for contact info under Cardiology/STEMI.      Signed, Kristeen Miss, MD  03/06/2017, 11:47 AM

## 2017-03-06 NOTE — Progress Notes (Signed)
   Subjective: The patient was sitting up in her chair upon entering the room today watching "[employer]", her favorite show. She stated that she had slept well overnight, had been moved from the bed to the chair and that the abdominal pain was greatly improved. Patient stated that the sternocostal pain was somewhat more severe today. They had not placed the Voltaren gel over the area today. Patient was comfortable, denied shortness of breath or cough. Patient is aware of her current treatment regimen and that we are attempting to gain rate control and diuresing her.  Objective:  Vital signs in last 24 hours: Vitals:   03/05/17 2306 03/06/17 0413 03/06/17 0700 03/06/17 0715  BP: 123/76 101/76  104/76  Pulse: (!) 116 80  100  Resp: (!) 24 17  (!) 21  Temp: 97.9 F (36.6 C) 97.8 F (36.6 C) 98.4 F (36.9 C)   TempSrc: Oral Oral Oral   SpO2:  99%  99%  Weight:  265 lb 3.4 oz (120.3 kg)    Height:       ROS negative except as per HPI.  Physical Exam  Constitutional: She appears well-developed and well-nourished. No distress.  HENT:  Head: Normocephalic and atraumatic.  Cardiovascular: Normal rate and regular rhythm.   Pulmonary/Chest: Effort normal and breath sounds normal. No respiratory distress.  Abdominal: Soft. Bowel sounds are normal. She exhibits no distension. There is no tenderness.  Musculoskeletal: She exhibits edema and tenderness (Bilteral lower extremities. Does not appear significantly improved).  Neurological: She is alert.  Skin: She is not diaphoretic.    Assessment/Plan:  Principal Problem:   Acute on chronic diastolic CHF (congestive heart failure), NYHA class 4 (HCC) Active Problems:   S/P AVR (aortic valve replacement)   Atrial fibrillation with rapid ventricular response (HCC)   Physical deconditioning   Multiple blisters both lower legs   Chronic anticoagulation   Atrial fibrillation with RVR (HCC)   Sternocostal pain  Acute on chronic diastolic CHF  (congestive heart failure), NYHA class 4 (HCC). Echo on Saturdaywas negative for any significant changes. -furosemide  IV BID  Atrial fibrillation with rapid ventricular response (HCC).  -Amiodarone  PO q8hrs  -Cardiology increased Metoprolol to 50 BID -Continue Xarelto. -Heart rate decreased to 90's today, will wait for cards input for need of cardioversion treatment -[place] Vasc score of 2 with 4% risk  Hypokalemia: -Potassium 3.6today -Will continue to monitor and treat as necessary  - ordered IV over 6 hours -Patient does not tolerate increased doses of PO potassium as they worsen GI pain  Mid-Epigstric pain and costovertebral pain: Improved over the previous day again.  -Pain is worsened with increased doses of PO potassium, switched to IV potassium -Voltaren gel continued for costovertebral pain  Microcytic anemia. Hb =8.9 &MCV=74.5 today,Her baseline Hgb has been around 9-11 recently.  -Will repeat CBC asindicated   AKI: Cr slightly worsened to 1.33today. -continue daily BMP to monitor function -Increased lasix to  IV BID today,   Transaminitis: Resolved   Diet: Heart healthy Fluids: NONE Code: Full DVT ppx: Home Xarelto  Dispo: Anticipated discharge in approximately 2-3 day(s).   Lanelle Bal, MD 03/06/2017, 7:52 AM Pager: Pager# [phone]

## 2017-03-06 NOTE — Procedures (Signed)
Electrical Cardioversion Procedure Note JOHNICA ARMWOOD [id number] 1950/06/21  Procedure: Electrical Cardioversion Indications:  Atrial Fibrillation  Procedure Details Consent: Risks of procedure as well as the alternatives and risks of each were explained to the (patient/caregiver).  Consent for procedure obtained. Time Out: Verified patient identification, verified procedure, site/side was marked, verified correct patient position, special equipment/implants available, medications/allergies/relevent history reviewed, required imaging and test results available.  Performed  Patient placed on cardiac monitor, pulse oximetry, supplemental oxygen as necessary.  Sedation given: Pt sedated by anesthesia with diprovan IV. Pacer pads placed anterior and posterior chest.  Cardioverted 1 time(s).  Cardioverted at 120J.  Evaluation Findings: Post procedure EKG shows: NSR Complications: None Patient did tolerate procedure well.   Olga Millers 03/06/2017, 12:25 PM

## 2017-03-07 ENCOUNTER — Encounter (HOSPITAL_COMMUNITY): Payer: Self-pay | Admitting: Cardiology

## 2017-03-07 DIAGNOSIS — I350 Nonrheumatic aortic (valve) stenosis: Secondary | ICD-10-CM

## 2017-03-07 DIAGNOSIS — R0789 Other chest pain: Secondary | ICD-10-CM

## 2017-03-07 LAB — CBC
HCT: 30.1 % — ABNORMAL LOW (ref 36.0–46.0)
HEMOGLOBIN: 8.6 g/dL — AB (ref 12.0–15.0)
MCH: 21.3 pg — ABNORMAL LOW (ref 26.0–34.0)
MCHC: 28.6 g/dL — AB (ref 30.0–36.0)
MCV: 74.5 fL — ABNORMAL LOW (ref 78.0–100.0)
Platelets: 138 10*3/uL — ABNORMAL LOW (ref 150–400)
RBC: 4.04 MIL/uL (ref 3.87–5.11)
RDW: 16.1 % — ABNORMAL HIGH (ref 11.5–15.5)
WBC: 4.2 10*3/uL (ref 4.0–10.5)

## 2017-03-07 LAB — COMPREHENSIVE METABOLIC PANEL
ALT: 22 U/L (ref 14–54)
AST: 19 U/L (ref 15–41)
Albumin: 2.9 g/dL — ABNORMAL LOW (ref 3.5–5.0)
Alkaline Phosphatase: 81 U/L (ref 38–126)
Anion gap: 5 (ref 5–15)
BILIRUBIN TOTAL: 1.6 mg/dL — AB (ref 0.3–1.2)
BUN: 28 mg/dL — ABNORMAL HIGH (ref 6–20)
CO2: 34 mmol/L — ABNORMAL HIGH (ref 22–32)
CREATININE: 1.3 mg/dL — AB (ref 0.44–1.00)
Calcium: 8.8 mg/dL — ABNORMAL LOW (ref 8.9–10.3)
Chloride: 99 mmol/L — ABNORMAL LOW (ref 101–111)
GFR, EST AFRICAN AMERICAN: 49 mL/min — AB (ref >60)
GFR, EST NON AFRICAN AMERICAN: 42 mL/min — AB (ref >60)
Glucose, Bld: 115 mg/dL — ABNORMAL HIGH (ref 65–99)
POTASSIUM: 3.5 mmol/L (ref 3.5–5.1)
Sodium: 138 mmol/L (ref 135–145)
TOTAL PROTEIN: 5.8 g/dL — AB (ref 6.5–8.1)

## 2017-03-07 LAB — LACTATE DEHYDROGENASE: LDH: 335 U/L — AB (ref 98–192)

## 2017-03-07 MED ORDER — POTASSIUM CHLORIDE 10 MEQ/100ML IV SOLN
10.0000 meq | INTRAVENOUS | Status: AC
  Administered 2017-03-07 (×4): 10 meq via INTRAVENOUS
  Filled 2017-03-07: qty 100

## 2017-03-07 NOTE — Progress Notes (Signed)
Pharmacy student rounding with the Internal Medicine Teaching Service/B-2 [facility] team. Patient with acute on chronic diastolic CHF, NYHA class 4 and atrial fibrillation with rapid ventricular response (HCC) was noted to have a most recent BP of 100/65 today with previously low blood pressures of 87/68 and 82/56. Patient had a HR of 59 today. Patient is currently taking furosemide 60 mg BID and metoprolol tartrate 50 mg BID. I's O's evaluated and documented as being net-positive of per 24h.  According to UpToDate, torsemide causes hypotension in <1% of patients. Patient can be stopped on furosemide and started on torsemide IV. The oral dose equivalency for patients with normal renal function is furosemide 40 mg= torsemide 20 mg. According to [employer], for this patient based on her kidney function and indication of heart failure she can be started on torsemide IV 10 to 20 mg; may repeat every 2 hours with double the dose as needed.  Jacqualine Code PharmD Candidate [id number]

## 2017-03-07 NOTE — Progress Notes (Signed)
Progress Note  Patient Name: Jennifer Bauer Date of Encounter: 03/07/2017  Primary Cardiologist: Umair Rosiles  Subjective   66 year old female with a history of acute on chronic diastolic congestive heart failure, aortic valve replacement, and recurrent atrial fibrillation. She has been restarted on amiodarone. She failed card eversion last week and is scheduled today for repeat cardioversion.  She had cardioversion yesterday.  She has a history of acute on chronic diastolic congestive heart failure. She's diuresed quite well.   Her insulin also -7 L throughout this admission.  Still has occasional episodes of shortness breath but his overall breathing much better.  Inpatient Medications    Scheduled Meds: . amiodarone  150 mg Intravenous Once  . amiodarone  400 mg Oral Q8H  . citalopram  20 mg Oral Daily  . diclofenac sodium  4 g Topical QID  . furosemide  60 mg Intravenous BID  . metoprolol tartrate  50 mg Oral BID  . pantoprazole (PROTONIX) IV  40 mg Intravenous QHS  . rivaroxaban  20 mg Oral Q supper  . sodium chloride flush  3 mL Intravenous Q12H  . sucralfate  1 g Oral BID AC  . traMADol  100 mg Oral BID   Continuous Infusions: . sodium chloride Stopped (03/06/17 1301)   PRN Meds: calcium carbonate, polyethylene glycol, sodium chloride flush   Vital Signs    Vitals:   03/06/17 2107 03/06/17 2300 03/07/17 0259 03/07/17 0800  BP: 104/66 102/69 110/80 97/73  Pulse: 66 64 (!) 59 (!) 59  Resp:  (!) Temp:  98.4 F (36.9 C) 98.4 F (36.9 C) (!) 97.5 F (36.4 C)  TempSrc:  Oral Oral Oral  SpO2:  99% 97% 100%  Weight:   264 lb 1.8 oz (119.8 kg)   Height:        Intake/Output Summary (Last 24 hours) at 03/07/17 0909 Last data filed at 03/07/17 0500  Gross per 24 hour  Intake           307.02 ml  Output              600 ml  Net          -292.98 ml   Filed Weights   03/05/17 0645 03/06/17 0413 03/07/17 0259  Weight: 265 lb 14 oz (120.6 kg) 265 lb 3.4  oz (120.3 kg) 264 lb 1.8 oz (119.8 kg)    Telemetry    NSR - Personally Reviewed  ECG    nsr - Personally Reviewed  Physical Exam   GEN: No acute distress.  Appears older than stated age.   Morbidly obese  Neck: No JVD Cardiac: RR, 2/6 systoliic murmur, no  rubs, or gallops.  Respiratory: Clear to auscultation bilaterally. GI: Soft, nontender, non-distended , morbid obesity  MS: No edema; No deformity. Neuro:  Nonfocal  Psych: pleasant,  Simple    Labs    Chemistry Recent Labs Lab 03/01/17 0549  03/05/17 0144 03/06/17 0244 03/07/17 0159  NA 134*  < > 136 137 138  K 4.2  < > 4.2 3.6 3.5  CL 98*  < > 97* 98* 99*  CO2 28  < > 31 32 34*  GLUCOSE 133*  < > 106* 106* 115*  BUN 25*  < > 26* 27* 28*  CREATININE 1.06*  < > 1.19* 1.33* 1.30*  CALCIUM 8.8*  < > 9.0 8.8* 8.8*  PROT 6.5  --   --  6.0* 5.8*  ALBUMIN 3.0*  --   --  2.9* 2.9*  AST 39  --   --  19 19  ALT 59*  --   --  27 22  ALKPHOS 95  --   --  83 81  BILITOT 1.6*  --   --  2.0* 1.6*  GFRNONAA 54*  < > 47* 41* 42*  GFRAA >60  < > 55* 48* 49*  ANIONGAP 8  < > < > = values in this interval not displayed.   Hematology Recent Labs Lab 03/05/17 0144 03/06/17 0244 03/07/17 0159  WBC 5.8 4.9 4.2  RBC 4.30 4.20 4.04  HGB 9.1* 8.9* 8.6*  HCT 31.9* 31.3* 30.1*  MCV 74.2* 74.5* 74.5*  MCH 21.2* 21.2* 21.3*  MCHC 28.5* 28.4* 28.6*  RDW 16.1* 16.2* 16.1*  PLT 159 139* 138*    Cardiac EnzymesNo results for input(s): TROPONINI in the last 168 hours. No results for input(s): TROPIPOC in the last 168 hours.   BNPNo results for input(s): BNP, PROBNP in the last 168 hours.   DDimer No results for input(s): DDIMER in the last 168 hours.   Radiology    No results found.  Cardiac Studies     Patient Profile     66 y.o. female with acute on chronic diastolic heart failure. She also has moderate aortic stenosis and recurrent atrial fibrillation. .  Assessment & Plan    1. Atrial fibrillation:  The patient had a successful cardioversion yesterday. She has maintained normal sinus rhythm.  Continue amiodarone loading for now.  At discharge she will need to go home on amiodarone 200 mg twice a day for 2-3 weeks. Following that  her dose will need to be reduced to 200 mg once a day  2. Acute on chronic diastolic congestive heart failure. Continue Lasix.  She still has some occasional shortness of breath. Will transition to oral Lasix or torsemide in the next several days.  3. Aortic stenosis: She status post aortic valve replacement. She has moderate aortic stenosis. She's not a candidate for redo aortic valve surgery. She may be a candidate for TAVR if her valve worsens   For questions or updates, please contact [facility] Please consult [email or web address] for contact info under Cardiology/STEMI.      Signed, Kristeen Miss, MD  03/07/2017, 9:09 AM

## 2017-03-07 NOTE — Progress Notes (Signed)
   Subjective: Patient was resting comfortably her chair today upon entering the room. She stated that she was feeling about the same, denied pain other than the mild abdominal pain(which improves with sucralfate) and sternocostal pain. She was informed that her heart rate had remained in sinus rhythm since the cardioversion, but we would need to continue to monitor her and complete her diuresis. She denied nausea, vomiting, diarrhea, constipation, fever, chills, headache, or visual changes.  Objective:  Vital signs in last 24 hours: Vitals:   03/07/17 0259 03/07/17 0800 03/07/17 1132 03/07/17 1200  BP: 110/80 97/73 (!) 87/68   Pulse: (!) 59 (!) 59    Resp: 17 20 (!) 21   Temp: 98.4 F (36.9 C) (!) 97.5 F (36.4 C) 97.6 F (36.4 C)   TempSrc: Oral Oral Oral   SpO2: 97% 100%  98%  Weight: 264 lb 1.8 oz (119.8 kg)     Height:       ROS negative except as per HPI.  Physical Exam  Constitutional: She appears well-developed and well-nourished. No distress.  Cardiovascular: Normal rate and regular rhythm.   Pulmonary/Chest: Effort normal and breath sounds normal.  Abdominal: Soft. Bowel sounds are normal. She exhibits no distension. There is tenderness.  Musculoskeletal: She exhibits edema and tenderness.  Neurological: She is alert.  Skin: Skin is warm. She is not diaphoretic.   Assessment/Plan:  Principal Problem:   Acute on chronic diastolic CHF (congestive heart failure), NYHA class 4 (HCC) Active Problems:   S/P AVR (aortic valve replacement)   Atrial fibrillation with rapid ventricular response (HCC)   Physical deconditioning   Multiple blisters both lower legs   Chronic anticoagulation   Sternocostal pain  Acute on chronic diastolic CHF (congestive heart failure), NYHA class 4 (HCC). Echo on Saturdaywas negative for any significant changes. -furosemide  IVBID  Atrial fibrillation with rapid ventricular response (HCC).  -Amiodarone  PO q8hrs  -Cardiology  increased Metoprolol to 50 BID -Continue Xarelto. -Heart rate decreased to upper 50's today -[place] Vasc score of 2 with 4% risk  Hypokalemia: -Potassium 3.5today -Will continue to monitor and treat as necessary  - ordered IV today -Patient does not tolerate increased doses of PO potassium as they worsen GI pain significantly   Mid-Epigstric pain and costovertebral pain: -Pain is worsened with increased doses of PO potassium, switched to IV potassium -Voltaren gel continued for costovertebral pain  Microcytic anemia. Hb =8.6&MCV=74.5 today,Her baseline Hgb has been around 9-11 recently.  -Will repeat CBC asindicated   AKI: Cr slightly worsened to 1.33today. -continue daily BMP to monitor function -Increased lasix to  IV BID today,   Transaminitis: Resolved   Diet: Heart healthy Fluids: NONE Code: Full DVT ppx: Home Xarelto Dispo: Anticipated discharge in approximately 2-3 day(s).   Lanelle Bal, MD 03/07/2017, 2:25 PM Pager: Pager# [phone]

## 2017-03-08 LAB — CBC
HEMATOCRIT: 31.7 % — AB (ref 36.0–46.0)
Hemoglobin: 8.9 g/dL — ABNORMAL LOW (ref 12.0–15.0)
MCH: 20.9 pg — ABNORMAL LOW (ref 26.0–34.0)
MCHC: 28.1 g/dL — AB (ref 30.0–36.0)
MCV: 74.6 fL — AB (ref 78.0–100.0)
PLATELETS: 136 10*3/uL — AB (ref 150–400)
RBC: 4.25 MIL/uL (ref 3.87–5.11)
RDW: 16.3 % — AB (ref 11.5–15.5)
WBC: 5.7 10*3/uL (ref 4.0–10.5)

## 2017-03-08 LAB — COMPREHENSIVE METABOLIC PANEL
ALBUMIN: 3.1 g/dL — AB (ref 3.5–5.0)
ALT: 22 U/L (ref 14–54)
AST: 19 U/L (ref 15–41)
Alkaline Phosphatase: 89 U/L (ref 38–126)
Anion gap: 9 (ref 5–15)
BILIRUBIN TOTAL: 1.6 mg/dL — AB (ref 0.3–1.2)
BUN: 30 mg/dL — AB (ref 6–20)
CO2: 30 mmol/L (ref 22–32)
CREATININE: 1.39 mg/dL — AB (ref 0.44–1.00)
Calcium: 8.8 mg/dL — ABNORMAL LOW (ref 8.9–10.3)
Chloride: 99 mmol/L — ABNORMAL LOW (ref 101–111)
GFR calc Af Amer: 45 mL/min — ABNORMAL LOW (ref >60)
GFR, EST NON AFRICAN AMERICAN: 39 mL/min — AB (ref >60)
Glucose, Bld: 107 mg/dL — ABNORMAL HIGH (ref 65–99)
POTASSIUM: 4.4 mmol/L (ref 3.5–5.1)
Sodium: 138 mmol/L (ref 135–145)
TOTAL PROTEIN: 6.4 g/dL — AB (ref 6.5–8.1)

## 2017-03-08 LAB — HAPTOGLOBIN: Haptoglobin: 50 mg/dL (ref 34–200)

## 2017-03-08 MED ORDER — TORSEMIDE 20 MG PO TABS
40.0000 mg | ORAL_TABLET | Freq: Every day | ORAL | Status: DC
  Administered 2017-03-08 – 2017-03-09 (×2): 40 mg via ORAL
  Filled 2017-03-08 (×2): qty 2

## 2017-03-08 MED ORDER — PANTOPRAZOLE SODIUM 40 MG PO TBEC
40.0000 mg | DELAYED_RELEASE_TABLET | Freq: Every day | ORAL | Status: DC
  Administered 2017-03-08: 40 mg via ORAL
  Filled 2017-03-08: qty 1

## 2017-03-08 MED ORDER — METOPROLOL TARTRATE 50 MG PO TABS: 50.0000 mg | ORAL_TABLET | Freq: Two times a day (BID) | ORAL | Status: DC

## 2017-03-08 MED ORDER — AMIODARONE HCL 200 MG PO TABS
200.0000 mg | ORAL_TABLET | Freq: Every day | ORAL | Status: DC
  Administered 2017-03-09: 200 mg via ORAL
  Filled 2017-03-08: qty 1

## 2017-03-08 MED ORDER — METOPROLOL TARTRATE 25 MG PO TABS
25.0000 mg | ORAL_TABLET | Freq: Two times a day (BID) | ORAL | Status: DC
  Administered 2017-03-08 – 2017-03-09 (×2): 25 mg via ORAL
  Filled 2017-03-08 (×2): qty 1

## 2017-03-08 NOTE — Progress Notes (Signed)
Progress Note  Patient Name: Jennifer Bauer Date of Encounter: 03/08/2017  Primary Cardiologist: Nahser   Subjective   66 year old female with a history of acute on chronic diastolic congestive heart failure, aortic valve replacement, and recurrent atrial fibrillation. She has been restarted on amiodarone Was cardioverted.  HR and BP are low this am   Inpatient Medications    Scheduled Meds: . amiodarone  150 mg Intravenous Once  . amiodarone  400 mg Oral Q8H  . citalopram  20 mg Oral Daily  . diclofenac sodium  4 g Topical QID  . metoprolol tartrate  50 mg Oral BID  . pantoprazole (PROTONIX) IV  40 mg Intravenous QHS  . rivaroxaban  20 mg Oral Q supper  . sodium chloride flush  3 mL Intravenous Q12H  . sucralfate  1 g Oral BID AC  . torsemide  40 mg Oral Daily  . traMADol  100 mg Oral BID   Continuous Infusions: . sodium chloride Stopped (03/06/17 1301)   PRN Meds: calcium carbonate, polyethylene glycol, sodium chloride flush   Vital Signs    Vitals:   03/08/17 0353 03/08/17 0800 03/08/17 0909 03/08/17 0935  BP: 93/68 104/65    Pulse: (!) 53 (!) 55 (!) 57 62  Resp: Temp: 98 F (36.7 C) 98.1 F (36.7 C)    TempSrc: Oral Oral    SpO2: 97% 98%  100%  Weight: 264 lb 8.8 oz (120 kg)     Height:        Intake/Output Summary (Last 24 hours) at 03/08/17 1113 Last data filed at 03/08/17 1019  Gross per 24 hour  Intake             1365 ml  Output              100 ml  Net             1265 ml   Filed Weights   03/06/17 0413 03/07/17 0259 03/08/17 0353  Weight: 265 lb 3.4 oz (120.3 kg) 264 lb 1.8 oz (119.8 kg) 264 lb 8.8 oz (120 kg)    Telemetry    Sinus brady at 52  - Personally Reviewed  ECG     sinus rhythm  - Personally Reviewed  Physical Exam   GEN: No acute distress.  Elderly female,  Appears chronically ell  Neck: No JVD Cardiac: RR, normal S1S2   Respiratory: Clear to auscultation bilaterally. GI: soft, obese, non tender  MS: No  edema; No deformity. Neuro:  Nonfocal  Psych: pleasant, simple in her thought process  Labs    Chemistry Recent Labs Lab 03/06/17 0244 03/07/17 0159 03/08/17 0208  NA 137 138 138  K 3.6 3.5 4.4  CL 98* 99* 99*  CO2 32 34* 30  GLUCOSE 106* 115* 107*  BUN 27* 28* 30*  CREATININE 1.33* 1.30* 1.39*  CALCIUM 8.8* 8.8* 8.8*  PROT 6.0* 5.8* 6.4*  ALBUMIN 2.9* 2.9* 3.1*  AST ALT ALKPHOS 83 81 89  BILITOT 2.0* 1.6* 1.6*  GFRNONAA 41* 42* 39*  GFRAA 48* 49* 45*  ANIONGAP Hematology Recent Labs Lab 03/06/17 0244 03/07/17 0159 03/08/17 0208  WBC 4.9 4.2 5.7  RBC 4.20 4.04 4.25  HGB 8.9* 8.6* 8.9*  HCT 31.3* 30.1* 31.7*  MCV 74.5* 74.5* 74.6*  MCH 21.2* 21.3* 20.9*  MCHC 28.4* 28.6* 28.1*  RDW 16.2* 16.1*  16.3*  PLT 139* 138* 136*    Cardiac EnzymesNo results for input(s): TROPONINI in the last 168 hours. No results for input(s): TROPIPOC in the last 168 hours.   BNPNo results for input(s): BNP, PROBNP in the last 168 hours.   DDimer No results for input(s): DDIMER in the last 168 hours.   Radiology    No results found.  Cardiac Studies      Patient Profile     66 y.o. female with acute on chronic diastolic congestive heart therapy she had recurrent atrial fibrillation. She was successfully cardioverted and remains in sinus rhythm.  Assessment & Plan    1. Persistent atrial fibrillation:   She's very bradycardic and hypotensive this morning. She's currently on amiodarone 400 mg 3 times a day. Will reduce the amiodarone to 200 mg once a day. Will reduce the metoprolol to 25 mg twice a day. In to Xarelto.   2. Chronic diastolic congestive heart failure: Lasix is currently on hold because of her hypotension and slightly worsening renal function.   Will be starting torsemide      For questions or updates, please contact [facility] Please consult [email or web address] for contact info under Cardiology/STEMI.       Signed, Kristeen Miss, MD  03/08/2017, 11:13 AM

## 2017-03-08 NOTE — Progress Notes (Signed)
Physical Therapy Treatment Patient Details Name: Jennifer Bauer MRN: [id number] DOB: [birth year] Today's Date: 03/08/2017    History of Present Illness 66 year old female with a history of acute on chronic diastolic congestive heart failure, aortic valve replacement, and recurrent atrial fibrillation. She has been restarted on amiodarone. She failed card eversion last week and is scheduled today for repeat cardioversion.  Plan for second cardioversion today.    PT Comments    Pt admitted with above diagnosis. Pt currently with functional limitations due to balance and endurance deficits. Pt was able to ambulate a short distance x 2 with RW with chair follow and min assist.  Pt c/o chest discomfort and ultimately BP dropped to 80/65 after second walk.  Felt better once she sat downa nd  BP then 85/75.  Had pt perform some exercise and finished treatment at that point.  Will continue acute PT.   Pt will benefit from skilled PT to increase their independence and safety with mobility to allow discharge to the venue listed below.     Follow Up Recommendations  SNF;Supervision/Assistance - 24 hour     Equipment Recommendations  None recommended by PT    Recommendations for Other Services       Precautions / Restrictions Precautions Precautions: Fall Restrictions Weight Bearing Restrictions: No    Mobility  Bed Mobility               General bed mobility comments: NT in chair  Transfers Overall transfer level: Needs assistance Equipment used: Rolling walker (2 wheeled) Transfers: Sit to/from Stand Sit to Stand: Min guard;Min assist         General transfer comment: Needed assist to power up and steadying assist to stay up.    Ambulation/Gait Ambulation/Gait assistance: Min assist;+2 safety/equipment Ambulation Distance (Feet): 9 Feet (3 feet then 6 feet) Assistive device: Rolling walker (2 wheeled) Gait Pattern/deviations: Step-through pattern;Decreased stride  length;Drifts right/left;Wide base of support;Trunk flexed;Antalgic   Gait velocity interpretation: Below normal speed for age/gender General Gait Details: Pt ambulated with RW a short distance x 2.  Limited by poor postural stability with incr trunk flexion as well as states she gets SOB after a few steps and that she has chest discomfort.  Once pt sits, she is ok.  Pts HR stable and sats were 94% on 1L but did incr to 3L for SOB.  Then after second walk, pt desat from 104/65 to 80/65 with dizziness.  Decided to let pt rest at this point therefore returned pt to room and she did a few exercises to finish up treatment.  Pt BP on departure 85/75.  Returned O2 to 1L on departure from room with sats >90%. Close chair follow to pt.     Stairs            Wheelchair Mobility    Modified Rankin (Stroke Patients Only)       Balance Overall balance assessment: Needs assistance;History of Falls Sitting-balance support: No upper extremity supported;Feet supported Sitting balance-Leahy Scale: Fair     Standing balance support: Bilateral upper extremity supported;During functional activity Standing balance-Leahy Scale: Poor Standing balance comment: relies on RW for balance                             Cognition Arousal/Alertness: Awake/alert Behavior During Therapy: WFL for tasks assessed/performed Overall Cognitive Status: Within Functional Limits for tasks assessed  Exercises General Exercises - Lower Extremity Ankle Circles/Pumps: AROM;Both;10 reps;Supine Long Arc Quad: AROM;Both;Seated;20 reps Hip Flexion/Marching: AROM;Both;Seated;Standing;20 reps    General Comments        Pertinent Vitals/Pain Pain Assessment: Faces Faces Pain Scale: Hurts a little bit Pain Location: chest  Pain Descriptors / Indicators: Discomfort Pain Intervention(s): Limited activity within patient's tolerance;Monitored during  session;Repositioned    Home Living                      Prior Function            PT Goals (current goals can now be found in the care plan section) Progress towards PT goals: Progressing toward goals    Frequency    Min 2X/week      PT Plan Current plan remains appropriate    Co-evaluation              AM-PAC PT "6 Clicks" Daily Activity  Outcome Measure  Difficulty turning over in bed (including adjusting bedclothes, sheets and blankets)?: A Lot Difficulty moving from lying on back to sitting on the side of the bed? : A Lot Difficulty sitting down on and standing up from a chair with arms (e.g., wheelchair, bedside commode, etc,.)?: A Lot Help needed moving to and from a bed to chair (including a wheelchair)?: A Lot Help needed walking in hospital room?: A Lot Help needed climbing 3-5 steps with a railing? : Total 6 Click Score: 11    End of Session Equipment Utilized During Treatment: Gait belt;Oxygen Activity Tolerance: Patient limited by fatigue Patient left: in chair;with call bell/phone within reach Nurse Communication: Mobility status PT Visit Diagnosis: Muscle weakness (generalized) (M62.81);Other abnormalities of gait and mobility (R26.89);Unsteadiness on feet (R26.81)     Time: 2130-8657 PT Time Calculation (min) (ACUTE ONLY): 24 min  Charges:  $Gait Training: 8-22 mins $Therapeutic Exercise: 8-22 mins                    G Codes:       Kadelyn Dimascio,PT Acute Rehabilitation [phone] (pager)    Berline Lopes 03/08/2017, 10:59 AM

## 2017-03-08 NOTE — Progress Notes (Signed)
   Subjective: Patient was again sitting in her chair upon entering the room today. She was in watching her favorite TV show a lymphocele. The patient stated that she had not slept well overnight due to her abdominal pain is somewhat worsened, but is still related with sucralfate. She does not like sucralfate due to its taste, but will take continue to take it if it helps. Patient stated that her chest pain improves with Voltaren gel, she is glad to know that her heart rate is being controlled and has to current complaints. Patient denies shortness of breath, cough, headache, visual changes, chest pain, muscle aches, fever, or chills.  Objective:  Vital signs in last 24 hours: Vitals:   03/07/17 1601 03/07/17 1940 03/07/17 2335 03/08/17 0353  BP: 97/68 100/65 100/67 93/68  Pulse:  (!) 59 (!) 58 (!) 53  Resp: (!) Temp: 97.8 F (36.6 C) 97.9 F (36.6 C) 98 F (36.7 C) 98 F (36.7 C)  TempSrc: Oral Oral Oral Oral  SpO2: 98% 99% 95% 97%  Weight:    264 lb 8.8 oz (120 kg)  Height:       ROS negative except as per HPI.  Physical Exam  Constitutional: She is oriented to person, place, and time. She appears well-developed and well-nourished. No distress.  HENT:  Head: Normocephalic and atraumatic.  Cardiovascular: Normal rate and regular rhythm.   Murmur heard.  Decrescendo systolic murmur is present with a grade of 3/6  Pulmonary/Chest: Effort normal. No respiratory distress. She has decreased breath sounds in the right lower field.  Abdominal: Soft. Bowel sounds are normal. There is tenderness.  Musculoskeletal: She exhibits no edema or tenderness.  Neurological: She is alert and oriented to person, place, and time.  Skin: Skin is warm. Capillary refill takes less than 2 seconds.  Psychiatric: She has a normal mood and affect.  Nursing note and vitals reviewed.  Assessment/Plan:  Principal Problem:   Acute on chronic diastolic CHF (congestive heart failure), NYHA class  4 (HCC) Active Problems:   S/P AVR (aortic valve replacement)   Atrial fibrillation with rapid ventricular response (HCC)   Physical deconditioning   Multiple blisters both lower legs   Chronic anticoagulation   Sternocostal pain  Acute on chronic diastolic CHF (congestive heart failure), NYHA class 4 (HCC). -furosemide  IVBID discontinued -torsemide  PO ordered  Atrial fibrillation with rapid ventricular response (HCC).  -Amiodarone  PO q8hrs  -Cardiology increased Metoprolol to 50BID -Continue Xarelto. -Heart rate decreased to upper 50's today -Please keep HR 55-75, call cards or primary team if it is consistently below 55 and asymptomatic, if symptomatic hold and call primary team. Thank you  -[place] Vasc score of 2 with 4% risk  Hypokalemia: -Potassium 4.4today -Will continue to monitor and treat as necessary  -Patient doesnot tolerate increased doses of PO potassium as they worsen GI pain significantly   Mid-Epigstric pain and costovertebral pain: -Pain is worsened with increased doses of PO potassium, switched to IV potassium -Voltaren gel continued for costovertebral pain  Microcytic anemia. -Hb =8.9  -Will repeat CBC asindicated   AKI: Cr slightly worsened to 1.39today. -continue daily BMP to monitor function  Transaminitis: Resolved   Diet: Heart healthy Fluids: NONE Code: Full DVT ppx: Home Xarelto Dispo: Anticipated discharge in approximately 1-2 day(s).   Lanelle Bal, MD 03/08/2017, 6:51 AM Pager: Pager# [phone]

## 2017-03-09 DIAGNOSIS — E876 Hypokalemia: Secondary | ICD-10-CM

## 2017-03-09 DIAGNOSIS — R1013 Epigastric pain: Secondary | ICD-10-CM

## 2017-03-09 LAB — COMPREHENSIVE METABOLIC PANEL
ALBUMIN: 3 g/dL — AB (ref 3.5–5.0)
ALT: 18 U/L (ref 14–54)
ANION GAP: 13 (ref 5–15)
AST: 18 U/L (ref 15–41)
Alkaline Phosphatase: 81 U/L (ref 38–126)
BILIRUBIN TOTAL: 1.8 mg/dL — AB (ref 0.3–1.2)
BUN: 30 mg/dL — ABNORMAL HIGH (ref 6–20)
CO2: 27 mmol/L (ref 22–32)
Calcium: 8.9 mg/dL (ref 8.9–10.3)
Chloride: 97 mmol/L — ABNORMAL LOW (ref 101–111)
Creatinine, Ser: 1.43 mg/dL — ABNORMAL HIGH (ref 0.44–1.00)
GFR calc Af Amer: 44 mL/min — ABNORMAL LOW (ref >60)
GFR calc non Af Amer: 38 mL/min — ABNORMAL LOW (ref >60)
GLUCOSE: 93 mg/dL (ref 65–99)
POTASSIUM: 3.7 mmol/L (ref 3.5–5.1)
SODIUM: 137 mmol/L (ref 135–145)
TOTAL PROTEIN: 6.1 g/dL — AB (ref 6.5–8.1)

## 2017-03-09 MED ORDER — PANTOPRAZOLE SODIUM 40 MG PO TBEC: 40.0000 mg | DELAYED_RELEASE_TABLET | Freq: Two times a day (BID) | ORAL | Status: DC

## 2017-03-09 MED ORDER — METOPROLOL TARTRATE 25 MG PO TABS: 25.0000 mg | ORAL_TABLET | Freq: Two times a day (BID) | ORAL | 3 refills | Status: AC

## 2017-03-09 MED ORDER — AMIODARONE HCL 200 MG PO TABS: 200.0000 mg | ORAL_TABLET | Freq: Every day | ORAL | 1 refills | Status: AC

## 2017-03-09 MED ORDER — TORSEMIDE 20 MG PO TABS: 40.0000 mg | ORAL_TABLET | Freq: Every day | ORAL | 3 refills | Status: AC

## 2017-03-09 MED ORDER — PANTOPRAZOLE SODIUM 40 MG PO TBEC
40.0000 mg | DELAYED_RELEASE_TABLET | Freq: Every day | ORAL | Status: DC
  Administered 2017-03-09: 40 mg via ORAL
  Filled 2017-03-09: qty 1

## 2017-03-09 MED ORDER — PANTOPRAZOLE SODIUM 40 MG PO TBEC: 40.0000 mg | DELAYED_RELEASE_TABLET | Freq: Every day | ORAL | 3 refills | Status: AC

## 2017-03-09 MED ORDER — FAMOTIDINE 20 MG PO TABS: 20.0000 mg | ORAL_TABLET | Freq: Two times a day (BID) | ORAL | Status: DC

## 2017-03-09 MED ORDER — SUCRALFATE 1 GM/10ML PO SUSP: 1.0000 g | Freq: Two times a day (BID) | ORAL | 1 refills | Status: AC

## 2017-03-09 MED ORDER — FAMOTIDINE 20 MG PO TABS: 20.0000 mg | ORAL_TABLET | Freq: Two times a day (BID) | ORAL | 3 refills | Status: AC

## 2017-03-09 MED ORDER — DICLOFENAC SODIUM 1 % TD GEL: 4.0000 g | Freq: Four times a day (QID) | TRANSDERMAL | 11 refills | Status: AC

## 2017-03-09 NOTE — Progress Notes (Signed)
Discharged to [facility] by ambulance, stable. Belongings taken home. Discharge summary given to ambulance staff.

## 2017-03-09 NOTE — Progress Notes (Signed)
Report given to [facility] staff.

## 2017-03-09 NOTE — Progress Notes (Signed)
All set to go back to [facility], awaiting [facility] ambulance to come  for transport.

## 2017-03-09 NOTE — Discharge Instructions (Signed)
Please follow-up with your PCP in one week.  If at any time you were to develop signs and symptoms that are concerning, such as but not limited to, fatigue, atrial fibrillation with rapid heart rate, syncope, or other concerning symptoms, please call your PCP or return to the clinic.  Thank you for your visit to [facility].

## 2017-03-09 NOTE — Clinical Social Work Note (Signed)
CSW facilitated patient discharge including contacting patient family and facility to confirm patient discharge plans. Clinical information faxed to facility and family agreeable with plan. CSW arranged ambulance transport via [facility] to [facility] at 3:30 pm. RN to call report prior to discharge [phone]).  CSW will sign off for now as social work intervention is no longer needed. Please consult [place] again if new needs arise.  Charlynn Court, CSW [phone]

## 2017-03-09 NOTE — Progress Notes (Signed)
Progress Note  Patient Name: Jennifer Bauer Date of Encounter: 03/09/2017  Primary Cardiologist: Cloyce Paterson   Subjective   66 year old female with a history of chronic diastolic congestive heart failure, recurrent atrial fibrillation. HR and BP are better   Inpatient Medications    Scheduled Meds: . amiodarone  150 mg Intravenous Once  . amiodarone  200 mg Oral Daily  . citalopram  20 mg Oral Daily  . diclofenac sodium  4 g Topical QID  . metoprolol tartrate  25 mg Oral BID  . pantoprazole  40 mg Oral QHS  . rivaroxaban  20 mg Oral Q supper  . sodium chloride flush  3 mL Intravenous Q12H  . sucralfate  1 g Oral BID AC  . torsemide  40 mg Oral Daily  . traMADol  100 mg Oral BID   Continuous Infusions: . sodium chloride Stopped (03/06/17 1301)   PRN Meds: calcium carbonate, polyethylene glycol, sodium chloride flush   Vital Signs    Vitals:   03/08/17 1931 03/08/17 2332 03/09/17 0511 03/09/17 0800  BP:   Pulse: (!) 54 (!) 53 (!) 48   Resp: 14 19 (!) 23   Temp: 97.7 F (36.5 C) 97.9 F (36.6 C) 97.8 F (36.6 C) 98.7 F (37.1 C)  TempSrc: Oral Oral Oral Oral  SpO2: 98% 95% 100%   Weight:   265 lb 10.5 oz (120.5 kg)   Height:        Intake/Output Summary (Last 24 hours) at 03/09/17 1016 Last data filed at 03/09/17 0538  Gross per 24 hour  Intake              585 ml  Output              350 ml  Net              235 ml   Filed Weights   03/07/17 0259 03/08/17 0353 03/09/17 0511  Weight: 264 lb 1.8 oz (119.8 kg) 264 lb 8.8 oz (120 kg) 265 lb 10.5 oz (120.5 kg)    Telemetry    NSR  - Personally Reviewed  ECG     NSR  - Personally Reviewed  Physical Exam   GEN: No acute distress.  Slightly short of breath  Neck: No JVD Cardiac: RR, soft systolic murmur  Respiratory: few rales  GI: Soft, nontender, non-distended  MS: leg are wrapped with ACE wrap.  1-2+ edema . Neuro:  Nonfocal  Psych:  difficlut to assess   Labs     Chemistry Recent Labs Lab 03/07/17 0159 03/08/17 0208 03/09/17 0235  NA 138 138 137  K 3.5 4.4 3.7  CL 99* 99* 97*  CO2 34* 30 27  GLUCOSE 115* 107* 93  BUN 28* 30* 30*  CREATININE 1.30* 1.39* 1.43*  CALCIUM 8.8* 8.8* 8.9  PROT 5.8* 6.4* 6.1*  ALBUMIN 2.9* 3.1* 3.0*  AST ALT ALKPHOS 81 89 81  BILITOT 1.6* 1.6* 1.8*  GFRNONAA 42* 39* 38*  GFRAA 49* 45* 44*  ANIONGAP Hematology Recent Labs Lab 03/06/17 0244 03/07/17 0159 03/08/17 0208  WBC 4.9 4.2 5.7  RBC 4.20 4.04 4.25  HGB 8.9* 8.6* 8.9*  HCT 31.3* 30.1* 31.7*  MCV 74.5* 74.5* 74.6*  MCH 21.2* 21.3* 20.9*  MCHC 28.4* 28.6* 28.1*  RDW 16.2* 16.1* 16.3*  PLT 139* 138* 136*    Cardiac EnzymesNo results for  input(s): TROPONINI in the last 168 hours. No results for input(s): TROPIPOC in the last 168 hours.   BNPNo results for input(s): BNP, PROBNP in the last 168 hours.   DDimer No results for input(s): DDIMER in the last 168 hours.   Radiology    No results found.  Cardiac Studies      Patient Profile     66 y.o. female with acute on chronic diastolic congestive heart failure and recurrent atrial fibrillation.  Assessment & Plan    1. Persistent atrial fibrillation: The patient has been successfully cardioverted and has maintained normal sinus rhythm which she is on oral amiodarone. Continue current medications.  She is on Xarelto 20 mg a day.  2. Acute on chronic diastolic congestive heart failure: Continue current dose of torsemide. She lives in a skilled nursing facility. I suspect that she is fairly close to being able to discharge to the skilled nursing facility. She denies any short of breath or chest pain.  For questions or updates, please contact [facility] Please consult [email or web address] for contact info under Cardiology/STEMI.      Signed, Kristeen Miss, MD  03/09/2017, 10:16 AM

## 2017-03-09 NOTE — Discharge Summary (Signed)
Name: Jennifer Bauer MRN: [id number] DOB: February 05, 1951 66 y.o. PCP: Lonie Peak, PA-C  Date of Admission: 02/22/2017 10:33 AM Date of Discharge: 03/09/2017 Attending Physician: Inez Catalina, MD  Discharge Diagnosis: Principal Problem:   Acute on chronic diastolic CHF (congestive heart failure), NYHA class 4 (HCC) Active Problems:   S/P AVR (aortic valve replacement)   Atrial fibrillation with rapid ventricular response (HCC)   Physical deconditioning   Multiple blisters both lower legs   Chronic anticoagulation   Sternocostal pain   Midepigastric pain   Hypokalemia   Discharge Medications: Allergies as of 03/09/2017      Reactions   Acetaminophen    tachycardia      Medication List    STOP taking these medications   acetaminophen 325 MG tablet Commonly known as:  TYLENOL   diltiazem 180 MG 24 hr capsule Commonly known as:  CARDIZEM CD   furosemide 80 MG tablet Commonly known as:  LASIX   metoprolol succinate 100 MG 24 hr tablet Commonly known as:  TOPROL-XL   ondansetron 4 MG tablet Commonly known as:  ZOFRAN   potassium chloride SA 20 MEQ tablet Commonly known as:  K-DUR,KLOR-CON     TAKE these medications   amiodarone 200 MG tablet Commonly known as:  PACERONE Take 1 tablet (200 mg total) by mouth daily.   citalopram 20 MG tablet Commonly known as:  CELEXA Take 20 mg by mouth daily.   diclofenac sodium 1 % Gel Commonly known as:  VOLTAREN Apply 4 g topically 4 (four) times daily.   famotidine 20 MG tablet Commonly known as:  PEPCID Take 1 tablet (20 mg total) by mouth 2 (two) times daily with a meal.   metoprolol tartrate 25 MG tablet Commonly known as:  LOPRESSOR Take 1 tablet (25 mg total) by mouth 2 (two) times daily. What changed:  medication strength  how much to take  when to take this  reasons to take this   pantoprazole 40 MG tablet Commonly known as:  PROTONIX Take 1 tablet (40 mg total) by mouth daily.   silver  sulfADIAZINE 1 % cream Commonly known as:  SILVADENE Apply 1 application topically daily. Blisters on her legs   sucralfate 1 GM/10ML suspension Commonly known as:  CARAFATE Take 10 mLs (1 g total) by mouth 2 (two) times daily before a meal.   torsemide 20 MG tablet Commonly known as:  DEMADEX Take 2 tablets (40 mg total) by mouth daily.   traMADol 50 MG tablet Commonly known as:  ULTRAM Take 100 mg by mouth 2 (two) times daily.   XARELTO 20 MG Tabs tablet Generic drug:  rivaroxaban Take 20 mg by mouth daily.            Discharge Care Instructions        Start     Ordered   03/10/17 0000  amiodarone (PACERONE) 200 MG tablet  Daily     03/09/17 1330   03/10/17 0000  torsemide (DEMADEX) 20 MG tablet  Daily     03/09/17 1330   03/09/17 0000  diclofenac sodium (VOLTAREN) 1 % GEL  4 times daily     03/09/17 1330   03/09/17 0000  famotidine (PEPCID) 20 MG tablet  2 times daily with meals     03/09/17 1330   03/09/17 0000  metoprolol tartrate (LOPRESSOR) 25 MG tablet  2 times daily     03/09/17 1330   03/09/17 0000  pantoprazole (PROTONIX) 40 MG tablet  Daily     03/09/17 1330   03/09/17 0000  sucralfate (CARAFATE) 1 GM/10ML suspension  2 times daily before meals     03/09/17 1330   03/09/17 0000  Increase activity slowly     03/09/17 1330   03/09/17 0000  Diet - low sodium heart healthy     03/09/17 1330   03/09/17 0000  Call MD for:  persistant dizziness or light-headedness     03/09/17 1330      Disposition and follow-up:   [name] was discharged from [facility] in Stable condition.  At the hospital follow up visit please address:  1.  A. Atrial fibrillation status and medication tolerance, Amiodarone       B. Mid-epigastric pain, on a PPI and H2 blocker. Please reassess at her next appointment       C. Sternocostal pain, please assess for improvement of her pain      D. Bilateral blisters of the skin, ankle to knee      E.  Hypokalemia, please check CMP      F. Volume status, respiratory status, O2 saturation, room air vs O2 needs      G. Hyperbilirubinemia, please consider following with U/S if indicated  2.  Labs / imaging needed at time of follow-up: CBC, CMP, EKG  3.  Pending labs/ test needing follow-up: n/a  Follow-up Appointments:  Contact information for follow-up providers    Lonie Peak, PA-C Follow up in 1 week(s).   Specialty:  Physician Assistant Why:  Please follow-up in one week.  Contact information: [address] [place] [address] [phone]            Contact information for after-discharge care    Destination    [facility] Follow up.   Specialty:  Skilled Nursing Chief of Staff information: [address] [place] [address] [phone]                  Hospital Course by problem list: Principal Problem:   Acute on chronic diastolic CHF (congestive heart failure), NYHA class 4 (HCC) Active Problems:   S/P AVR (aortic valve replacement)   Atrial fibrillation with rapid ventricular response (HCC)   Physical deconditioning   Multiple blisters both lower legs   Chronic anticoagulation   Sternocostal pain   Midepigastric pain   Hypokalemia   1. Acute on Chronic diastolic CHF, NYHA class 4, aortic valve replacement  The patient was initially admitted for A. Fib with RVR and for him overload. She was treated with IV furosemide over the course of her stay with little success in completing adequate diuresis. As the patient was in atrial fibrillation, with soft blood pressures, she was unable to be properly diuresed without inducing hypotension. As such a fine balance between adequate diuresis to decrease volume overload and maintaining the patient's mean arterial pressure to satisfactory levels was acquired. The patient's shortness of breath, improved significant.His bilateral pitting edema improved somewhat but remained  similar to that as the admission. The patient was able to fully saturated on room air on the day of discharge. She had been on room air greater than 4 days prior to discharge. Initial x-ray of the chest indicated right-sided pulmonary edema more significantly than left-sided. This moderately resolved over the course of her stay with improvement of shortness of breath. She was eventually placed on by mouth torsemide, followed for an additional day, and discharged in stable condition.  There is a slight notable increase in the patient's creatinine level, however, given the level of diuresis this was not concerning.  2. Atrial fibrillation with rapid ventricular response with chronic anticoagulation  The patient was admitted for atrial fibrillation with rapid ventricular response. She was initially placed on Cardizem drip which resulted in significant hypotension. As the patient was also volume overloaded and short of breath, we were attempting to improve her volume status as well as control her atrial fibrillation. As a result, the Cardizem was discontinued in favor of amiodarone. The first attempt at a cardioversion was successful for no greater than 5 minutes. After this the amiodarone was continued with the addition of 2 doses of digoxin given in two sequential days. Eventually, her heart rate decreased into the 90's, and she was cardioverted again. Following the second cardioversion, she maintained normal sinus rhythm for the 3 days. She was determined to be stable for discharge in normal sinus rhythm.  3. Sternocostal pain Patient stated that she has chronic sternocostal pain that is best relieved with the application of topical treatments such as Voltaren (diclofenac sodium) gel. She denied worsening of the pain with activity unless the activity utilized the chest muscles.   4. Mid-epigastric pain Patient stated that four days prior to admission she began to experience mid-epigastric pain that was worse  with PO potassium and lying flat. The pain was slightly better with sucralfate and sitting. GI cocktail and tums did not relieve her pain. The patient was placed on pantoprazole  prior to admission which was continued. The day of discharge she was placed on famotidine to be taken two times per day with her two largest meals. Please continue this medication as the pain has increased slightly today. We would recommend further revaluation by her PCP on an outpatient basis.   5. Multiple blisters bilateral lower legs The patient presented with multiple blisters bilaterally from the ankle to the knees most likely secondary to the compression wrapping which was placed with the edema. Compression wrappings were withheld for the first 4 days of the admission to permit healing of her blisters. Given the severity of the edema, unna boots were placed consisting of similar compression-type wrapping. Recommend that these wrappings removed daily and with the skin being reviewed for re-accumulation of the blisters.   6. Physical deconditioning  Patient appears to have decreased physical conditioning at baseline. Patient has demonstrated only minor improvements in ambulation since admission. She is able to ambulate from her bed to chair without significant difficulty, however she requires assistance for transfer. Please continue physical therapy as indicated.   7. Hypokalemia  Patient is noted to be hypokalemic with significant diuresis. Patient's potassium was replaced on a daily basis with IV potassium. Recommend that the patient be placed in high potassium diet (bananas, raisins, dates, cantaloupe for example) as she does not tolerate by mouth potassium tablets without significantly increasing the mid epigastric pain. Please continue to monitor her potassium weakly or if she becomes symptomatic or if the patient's dose of diuretic is significantly increased.   8. 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  Stable, as is Hgb. Would advise evaluation as an outpatient.   Discharge Vitals:   BP 108/66 (BP Location: Left Arm)   Pulse (!) 56   Temp 97.8 F (36.6 C) (Oral)   Resp 17   Ht  (1.702 m)   Wt 265 lb 10.5 oz (120.5 kg)  SpO2 96%   BMI 41.61 kg/m   Pertinent Labs, Studies, and Procedures:  CBC Latest Ref Rng & Units 03/08/2017 03/07/2017 03/06/2017  WBC 4.0 - 10.5 K/uL 5.7 4.2 4.9  Hemoglobin 12.0 - 15.0 g/dL 1.6(X) 0.9(U) 8.9(L)  Hematocrit 36.0 - 46.0 % 31.7(L) 30.1(L) 31.3(L)  Platelets 150 - 400 K/uL 136(L) 138(L) 139(L)   CMP Latest Ref Rng & Units 03/09/2017 03/08/2017 03/07/2017  Glucose 65 - 99 mg/dL 93 045(W) 098(J)  BUN 6 - 20 mg/dL 19(J) 47(W) 29(F)  Creatinine 0.44 - 1.00 mg/dL 6.21(H) 0.86(V) 7.84(O)  Sodium 135 - 145 mmol/L 137 138 138  Potassium 3.5 - 5.1 mmol/L 3.7 4.4 3.5  Chloride 101 - 111 mmol/L 97(L) 99(L) 99(L)  CO2 22 - 32 mmol/L 27 30 34(H)  Calcium 8.9 - 10.3 mg/dL 8.9 9.6(E) 9.5(M)  Total Protein 6.5 - 8.1 g/dL 6.1(L) 6.4(L) 5.8(L)  Total Bilirubin 0.3 - 1.2 mg/dL 8.4(X) 3.2(G) 4.0(N)  Alkaline Phos 38 - 126 U/L 81 89 81  AST 15 - 41 U/L ALT 14 - 54 U/L Discharge Instructions: Discharge Instructions    Call MD for:  persistant dizziness or light-headedness    Complete by:  As directed    Diet - low sodium heart healthy    Complete by:  As directed    Increase activity slowly    Complete by:  As directed       Signed: Lanelle Bal, MD 03/09/2017, 1:30 PM   Pager: Pager# [phone]

## 2017-03-09 NOTE — Clinical Social Work Note (Addendum)
CSW continues to follow for discharge needs.  Charlynn Court, CSW [phone]  11:51 am Received call from MD that patient is stable for discharge back to SNF today. CSW notified admissions coordinator and they can take her back today. MD notified and will start working on discharge.  Charlynn Court, CSW [phone]

## 2017-03-09 NOTE — Progress Notes (Signed)
   Subjective: patient was sleeping her bed upon entering the room today. She stated that her midepigastric pain was more severe today than it had been the day prior. Patient was informed that we would alter her medication regimen in an attempt to improve this pain. Patient denies shortness of breath, abdominal pain other than the midepigastric pain, palpitations, or other concerning symptoms. The patient's sternocostal pain is still present on examination today, much improved with Voltaren gel.  Objective:  Vital signs in last 24 hours: Vitals:   03/08/17 1931 03/08/17 2332 03/09/17 0511 03/09/17 0800  BP:   Pulse: (!) 54 (!) 53 (!) 48   Resp: 14 19 (!) 23   Temp: 97.7 F (36.5 C) 97.9 F (36.6 C) 97.8 F (36.6 C) 98.7 F (37.1 C)  TempSrc: Oral Oral Oral Oral  SpO2: 98% 95% 100%   Weight:   265 lb 10.5 oz (120.5 kg)   Height:        ROS negative except as per HPI.  Physical Exam  Constitutional: She appears well-developed and well-nourished. No distress.  HENT:  Head: Normocephalic and atraumatic.  Neck: Neck supple.  Cardiovascular: Normal rate and regular rhythm.   Murmur heard. Pulmonary/Chest: Effort normal. No respiratory distress.  Abdominal: Soft. Bowel sounds are normal. She exhibits no distension. There is tenderness (Mid-epigastric ).  Musculoskeletal: She exhibits edema (bilateral lower distal extremities, +1 pitting) and tenderness.  Neurological: She is alert.  Skin: Skin is warm.  Psychiatric: She has a normal mood and affect.  Nursing note and vitals reviewed.   Assessment/Plan:  Principal Problem:   Acute on chronic diastolic CHF (congestive heart failure), NYHA class 4 (HCC) Active Problems:   S/P AVR (aortic valve replacement)   Atrial fibrillation with rapid ventricular response (HCC)   Physical deconditioning   Multiple blisters both lower legs   Chronic anticoagulation   Sternocostal pain  Acute on chronic diastolic CHF  (congestive heart failure), NYHA class 4 (HCC). -furosemide  IVBID discontinued -torsemide  PO continued   Atrial fibrillation with rapid ventricular response (HCC).  -Amiodarone  PO daily  -Cardiology decreased Metoprolol to 25BID -Continue Xarelto. -Heart rate in 60's today -Please keep HR 55-75, call cards or primary team if it is consistently below 55 and asymptomatic, if symptomatic hold and call primary team. Thank you  -[place] Vasc score of 2 with 4% risk  Hypokalemia: -Potassium 3.7 today -Will continue to monitor and treat as necessary  -Patient doesnot tolerate increased doses of PO potassium as they worsen GI pain significantly   Mid-Epigstric pain and costovertebral pain: -Pain is worsened with increased doses of PO potassium, switched to IV potassium -Voltaren gel continued for costovertebral pain  Microcytic anemia. -Hb =8.9  -Will repeat CBC asindicated   AKI: Cr slightly worsened to 1.43today. -continue daily BMP to monitor function  Hyperbilirubinemia: Chronic, thought to be most likely secondary to hemolysis secondary to the patient artificial valve,  Stable, as is Hgb. Would advise evaluation as an outpatient.   Transaminitis: Resolved   Diet: Heart healthy Fluids: NONE Code: Full DVT ppx: Home Xarelto Dispo: Anticipated discharge in approximately today.   Lanelle Bal, MD 03/09/2017, 11:33 AM Pager: Pager# [phone]

## 2017-03-14 ENCOUNTER — Encounter (HOSPITAL_COMMUNITY): Payer: Self-pay

## 2017-03-14 ENCOUNTER — Emergency Department (HOSPITAL_COMMUNITY): Payer: Medicaid Other

## 2017-03-14 DIAGNOSIS — L03119 Cellulitis of unspecified part of limb: Secondary | ICD-10-CM | POA: Diagnosis not present

## 2017-03-14 DIAGNOSIS — R778 Other specified abnormalities of plasma proteins: Secondary | ICD-10-CM

## 2017-03-14 DIAGNOSIS — R1011 Right upper quadrant pain: Secondary | ICD-10-CM | POA: Diagnosis not present

## 2017-03-14 DIAGNOSIS — R011 Cardiac murmur, unspecified: Secondary | ICD-10-CM | POA: Diagnosis not present

## 2017-03-14 DIAGNOSIS — M25562 Pain in left knee: Secondary | ICD-10-CM

## 2017-03-14 DIAGNOSIS — I340 Nonrheumatic mitral (valve) insufficiency: Secondary | ICD-10-CM

## 2017-03-14 DIAGNOSIS — M1990 Unspecified osteoarthritis, unspecified site: Secondary | ICD-10-CM | POA: Diagnosis present

## 2017-03-14 DIAGNOSIS — I213 ST elevation (STEMI) myocardial infarction of unspecified site: Secondary | ICD-10-CM | POA: Diagnosis not present

## 2017-03-14 DIAGNOSIS — I872 Venous insufficiency (chronic) (peripheral): Secondary | ICD-10-CM | POA: Diagnosis not present

## 2017-03-14 DIAGNOSIS — R0789 Other chest pain: Secondary | ICD-10-CM | POA: Diagnosis present

## 2017-03-14 DIAGNOSIS — Z79899 Other long term (current) drug therapy: Secondary | ICD-10-CM | POA: Diagnosis not present

## 2017-03-14 DIAGNOSIS — I480 Paroxysmal atrial fibrillation: Secondary | ICD-10-CM | POA: Diagnosis not present

## 2017-03-14 DIAGNOSIS — M542 Cervicalgia: Secondary | ICD-10-CM | POA: Diagnosis present

## 2017-03-14 DIAGNOSIS — M25561 Pain in right knee: Secondary | ICD-10-CM | POA: Diagnosis not present

## 2017-03-14 DIAGNOSIS — Z8249 Family history of ischemic heart disease and other diseases of the circulatory system: Secondary | ICD-10-CM

## 2017-03-14 DIAGNOSIS — I5033 Acute on chronic diastolic (congestive) heart failure: Secondary | ICD-10-CM | POA: Diagnosis not present

## 2017-03-14 DIAGNOSIS — Z823 Family history of stroke: Secondary | ICD-10-CM

## 2017-03-14 DIAGNOSIS — I248 Other forms of acute ischemic heart disease: Secondary | ICD-10-CM | POA: Diagnosis not present

## 2017-03-14 DIAGNOSIS — R1013 Epigastric pain: Secondary | ICD-10-CM | POA: Diagnosis not present

## 2017-03-14 DIAGNOSIS — D649 Anemia, unspecified: Secondary | ICD-10-CM | POA: Diagnosis present

## 2017-03-14 DIAGNOSIS — I5032 Chronic diastolic (congestive) heart failure: Secondary | ICD-10-CM | POA: Diagnosis not present

## 2017-03-14 DIAGNOSIS — R17 Unspecified jaundice: Secondary | ICD-10-CM

## 2017-03-14 DIAGNOSIS — I130 Hypertensive heart and chronic kidney disease with heart failure and stage 1 through stage 4 chronic kidney disease, or unspecified chronic kidney disease: Principal | ICD-10-CM | POA: Diagnosis present

## 2017-03-14 DIAGNOSIS — R079 Chest pain, unspecified: Secondary | ICD-10-CM

## 2017-03-14 DIAGNOSIS — I110 Hypertensive heart disease with heart failure: Secondary | ICD-10-CM

## 2017-03-14 DIAGNOSIS — F79 Unspecified intellectual disabilities: Secondary | ICD-10-CM | POA: Diagnosis present

## 2017-03-14 DIAGNOSIS — Z7901 Long term (current) use of anticoagulants: Secondary | ICD-10-CM | POA: Diagnosis not present

## 2017-03-14 DIAGNOSIS — I4901 Ventricular fibrillation: Secondary | ICD-10-CM | POA: Diagnosis present

## 2017-03-14 DIAGNOSIS — Z6841 Body Mass Index (BMI) 40.0 and over, adult: Secondary | ICD-10-CM | POA: Diagnosis not present

## 2017-03-14 DIAGNOSIS — Z953 Presence of xenogenic heart valve: Secondary | ICD-10-CM

## 2017-03-14 DIAGNOSIS — K219 Gastro-esophageal reflux disease without esophagitis: Secondary | ICD-10-CM | POA: Diagnosis present

## 2017-03-14 DIAGNOSIS — I4891 Unspecified atrial fibrillation: Secondary | ICD-10-CM | POA: Diagnosis not present

## 2017-03-14 DIAGNOSIS — N184 Chronic kidney disease, stage 4 (severe): Secondary | ICD-10-CM | POA: Diagnosis not present

## 2017-03-14 DIAGNOSIS — I959 Hypotension, unspecified: Secondary | ICD-10-CM | POA: Diagnosis not present

## 2017-03-14 DIAGNOSIS — G4730 Sleep apnea, unspecified: Secondary | ICD-10-CM | POA: Diagnosis present

## 2017-03-14 DIAGNOSIS — Z833 Family history of diabetes mellitus: Secondary | ICD-10-CM

## 2017-03-14 DIAGNOSIS — K8020 Calculus of gallbladder without cholecystitis without obstruction: Secondary | ICD-10-CM | POA: Diagnosis present

## 2017-03-14 DIAGNOSIS — E8809 Other disorders of plasma-protein metabolism, not elsewhere classified: Secondary | ICD-10-CM | POA: Diagnosis present

## 2017-03-14 DIAGNOSIS — R238 Other skin changes: Secondary | ICD-10-CM | POA: Diagnosis not present

## 2017-03-14 DIAGNOSIS — I472 Ventricular tachycardia: Secondary | ICD-10-CM | POA: Diagnosis not present

## 2017-03-14 DIAGNOSIS — Z952 Presence of prosthetic heart valve: Secondary | ICD-10-CM

## 2017-03-14 DIAGNOSIS — Z800 Family history of malignant neoplasm of digestive organs: Secondary | ICD-10-CM

## 2017-03-14 DIAGNOSIS — Z886 Allergy status to analgesic agent status: Secondary | ICD-10-CM

## 2017-03-14 DIAGNOSIS — N179 Acute kidney failure, unspecified: Secondary | ICD-10-CM

## 2017-03-14 DIAGNOSIS — F99 Mental disorder, not otherwise specified: Secondary | ICD-10-CM

## 2017-03-14 DIAGNOSIS — I208 Other forms of angina pectoris: Secondary | ICD-10-CM | POA: Diagnosis not present

## 2017-03-14 DIAGNOSIS — R748 Abnormal levels of other serum enzymes: Secondary | ICD-10-CM | POA: Diagnosis present

## 2017-03-14 DIAGNOSIS — R7989 Other specified abnormal findings of blood chemistry: Secondary | ICD-10-CM

## 2017-03-14 LAB — BLOOD GAS, ARTERIAL
Acid-Base Excess: 3.2 mmol/L — ABNORMAL HIGH (ref 0.0–2.0)
BICARBONATE: 27.4 mmol/L (ref 20.0–28.0)
Drawn by: 511471
O2 Saturation: 97.2 %
PATIENT TEMPERATURE: 98.6
PCO2 ART: 43.1 mmHg (ref 32.0–48.0)
pH, Arterial: 7.419 (ref 7.350–7.450)
pO2, Arterial: 101 mmHg (ref 83.0–108.0)

## 2017-03-14 LAB — CBC
HCT: 29.7 % — ABNORMAL LOW (ref 36.0–46.0)
HEMOGLOBIN: 8.6 g/dL — AB (ref 12.0–15.0)
MCH: 21 pg — ABNORMAL LOW (ref 26.0–34.0)
MCHC: 29 g/dL — AB (ref 30.0–36.0)
MCV: 72.4 fL — AB (ref 78.0–100.0)
Platelets: 197 10*3/uL (ref 150–400)
RBC: 4.1 MIL/uL (ref 3.87–5.11)
RDW: 16.6 % — AB (ref 11.5–15.5)
WBC: 5.8 10*3/uL (ref 4.0–10.5)

## 2017-03-14 LAB — TROPONIN I
Troponin I: 0.39 ng/mL (ref <0.03)
Troponin I: 0.39 ng/mL (ref <0.03)

## 2017-03-14 LAB — BASIC METABOLIC PANEL
ANION GAP: 14 (ref 5–15)
BUN: 32 mg/dL — AB (ref 6–20)
CHLORIDE: 98 mmol/L — AB (ref 101–111)
CO2: 24 mmol/L (ref 22–32)
Calcium: 8.8 mg/dL — ABNORMAL LOW (ref 8.9–10.3)
Creatinine, Ser: 1.93 mg/dL — ABNORMAL HIGH (ref 0.44–1.00)
GFR calc Af Amer: 30 mL/min — ABNORMAL LOW (ref >60)
GFR calc non Af Amer: 26 mL/min — ABNORMAL LOW (ref >60)
GLUCOSE: 136 mg/dL — AB (ref 65–99)
POTASSIUM: 3.5 mmol/L (ref 3.5–5.1)
Sodium: 136 mmol/L (ref 135–145)

## 2017-03-14 LAB — I-STAT TROPONIN, ED: Troponin i, poc: 0.37 ng/mL (ref 0.00–0.08)

## 2017-03-14 MED ORDER — DICLOFENAC SODIUM 1 % TD GEL
4.0000 g | Freq: Four times a day (QID) | TRANSDERMAL | Status: DC
  Administered 2017-03-14 – 2017-03-15 (×3): 4 g via TOPICAL
  Filled 2017-03-14 (×2): qty 100

## 2017-03-14 MED ORDER — SODIUM CHLORIDE 0.9 % IV BOLUS (SEPSIS)
500.0000 mL | Freq: Once | INTRAVENOUS | Status: AC
  Administered 2017-03-14: 500 mL via INTRAVENOUS

## 2017-03-14 MED ORDER — CITALOPRAM HYDROBROMIDE 10 MG PO TABS
20.0000 mg | ORAL_TABLET | Freq: Every day | ORAL | Status: DC
  Administered 2017-03-15: 20 mg via ORAL
  Filled 2017-03-14: qty 2

## 2017-03-14 MED ORDER — PANTOPRAZOLE SODIUM 40 MG PO TBEC
40.0000 mg | DELAYED_RELEASE_TABLET | Freq: Every day | ORAL | Status: DC
  Administered 2017-03-14 – 2017-03-15 (×2): 40 mg via ORAL
  Filled 2017-03-14 (×2): qty 1

## 2017-03-14 MED ORDER — RIVAROXABAN 20 MG PO TABS
20.0000 mg | ORAL_TABLET | Freq: Every day | ORAL | Status: DC
  Administered 2017-03-14: 20 mg via ORAL
  Filled 2017-03-14: qty 1

## 2017-03-14 MED ORDER — SUCRALFATE 1 GM/10ML PO SUSP
1.0000 g | Freq: Two times a day (BID) | ORAL | Status: DC
  Administered 2017-03-15: 1 g via ORAL
  Filled 2017-03-14: qty 10

## 2017-03-14 MED ORDER — TORSEMIDE 20 MG PO TABS
20.0000 mg | ORAL_TABLET | Freq: Every day | ORAL | Status: DC
  Administered 2017-03-15: 20 mg via ORAL
  Filled 2017-03-14: qty 1

## 2017-03-14 MED ORDER — FAMOTIDINE 20 MG PO TABS
20.0000 mg | ORAL_TABLET | Freq: Two times a day (BID) | ORAL | Status: DC
  Administered 2017-03-14 – 2017-03-15 (×2): 20 mg via ORAL
  Filled 2017-03-14 (×2): qty 1

## 2017-03-14 MED ORDER — ONDANSETRON HCL 4 MG/2ML IJ SOLN
4.0000 mg | Freq: Four times a day (QID) | INTRAMUSCULAR | Status: DC | PRN
  Administered 2017-03-15: 4 mg via INTRAVENOUS
  Filled 2017-03-14: qty 2

## 2017-03-14 MED ORDER — METOPROLOL TARTRATE 25 MG PO TABS
25.0000 mg | ORAL_TABLET | Freq: Two times a day (BID) | ORAL | Status: DC
  Administered 2017-03-14 – 2017-03-15 (×2): 25 mg via ORAL
  Filled 2017-03-14 (×2): qty 1

## 2017-03-14 MED ORDER — SODIUM CHLORIDE 0.9 % IV SOLN: INTRAVENOUS | Status: DC

## 2017-03-14 MED ORDER — TRAMADOL HCL 50 MG PO TABS
100.0000 mg | ORAL_TABLET | Freq: Two times a day (BID) | ORAL | Status: DC
  Administered 2017-03-14 – 2017-03-15 (×2): 100 mg via ORAL
  Filled 2017-03-14 (×2): qty 2

## 2017-03-14 MED ORDER — AMIODARONE HCL 200 MG PO TABS
200.0000 mg | ORAL_TABLET | Freq: Every day | ORAL | Status: DC
  Administered 2017-03-15: 200 mg via ORAL
  Filled 2017-03-14: qty 1

## 2017-03-14 MED ORDER — ASPIRIN 81 MG PO CHEW: 324.0000 mg | CHEWABLE_TABLET | Freq: Once | ORAL | Status: DC

## 2017-03-14 NOTE — ED Notes (Signed)
Patient transported to X-ray 

## 2017-03-14 NOTE — ED Triage Notes (Signed)
Pt arrives [facility] from [facility] with c/o CP and SHOB this am. MOnitor indicates Afib with hx of same cardioverted for same 1 week ago. Given nitro x 3, Aspirin 324 and morphine 4 mg iv PTA. Pain free at present.

## 2017-03-14 NOTE — ED Notes (Signed)
Helped patient get undress into a gown on the monitor patient is resting with call bell in reach

## 2017-03-14 NOTE — ED Notes (Signed)
Pt returns from xray. Denies chest pain refuses blanket

## 2017-03-14 NOTE — Progress Notes (Deleted)
Physical Exam

## 2017-03-14 NOTE — ED Provider Notes (Signed)
[facility]-EMERGENCY DEPT Provider Note   CSN: [id number] Arrival date & time: 04-07-17  1104     History   Chief Complaint Chief Complaint  Patient presents with  . Shortness of Breath  . Chest Pain    HPI Jennifer Bauer is a 66 y.o. female.  She presents for evaluation today of chest pain and shortness of breath.  At discharge from the hospital 5 days ago after treatment for congestive heart failure, atrial fibrillation, tachycardia and hypotension.  She was cardioverted twice, and apparently was discharged in normal sinus rhythm, on amiodarone for rate control.  The patient also complains of a sensation of rapid heartbeat, which started this morning.  She reports that her chest pain has been present each morning for the last 2 days.  The pain has lasted 2 hours each morning, then resolved spontaneously.  Currently she is not having chest pain.  She did not have any chest pain, for 3 days after discharge from the hospital.  She has had decreased appetite but has been able to eat.  She states that she is taking her medications, as prescribed.  She denies cough, vomiting, focal weakness or paresthesia.  There are no other known modifying factors.  HPI  Past Medical History:  Diagnosis Date  . Acute on chronic diastolic CHF (congestive heart failure), NYHA class 4 (HCC) 02/22/2017  . Aortic insufficiency and aortic stenosis    s/p Tissue AVR with Dr. Cornelius Moras 03/2011; preop LHC with normal cors;  pre op carotid dopplers neg for ICA stenosis  . Arthritis   . Atrial fibrillation (HCC)    post op after AVR 10/12  . HTN (hypertension)   . Mitral regurgitation   . Multiple blisters both lower legs 02/22/2017  . SVT (supraventricular tachycardia) [facility])     Patient Active Problem List   Diagnosis Date Noted  . Midepigastric pain 03/09/2017  . Hypokalemia 03/09/2017  . Sternocostal pain 03/02/2017  . Acute on chronic diastolic CHF (congestive heart failure), NYHA class 4 (HCC) 02/22/2017  .  Multiple blisters both lower legs 02/22/2017  . Chronic anticoagulation 02/22/2017  . Chronic diastolic congestive heart failure (HCC) 10/27/2016  . Physical deconditioning 06/28/2016  . Atrial fibrillation with rapid ventricular response (HCC) 06/19/2016  . PAF (paroxysmal atrial fibrillation) (HCC) 04/18/2011  . Mitral regurgitation   . HTN (hypertension)   . Tachycardia   . Arthritis   . S/P AVR (aortic valve replacement) 03/30/2011    Past Surgical History:  Procedure Laterality Date  . AORTIC VALVE REPLACEMENT  03/30/2011   #90mm [facility] Ease pericardial tissue valve  . CARDIOVERSION N/A 08/24/2016   Procedure: CARDIOVERSION;  Surgeon: Pricilla Riffle, MD;  Location: [facility] ENDOSCOPY;  Service: Cardiovascular;  Laterality: N/A;  . CARDIOVERSION N/A 02/26/2017   Procedure: CARDIOVERSION;  Surgeon: Chilton Si, MD;  Location: [facility] ENDOSCOPY;  Service: Cardiovascular;  Laterality: N/A;  . CARDIOVERSION N/A 03/06/2017   Procedure: CARDIOVERSION;  Surgeon: Lewayne Bunting, MD;  Location: [facility] ENDOSCOPY;  Service: Cardiovascular;  Laterality: N/A;  . hysterctomy    . TONSILLECTOMY      OB History    No data available       Home Medications    Prior to Admission medications   Medication Sig Start Date End Date Taking? Authorizing Provider  amiodarone (PACERONE) 200 MG tablet Take 1 tablet (200 mg total) by mouth daily. 03/10/17   Lanelle Bal, MD  citalopram (CELEXA) 20 MG tablet Take 20 mg by mouth daily.  [provider]  diclofenac sodium (VOLTAREN) 1 % GEL Apply 4 g topically 4 (four) times daily. 03/09/17   Lanelle Bal, MD  famotidine (PEPCID) 20 MG tablet Take 1 tablet (20 mg total) by mouth 2 (two) times daily with a meal. 03/09/17   Lanelle Bal, MD  metoprolol tartrate (LOPRESSOR) 25 MG tablet Take 1 tablet (25 mg total) by mouth 2 (two) times daily. 03/09/17   Lanelle Bal, MD  pantoprazole (PROTONIX) 40 MG tablet Take 1 tablet (40  mg total) by mouth daily. 03/09/17   Lanelle Bal, MD  rivaroxaban (XARELTO) 20 MG TABS tablet Take 20 mg by mouth daily.    [provider]  silver sulfADIAZINE (SILVADENE) 1 % cream Apply 1 application topically daily. Blisters on her legs    [provider]  sucralfate (CARAFATE) 1 GM/10ML suspension Take 10 mLs (1 g total) by mouth 2 (two) times daily before a meal. 03/09/17   Lanelle Bal, MD  torsemide (DEMADEX) 20 MG tablet Take 2 tablets (40 mg total) by mouth daily. 03/10/17   Lanelle Bal, MD  traMADol (ULTRAM) 50 MG tablet Take 100 mg by mouth 2 (two) times daily.     [provider]    Family History Family History  Problem Relation Age of Onset  . Heart attack Father   . Stroke Father   . Diabetes Father   . Colon cancer Sister        heavy smoker  . Diabetes Sister     Social History Social History  Substance Use Topics  . Smoking status: Never Smoker  . Smokeless tobacco: Never Used  . Alcohol use No     Allergies   Acetaminophen   Review of Systems Review of Systems  All other systems reviewed and are negative.    Physical Exam Updated Vital Signs BP 99/77   Pulse 96   Temp 98.7 F (37.1 C) (Oral)   Resp 19   Ht  (1.702 m)   Wt 120.2 kg (265 lb)   SpO2 94%   BMI 41.50 kg/m   Physical Exam  Constitutional: She is oriented to person, place, and time. She appears well-developed.  Morbidly obese  HENT:  Head: Normocephalic and atraumatic.  Eyes: Pupils are equal, round, and reactive to light. Conjunctivae and EOM are normal.  Neck: Normal range of motion and phonation normal. Neck supple.  Cardiovascular: Normal rate and regular rhythm.   Pulmonary/Chest: Effort normal. No respiratory distress. She has no rales. She exhibits no tenderness.  Few scattered wheezes.  Abdominal: Soft. She exhibits no distension. There is no tenderness. There is no guarding.  Musculoskeletal: Normal range of motion.  She exhibits edema (Bilateral lower legs, which are wrapped with Ace wrap). She exhibits no deformity.  Neurological: She is alert and oriented to person, place, and time. She exhibits normal muscle tone.  Skin: Skin is warm and dry.  Psychiatric: She has a normal mood and affect. Her behavior is normal.  Nursing note and vitals reviewed.    ED Treatments / Results  Labs (all labs ordered are listed, but only abnormal results are displayed) Labs Reviewed  BASIC METABOLIC PANEL - Abnormal; Notable for the following:       Result Value   Chloride 98 (*)    Glucose, Bld 136 (*)    BUN 32 (*)    Creatinine, Ser 1.93 (*)    Calcium 8.8 (*)    GFR calc non Af Amer 26 (*)  GFR calc Af Amer 30 (*)    All other components within normal limits  CBC - Abnormal; Notable for the following:    Hemoglobin 8.6 (*)    HCT 29.7 (*)    MCV 72.4 (*)    MCH 21.0 (*)    MCHC 29.0 (*)    RDW 16.6 (*)    All other components within normal limits  I-STAT TROPONIN, ED - Abnormal; Notable for the following:    Troponin i, poc 0.37 (*)    All other components within normal limits    EKG  EKG Interpretation  Date/Time:  Wednesday March 14 2017 11:10:37 EDT Ventricular Rate:  108 PR Interval:    QRS Duration: 106 QT Interval:  335 QTC Calculation: 449 R Axis:   21 Text Interpretation:  Atrial fibrillation Ventricular premature complex Anterior infarct, old Repol abnrm suggests ischemia, anterolateral Since last tracing Atrial fibrillation is new Confirmed by Mancel Bale [phone]) on 03/08/2017 11:22:24 AM      CHA2DS2/VAS Stroke Risk Points      3 >= 2 Points: High Risk  1 - 1.99 Points: Medium Risk  0 Points: Low Risk    The patient's score has not changed in the past year.:  No Change         Details    Note: External data might be a factor in metrics not marked with    Points Metrics   This score determines the patient's risk of having a stroke if the  patient has atrial  fibrillation.       1 Has Congestive Heart Failure:  Yes   0 Has Vascular Disease:  No   1 Has Hypertension:  Yes   0 Age:  43   0 Has Diabetes:  No   0 Had Stroke:  No Had TIA:  No Had thromboembolism:  No   1 Female:  Yes         Radiology Dg Chest 2 View  Result Date: 03/09/2017 CLINICAL DATA:  Shortness of breath and chest pain starting this morning EXAM: CHEST  2 VIEW COMPARISON:  March 01, 2017 FINDINGS: The heart size is enlarged. Patient status post prior median sternotomy. Mediastinal contour is unchanged. There is mild scar of the left mid lung. Stable calcified granuloma is identified in the left upper lobe unchanged. Mild patchy consolidation of right lung base with probable small right pleural effusion are noted. The bony structures are stable. IMPRESSION: Mild patchy consolidation of right lung base with probable small right pleural effusion noted. Pneumonia is not excluded. Electronically Signed   By: Sherian Rein M.D.   On: 02/26/2017 12:15    Procedures Procedures (including critical care time)  Medications Ordered in ED Medications  sodium chloride 0.9 % bolus 500 mL (not administered)  0.9 %  sodium chloride infusion (not administered)  aspirin chewable tablet 324 mg (324 mg Oral Not Given 02/27/2017 1232)     Initial Impression / Assessment and Plan / ED Course  I have reviewed the triage vital signs and the nursing notes.  Pertinent labs & imaging results that were available during my care of the patient were reviewed by me and considered in my medical decision making (see chart for details).  Clinical Course as of Mar 14 1233  Wed Mar 14, 2017  1141 The patient arrived hypotensive, therefore she will be treated with intravenous fluids, prior to consideration for additional rate, or cardioversion.  [EW]  1142 Low BP: 94/82 [EW]  1215 High Troponin i, poc: (!!) 0.37 [EW]  1215 Low Chloride: (!) 98 [EW]  1216 High Glucose: (!) 136 [EW]  1216 High BUN:  (!) 32 [EW]  1216 Calcium: (!) 8.8 [EW]  1216 High Creatinine: (!) 1.93 [EW]  1216 Low  [EW]  1230 Small right pleural effusion DG Chest 2 View [EW]  1230 Elevated troponin, with unchanged EKG from prior EKG, will cover with aspirin for possible unstable angina.  Possible rate related troponin leak.  Patient will require admission for stabilization and further evaluation by cardiology.  [EW]    Clinical Course User Index [EW] Mancel Bale, MD   BUN  Date Value Ref Range Status  03/29/17 32 (H) 6 - 20 mg/dL Final  16/03/9603 30 (H) 6 - 20 mg/dL Final  54/02/8118 30 (H) 6 - 20 mg/dL Final  14/78/2956 28 (H) 6 - 20 mg/dL Final  21/30/8657 31 (H) 8 - 27 mg/dL Final  84/69/6295 46 (H) 8 - 27 mg/dL Final  28/41/3244 48 (H) 8 - 27 mg/dL Final   Creatinine, Ser  Date Value Ref Range Status  2017-03-29 1.93 (H) 0.44 - 1.00 mg/dL Final  06/21/7251 6.64 (H) 0.44 - 1.00 mg/dL Final  40/34/7425 9.56 (H) 0.44 - 1.00 mg/dL Final  38/75/6433 2.95 (H) 0.44 - 1.00 mg/dL Final   Above values indicate mild AKI, associated with shortness of breath, and tachycardia.   Patient Vitals for the past 24 hrs:  BP Temp Temp src Pulse Resp SpO2 Height Weight  March 29, 2017 1230 99/77 - - 96 19 94 % - -  2017/03/29 1215 104/81 - - 96 (!) 24 92 % - -  2017/03/29 1200 - - - (!) 110 20 95 % - -  03-29-17 1145 91/73 - - (!) 117 (!) 24 96 % - -  Mar 29, 2017 1130 100/83 - - 95 19 94 % - -  2017-03-29 1117 - - - - - -  (1.702 m) 120.2 kg (265 lb)  2017-03-29 1116 - - - - - 95 % - -  Mar 29, 2017 1115 95/79 - - (!) 118 (!) 23 93 % - -  29-Mar-2017 1110 94/82 98.7 F (37.1 C) Oral (!) 117 (!) 21 94 % - -  03/29/2017 1104 - - - - - 98 % - -   Call placed to cardiology for consultation-12: 28-will see, 12: 55  Call placed to medical service for admission-1: 30 p.m. medicine will see the patient and admit   1:32 PM Reevaluation with update and discussion. After initial assessment and treatment, an updated evaluation reveals  clinical status is unchanged.  Findings discussed with the patient. Robin Pafford L   CRITICAL CARE Performed by: Mancel Bale L Total critical care time: 35 minutes Critical care time was exclusive of separately billable procedures and treating other patients. Critical care was necessary to treat or prevent imminent or life-threatening deterioration. Critical care was time spent personally by me on the following activities: development of treatment plan with patient and/or surrogate as well as nursing, discussions with consultants, evaluation of patient's response to treatment, examination of patient, obtaining history from patient or surrogate, ordering and performing treatments and interventions, ordering and review of laboratory studies, ordering and review of radiographic studies, pulse oximetry and re-evaluation of patient's condition.   Final Clinical Impressions(s) / ED Diagnoses   Final diagnoses:  Atrial fibrillation with RVR (HCC)  Elevated troponin  AKI (acute kidney injury) (HCC)    Recurrent atrial fibrillation, with tachycardia now.  Mild troponin  elevation, nonspecific.  EKG unchanged from prior.  Patient will require admission for further evaluation, treatment and stabilization.  Nursing Notes Reviewed/ Care Coordinated Applicable Imaging Reviewed Interpretation of Laboratory Data incorporated into ED treatment  Plan: Admit    Mancel Bale, MD 02/27/2017 [phone]

## 2017-03-14 NOTE — ED Notes (Signed)
Dr. Effie Shy made aware of pain return and repeat EKLG and bolus verble order.

## 2017-03-14 NOTE — ED Notes (Signed)
Admitting md at bedside

## 2017-03-14 NOTE — Consult Note (Addendum)
The patient has been seen in conjunction with Laverda Page, NP-C. All aspects of care have been considered and discussed. The patient has been personally interviewed, examined, and all clinical data has been reviewed.   Significant clinical issues including paroxysmal A. fib, hypertension, bradycardia, lower extremity cellulitis, and chronic diastolic heart failure that acutely decompensates when atrial fibrillation was present.  Returns today with recurrence of atrial fibrillation and evidence of volume overload perhaps in the setting of recurrent infection. Atrial fibrillation has filled aggressive attempts at rhythm control. May need EP opinion, but rate control and adjustment of diuretic therapy may be appropriate for the cardioversion/antiarrhythmic agents. For now, continue amiodarone and consider IV diltiazem for rate control if needed.  Elevated troponin is likely demand related.  Significant anemia although not at a point where transfusion would be necessary.  Very lethargic, rule out CO2 narcosis. ABG ordered.  Cardiology Consult    Patient ID: KARISTA AISPURO MRN: [id number], DOB/AGE: 10/27/1950   Admit date: 02/17/2017 Date of Consult: 03/17/2017  Primary Physician: Lonie Peak, PA-C Primary Cardiologist: Tenny Craw Requesting Provider: Effie Shy Reason for Consultation: Afib  CHARISA TWITTY is a 66 y.o. female who is being seen today for the evaluation of Afib at the request of Dr. Effie Shy.   Patient Profile    66 yo female with PMH of HTN, AVR ('12), PAF, Bradycardia, LE cellulitis, chronic diastolic HF who presented with chest pain and found to be in Afib.   Past Medical History   Past Medical History:  Diagnosis Date  . Acute on chronic diastolic CHF (congestive heart failure), NYHA class 4 (HCC) 02/22/2017  . Aortic insufficiency and aortic stenosis    s/p Tissue AVR with Dr. Cornelius Moras 03/2011; preop LHC with normal cors;  pre op carotid dopplers neg for ICA stenosis    . Arthritis   . Atrial fibrillation (HCC)    post op after AVR 10/12  . HTN (hypertension)   . Mitral regurgitation   . Multiple blisters both lower legs 02/22/2017  . SVT (supraventricular tachycardia) (HCC)     Past Surgical History:  Procedure Laterality Date  . 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 CARDIOVERSION N/A 08/24/2016   Procedure: CARDIOVERSION;  Surgeon: Pricilla Riffle, MD;  Location: [facility] ENDOSCOPY;  Service: Cardiovascular;  Laterality: N/A;  . CARDIOVERSION N/A 02/26/2017   Procedure: CARDIOVERSION;  Surgeon: Chilton Si, MD;  Location: [facility] ENDOSCOPY;  Service: Cardiovascular;  Laterality: N/A;  . CARDIOVERSION N/A 03/06/2017   Procedure: CARDIOVERSION;  Surgeon: Lewayne Bunting, MD;  Location: [facility] ENDOSCOPY;  Service: Cardiovascular;  Laterality: N/A;  . hysterctomy    . TONSILLECTOMY       Allergies  Allergies  Allergen Reactions  . Acetaminophen     tachycardia    History of Present Illness    Mrs. Ackley is a 66 yo female with PMH of HTN, AVR ('12), PAF, Bradycardia, LE cellulitis, and chronic diastolic HF. She is followed by Dr. Tenny Craw as an outpatient. Had cath prior to AVR in 2012 that showed no CAD. She was recently admitted during 9/6-9/21 with CHF and Afib. During this admission she was diuresis a total of 7 L. Also underwent DCCV and failed, but scheduled several days later and was able to maintain SR. Her home medications were adjusted and was discharged on amiodarone  daily, metoprolol  BID, Xarelto  daily and torsemide  daily. Echo that admission showed EF of 55-60%  with no WMA. AV noted, with mild-moderate regurg, and PA pressure of .   She presented back to the ED today from SNF with feelings of palpitations, chest pain and shortness of breath. States her feet feel more swollen than normal, but denies any orthopnea, abd distension or PND. She is somewhat of a difficult historian  and lethargic on exam. In the ED her labs showed stable electrolytes, Cr 1.9 (1.0~1.3), Hgb 8.6, POC trop 0.37. CXR showed with consolidation in the right lung base with small effusion. EKG showed Afib.   Inpatient Medications    . aspirin  324 mg Oral Once    Family History    Family History  Problem Relation Age of Onset  . Heart attack Father   . Stroke Father   . Diabetes Father   . Colon cancer Sister        heavy smoker  . Diabetes Sister     Social History    Social History   Social History  . Marital status: Single    Spouse name: N/A  . Number of children: 0  . Years of education: N/A   Occupational History  . Retired    Social History Main Topics  . Smoking status: Never Smoker  . Smokeless tobacco: Never Used  . Alcohol use No  . Drug use: No  . Sexual activity: No   Other Topics Concern  . Not on file   Social History Narrative   Currently in [facility], [facility], possibly Assisted Living with her sister.     Review of Systems    See HPI  All other systems reviewed and are otherwise negative except as noted above.  Physical Exam    Blood pressure 108/83, pulse (!) 119, temperature 98.7 F (37.1 C), temperature source Oral, resp. rate 18, height  (1.702 m), weight 265 lb (120.2 kg), SpO2 94 %.  General: Pleasant, obese older WF NAD Psych: Lethargic. Neuro: Alert and oriented X 3. Moves all extremities spontaneously. HEENT: Normal  Neck: Supple without bruits or JVD. Lungs:  Resp regular, but labored, Diminished bilaterally. Heart: Irreg Irreg no s3, s4, 2/6 systolic murmur. Abdomen: Soft, non-tender, non distended, BS + x 4.  Extremities: No clubbing, cyanosis. 1+ pitting edema noted to bilateral knees, with ace wraps from the knees down.   Labs    Troponin [facility] of Care Test)  Recent Labs  03/08/2017 1136  TROPIPOC 0.37*   No results for input(s): CKTOTAL, CKMB, TROPONINI in the last 72 hours. Lab Results    Component Value Date   WBC 5.8 02/28/2017   HGB 8.6 (L) 02/25/2017   HCT 29.7 (L) 03/18/2017   MCV 72.4 (L) 02/20/2017   PLT 197 03/07/2017    Recent Labs Lab 03/09/17 0235 03/17/2017 1128  NA 137 136  K 3.7 3.5  CL 97* 98*  CO2 27 24  BUN 30* 32*  CREATININE 1.43* 1.93*  CALCIUM 8.9 8.8*  PROT 6.1*  --   BILITOT 1.8*  --   ALKPHOS 81  --   ALT 18  --   AST 18  --   GLUCOSE 93 136*   Lab Results  Component Value Date   CHOL 172 09/06/2011   HDL 51.70 09/06/2011   LDLCALC 87 09/06/2011   TRIG 169.0 (H) 09/06/2011   No results found for: [facility]   Radiology Studies    Dg Chest 2 View  Result Date: 03/03/2017 CLINICAL DATA:  Shortness of breath  and chest pain starting this morning EXAM: CHEST  2 VIEW COMPARISON:  March 01, 2017 FINDINGS: The heart size is enlarged. Patient status post prior median sternotomy. Mediastinal contour is unchanged. There is mild scar of the left mid lung. Stable calcified granuloma is identified in the left upper lobe unchanged. Mild patchy consolidation of right lung base with probable small right pleural effusion are noted. The bony structures are stable. IMPRESSION: Mild patchy consolidation of right lung base with probable small right pleural effusion noted. Pneumonia is not excluded. Electronically Signed   By: Sherian Rein M.D.   On: 03/08/2017 12:15   Dg Chest Port 1 View  Result Date: 03/01/2017 CLINICAL DATA:  Shortness of breath. EXAM: PORTABLE CHEST 1 VIEW COMPARISON:  Radiographs February 27, 2017. FINDINGS: Stable cardiomegaly. Sternotomy wires are noted. No pneumothorax or significant pleural effusion is noted. Stable right basilar atelectasis or infiltrate is noted. Stable left midlung scarring is noted. Stable calcified granuloma is noted in left upper lobe. Sternotomy wires are noted. Bony thorax is unremarkable. IMPRESSION: Stable right basilar opacity consistent with atelectasis or infiltrate. Electronically Signed   By: Lupita Raider, M.D.   On: 03/01/2017 12:24   Dg Chest Port 1 View  Result Date: 02/27/2017 CLINICAL DATA:  Dyspnea today EXAM: PORTABLE CHEST 1 VIEW COMPARISON:  02/22/2017 CXR FINDINGS: Chronic stable cardiomegaly with aortic valvular replacement and aortic atherosclerosis. Confluent airspace opacity in the right lower lobe raise concern for brain pneumonic consolidation and/or pleural effusion. Mild vascular congestion is noted with calcified granuloma in the left upper lobe. IMPRESSION: 1. Stable cardiomegaly with aortic atherosclerosis. Status post aortic valvular replacement. 2. New confluent opacity at the right lung base obscuring the right hemidiaphragm and portions of the right heart border. Findings likely reflect atelectasis and/or pneumonia. Superimposed effusion is likewise not excluded. 3. Mild vascular congestion. 4. Left upper lobe calcified nodule consistent with a granuloma. Electronically Signed   By: Tollie Eth M.D.   On: 02/27/2017 15:52   Dg Chest Port 1 View  Result Date: 02/22/2017 CLINICAL DATA:  Chest pain and shortness of breath over the last few days. Weakness. EXAM: PORTABLE CHEST 1 VIEW COMPARISON:  06/20/2016 FINDINGS: Previous median sternotomy and aortic valve replacement. Mild cardiomegaly. Aortic atherosclerosis. The lungs are clear. No edema. Chronic calcified granuloma on the left as seen previously. No significant bone finding. No effusions. IMPRESSION: No active disease by radiography. Chronic cardiomegaly and aortic atherosclerosis. No pulmonary edema or pleural effusions. Electronically Signed   By: Paulina Fusi M.D.   On: 02/22/2017 10:55    ECG & Cardiac Imaging    EKG: Afib  Echo: 02/23/17  Study Conclusions  - Left ventricle: The cavity size was normal. Wall thickness was   increased in a pattern of moderate LVH. Systolic function was   normal. The estimated ejection fraction was in the range of 55%   to 60%. Wall motion was normal; there were no regional  wall   motion abnormalities. - Aortic valve: A bioprosthesis was present. There was moderate   stenosis. Valve area (VTI): 0.57 cm^2. Valve area (Vmax): 0.68   cm^2. Valve area (Vmean): 0.62 cm^2. - Mitral valve: Mildly calcified annulus. There was mild   regurgitation. - Left atrium: The atrium was moderately dilated. - Tricuspid valve: There was mild-moderate regurgitation. - Pulmonary arteries: Systolic pressure was moderately increased.   PA peak pressure: 55 mm Hg (S).  Assessment & Plan    66 yo female with PMH  of HTN, AVR ('12), PAF, Bradycardia, LE cellulitis, chronic diastolic HF who presented with chest pain and found to be in Afib.   1. Chest pain: Difficult to elicit details surrounding chest pain features, but reports it has been intermittent for the past couple of days along with palpitations and dyspnea. Noted to have a normal cath in 2012 prior to AVR. Initial troponin 0.37. Recent echo with normal EF.  -- cycle enzymes -- could be demand ischemia in the setting of elevated HR and CKD. Consider further ischemic testing if troponin continues to rise.    2. Afib: Hx of PAF with recent admission requiring 2 DCCVs with second one successful. Has been on Xarelto, amiodarone, and metoprolol prior to admission. Left atrium moderately dilated on echo. Maybe difficult to keep in SR.     3. Acute on chronic diastolic HF: Dyspneic at rest currently. Was on on torsemide  daily at home. CXR with small right pleural effusion. Suspect may need more diuresis this admission.  -- check BNP  4. AVR: noted at mild/moderate regurg on last echo 02/23/17. Stable on exam.    Signed, Laverda Page, NP-C Pager [phone] Mar 25, 2017, 1:58 PM

## 2017-03-15 ENCOUNTER — Inpatient Hospital Stay (HOSPITAL_COMMUNITY): Payer: Medicaid Other

## 2017-03-15 ENCOUNTER — Inpatient Hospital Stay (HOSPITAL_COMMUNITY): Payer: Medicaid Other | Admitting: Certified Registered"

## 2017-03-15 ENCOUNTER — Other Ambulatory Visit (HOSPITAL_COMMUNITY): Payer: Self-pay

## 2017-03-15 DIAGNOSIS — R748 Abnormal levels of other serum enzymes: Secondary | ICD-10-CM

## 2017-03-15 DIAGNOSIS — I5033 Acute on chronic diastolic (congestive) heart failure: Secondary | ICD-10-CM

## 2017-03-15 DIAGNOSIS — R1011 Right upper quadrant pain: Secondary | ICD-10-CM

## 2017-03-15 DIAGNOSIS — I208 Other forms of angina pectoris: Secondary | ICD-10-CM

## 2017-03-15 LAB — BILIRUBIN, FRACTIONATED(TOT/DIR/INDIR)
BILIRUBIN INDIRECT: 2.7 mg/dL — AB (ref 0.3–0.9)
Bilirubin, Direct: 1.2 mg/dL — ABNORMAL HIGH (ref 0.1–0.5)
Total Bilirubin: 3.9 mg/dL — ABNORMAL HIGH (ref 0.3–1.2)

## 2017-03-15 LAB — COMPREHENSIVE METABOLIC PANEL
ALT: 37 U/L (ref 14–54)
ANION GAP: 15 (ref 5–15)
AST: 45 U/L — AB (ref 15–41)
Albumin: 3.1 g/dL — ABNORMAL LOW (ref 3.5–5.0)
Alkaline Phosphatase: 82 U/L (ref 38–126)
BILIRUBIN TOTAL: 2.9 mg/dL — AB (ref 0.3–1.2)
BUN: 36 mg/dL — AB (ref 6–20)
CO2: 25 mmol/L (ref 22–32)
Calcium: 8.7 mg/dL — ABNORMAL LOW (ref 8.9–10.3)
Chloride: 97 mmol/L — ABNORMAL LOW (ref 101–111)
Creatinine, Ser: 2.01 mg/dL — ABNORMAL HIGH (ref 0.44–1.00)
GFR calc Af Amer: 29 mL/min — ABNORMAL LOW (ref >60)
GFR calc non Af Amer: 25 mL/min — ABNORMAL LOW (ref >60)
GLUCOSE: 134 mg/dL — AB (ref 65–99)
POTASSIUM: 3.4 mmol/L — AB (ref 3.5–5.1)
Sodium: 137 mmol/L (ref 135–145)
Total Protein: 6 g/dL — ABNORMAL LOW (ref 6.5–8.1)

## 2017-03-15 LAB — CBC
HEMATOCRIT: 29.6 % — AB (ref 36.0–46.0)
HEMOGLOBIN: 8.8 g/dL — AB (ref 12.0–15.0)
MCH: 21.5 pg — AB (ref 26.0–34.0)
MCHC: 29.7 g/dL — AB (ref 30.0–36.0)
MCV: 72.4 fL — AB (ref 78.0–100.0)
Platelets: 193 10*3/uL (ref 150–400)
RBC: 4.09 MIL/uL (ref 3.87–5.11)
RDW: 17.3 % — AB (ref 11.5–15.5)
WBC: 6.4 10*3/uL (ref 4.0–10.5)

## 2017-03-15 LAB — TROPONIN I: Troponin I: 0.47 ng/mL (ref <0.03)

## 2017-03-15 LAB — GLUCOSE, CAPILLARY
GLUCOSE-CAPILLARY: 118 mg/dL — AB (ref 65–99)
GLUCOSE-CAPILLARY: 77 mg/dL (ref 65–99)
Glucose-Capillary: 153 mg/dL — ABNORMAL HIGH (ref 65–99)

## 2017-03-15 LAB — DIRECT ANTIGLOBULIN TEST (NOT AT ARMC)
DAT, COMPLEMENT: NEGATIVE
DAT, IGG: NEGATIVE

## 2017-03-15 LAB — SAVE SMEAR

## 2017-03-15 LAB — MRSA PCR SCREENING: MRSA by PCR: NEGATIVE

## 2017-03-15 MED ORDER — EPINEPHRINE PF 1 MG/ML IJ SOLN
0.5000 ug/min | INTRAVENOUS | Status: DC
  Filled 2017-03-15: qty 4

## 2017-03-15 MED ORDER — SODIUM CHLORIDE 0.9% FLUSH: 10.0000 mL | INTRAVENOUS | Status: DC | PRN

## 2017-03-15 MED ORDER — AMIODARONE HCL IN DEXTROSE 360-4.14 MG/200ML-% IV SOLN: 30.0000 mg/h | INTRAVENOUS | Status: DC

## 2017-03-15 MED ORDER — FUROSEMIDE 10 MG/ML IJ SOLN: 40.0000 mg | Freq: Four times a day (QID) | INTRAMUSCULAR | Status: DC

## 2017-03-15 MED ORDER — FUROSEMIDE 10 MG/ML IJ SOLN: 40.0000 mg | Freq: Two times a day (BID) | INTRAMUSCULAR | Status: DC

## 2017-03-15 MED ORDER — POTASSIUM CHLORIDE 10 MEQ/100ML IV SOLN
10.0000 meq | INTRAVENOUS | Status: AC
  Administered 2017-03-15: 5 meq via INTRAVENOUS
  Administered 2017-03-15: 10 meq via INTRAVENOUS
  Filled 2017-03-15: qty 100

## 2017-03-15 MED ORDER — POTASSIUM CHLORIDE CRYS ER 20 MEQ PO TBCR: 20.0000 meq | EXTENDED_RELEASE_TABLET | Freq: Once | ORAL | Status: DC

## 2017-03-15 MED ORDER — AMIODARONE LOAD VIA INFUSION
150.0000 mg | Freq: Once | INTRAVENOUS | Status: DC
  Filled 2017-03-15: qty 83.34

## 2017-03-15 MED ORDER — POTASSIUM CHLORIDE 10 MEQ/100ML IV SOLN: 10.0000 meq | INTRAVENOUS | Status: AC

## 2017-03-15 MED ORDER — AMIODARONE HCL IN DEXTROSE 360-4.14 MG/200ML-% IV SOLN: 60.0000 mg/h | INTRAVENOUS | Status: DC

## 2017-03-15 MED ORDER — NITROGLYCERIN 0.4 MG SL SUBL
0.4000 mg | SUBLINGUAL_TABLET | SUBLINGUAL | Status: DC | PRN
  Filled 2017-03-15: qty 1

## 2017-03-15 MED ORDER — AMIODARONE HCL IN DEXTROSE 360-4.14 MG/200ML-% IV SOLN: 60.0000 mg/h | INTRAVENOUS | Status: AC

## 2017-03-16 MED FILL — Medication: Qty: 1 | Status: AC

## 2017-03-19 NOTE — Progress Notes (Signed)
Orthopedic Tech Progress Note Patient Details:  Jennifer Bauer Apr 05, 1951 [id number]  Ortho Devices Type of Ortho Device: Roland Rack boot Ortho Device/Splint Location: (B) LE Ortho Device/Splint Interventions: Ordered, Application   Jennye Moccasin 03/02/2017, 1:57 PM

## 2017-03-19 NOTE — H&P (Deleted)
   Date: 03/06/2017               Patient Name:  Jennifer Bauer MRN: [id number]  DOB: 07/13/1950 Age / Sex: 66 y.o., female   PCP: Conroy, Nathan, PA-C         Medical Service: Internal Medicine Teaching Service         Attending Physician: Dr. Mullen, Emily B, MD    First Contact: Dr. Johncharles Fusselman Pager: [phone]  Second Contact: Dr. Amin Pager: [phone]       After Hours (After 5p/  First Contact Pager: [phone]  weekends / holidays): Second Contact Pager: [phone]   Chief Complaint: "chest pain and I can't breath"  History of Present Illness: Jennifer Bauer is a 66-year-old female who presented with a 1 day history of chest pain, palpitations, shortness of breath and generalized fatigue. She's a past medical history of A. Fib with RVR recently cardioverted, acute and chronic diastolic heart failure, hypertension, GERD, and mental disability. The patient stated that at approximately 6 AM this morning she began to develop significant shortness of breath, with severe chest pain 8/10, and associated palpitations. The patient states that she had approximately 2  sips of orange juice at 8 AM this morning, but was unable to continue her breakfast due to nausea, and chest pain. The pain lasted for for several hours as aborted with 3 nitroglycerin tablets. She is given aspirin, morphine as well as oxygen via nasal cannula in route via EMS. She was recently discharged from [facility] for prolonged treatment of atrial fibrillation with rapid ventricular rate. She was treated with IV amiodarone was switched to by mouth amiodarone by cardiology and discharged on torsemide in lieu of furosemide. She was stable for 3 days following the second cardioversion with normalization of her heart rate and rhythm, blood pressure was greater than 100/70, saturating well above 95% on room air, and no acute distress without shortness of breath.   The patient denies fever,  chills, muscle aches, worsening abdominal pain, or cough. She attests to slightly improved shortness of breath, headache, chest pain that is different than her previous sternocostal chest pain, continued mild abdominal pain, tender lower distal extremities from the knees distally, and significant +3 pitting edema bilaterally the knees distally.   In the emergency department BMP indicated potassium at 3.5, worsening creatinine at 1.93, and GFR decreased at 3044 previous admission. CBC was stable hemoglobin 8.6 and her previous baseline. I-stat troponin 0.37.  Chest x-ray was performed demonstrating slightly worsened right-sided pleural effusion from her most recent chest x-ray. EKG was indicative of atrial fibrillation with heart rate ranging between 95-110. IMTS was called to admit the patient to the floor and Cardiology was consulted for further evaluation given the elevated troponin and Atrial fibrillation.    Meds:      Current Meds  Medication Sig  . amiodarone (PACERONE) 200 MG tablet Take 1 tablet (200 mg total) by mouth daily.  . citalopram (CELEXA) 20 MG tablet Take 20 mg by mouth daily.  . diclofenac sodium (VOLTAREN) 1 % GEL Apply 4 g topically 4 (four) times daily.  . famotidine (PEPCID) 20 MG tablet Take 1 tablet (20 mg total) by mouth 2 (two) times daily with a meal.  . metoprolol tartrate (LOPRESSOR) 25 MG tablet Take 1 tablet (25 mg total) by mouth 2 (two) times daily.  . pantoprazole (PROTONIX) 40 MG tablet Take 1 tablet (40 mg total) by mouth daily.  . rivaroxaban (XARELTO)   20 MG TABS tablet Take 20 mg by mouth daily.  . sucralfate (CARAFATE) 1 GM/10ML suspension Take 10 mLs (1 g total) by mouth 2 (two) times daily before a meal.  . torsemide (DEMADEX) 20 MG tablet Take 2 tablets (40 mg total) by mouth daily.  . traMADol (ULTRAM) 50 MG tablet Take 100 mg by mouth 2 (two) times daily.      Allergies:      Allergies as of 02/21/2017 - Review Complete 02/21/2017  Allergen  Reaction Noted  . Acetaminophen  04/18/2011       Past Medical History:  Diagnosis Date  . Acute on chronic diastolic CHF (congestive heart failure), NYHA class 4 (HCC) 02/22/2017  . Aortic insufficiency and aortic stenosis    s/p Tissue AVR with Dr. Owen 03/2011; preop LHC with normal cors;  pre op carotid dopplers neg for ICA stenosis  . Arthritis   . Atrial fibrillation (HCC)    post op after AVR 10/12  . HTN (hypertension)   . Mitral regurgitation   . Multiple blisters both lower legs 02/22/2017  . SVT (supraventricular tachycardia) (HCC)     Family History:       Family History  Problem Relation Age of Onset  . Heart attack Father   . Stroke Father   . Diabetes Father   . Colon cancer Sister        heavy smoker  . Diabetes Sister     Social History:      Social History  Substance Use Topics  . Smoking status: Never Smoker  . Smokeless tobacco: Never Used  . Alcohol use No    Review of Systems: A complete ROS was negative except as per HPI.   Physical Exam: Blood pressure 113/86, pulse (!) 134, temperature 98.7 F (37.1 C), temperature source Oral, resp. rate 19, height 5' 7" (1.702 m), weight 265 lb (120.2 kg), SpO2 95 %. Physical Exam  Constitutional: She appears well-developed and well-nourished. No distress.  HENT:  Head: Normocephalic and atraumatic.  Eyes: Conjunctivae and EOM are normal.  Neck: Normal range of motion. Neck supple.  Cardiovascular: An irregularly irregular rhythm present. Tachycardia present.   Murmur heard.  Systolic murmur is present with a grade of 3/6  Pulmonary/Chest: Effort normal. No respiratory distress. She has decreased breath sounds in the right lower field. She has wheezes.  Abdominal: Soft. Bowel sounds are normal. She exhibits no distension. There is no tenderness.  Musculoskeletal: She exhibits edema (3+ from the knees down) and tenderness (TTP distal from the knees).  Neurological: She is alert.    Skin: Skin is warm. Capillary refill takes less than 2 seconds.  Psychiatric: She has a normal mood and affect.   EKG: personally reviewed my interpretation is atrial fibrillation  CXR: personally reviewed my interpretation is right sided pulmonary effusion   Assessment & Plan by Problem: Active Problems:   Chronic diastolic congestive heart failure (HCC)   Sternocostal pain   Atrial fibrillation with RVR (HCC)   Chest pain  Danicia Weiss is a 66-year-old female who presented with a 5 hour history of sudden onset left-sided chest pain with associated palpitations, and shortness of breath. Given the patient's past medical history consistent for congestive heart failure, aortic valve replacement, atrial fibrillation with rapid ventricular rate, mitral regurgitation, and hypertension concern for acute coronary syndrome was highly differential. In conjunction with the patient's elevated troponin, she was admitted for evaluation for NSTEMI as there are no acute ST changes on repeat   EKG. Included in the differential were pulmonary embolism, pleuritic chest pain, worsening pleural effusion with pleuritis, pancreatitis, aortic dissection, tachycardia with secondary ischemia, and costovertebral pain. She was admitted for evaluation and treatment with consult placed to cardiology.  Chronic diastolic congestive heart failure: The patient has a known history of congestive heart failure She is currently on torsemide 40 mg daily at her facility. She patient presents with significant bilateral +3 pitting edema below was slightly above the knees distally. This was present on her prior admission but appears to have improved slightly since that time.  -BNP pending for a.m. -Discontinuing fluids -Will reduce torsemide to 20mg daily -Continue metoprolol tartrate 25mg BID -BMP for am  Chest Pain: The patient presented history of acute onset of chest pain differs from her sternocostal pain. -Initial  troponin elevated to 0.37 -Troponins trended for q6 hours x 3 -Cardiology consulted  -pain improved with nitro x 3 in route via EMS -EKG not significant for acute ST changes  Sternocostal pain and mid-epigastric pain: Sternocostal pain present but mild and not conerning to patient -Will monitor at this time Mid-epigastric pain much improved since her discharge, will continue famotidine and pantoprazole   Atrial fibrillation: Patient a lengthy history of A-rib. Was recently discharged following cardioversion.  Will discuss with cards the possibility for a more permanent solution if possible.  -Patient currently appears stable; vitals, HR wnml during eval, RR wnml, SpO2 wnml, BP 100/70 -Will continue amiodarone 200mg daily, metoprolol 25mg BID and await cards recs   Diet: NPO until Cards sees patient Fluids: None Dvt PPX:  Code: full Dispo: Admit patient to Inpatient with expected length of stay greater than 2 midnights.  Signed: Oleva Koo, MD 03/18/2017, 4:38 PM  Pager: Pager# [phone]  

## 2017-03-19 NOTE — Progress Notes (Signed)
The patient is having CP 5 out of 10, HR ~115, BP 87/50. EKG was taken MD Katrinka Blazing at bedside. Unable to give nitro due to soft BP. I will continue to monitor the patient closely.   Sheppard Evens RN

## 2017-03-19 NOTE — Anesthesia Procedure Notes (Signed)
Procedure Name: Intubation Date/Time: 04-12-2017 6:00 PM Performed by: Teressa Lower Pre-anesthesia Checklist: Patient identified, Emergency Drugs available, Suction available and Patient being monitored Patient Re-evaluated:Patient Re-evaluated prior to induction Oxygen Delivery Method: Circle system utilized Preoxygenation: Pre-oxygenation with 100% oxygen Induction Type: IV induction Ventilation: Mask ventilation without difficulty Laryngoscope Size: Mac and 3 Grade View: Grade I Tube type: Oral Tube size: 7.0 mm Number of attempts: 2 Airway Equipment and Method: Oral airway Placement Confirmation: ETT inserted through vocal cords under direct vision,  positive ETCO2 and breath sounds checked- equal and bilateral Secured at: 22 cm Tube secured with: Tape Dental Injury: Teeth and Oropharynx as per pre-operative assessment

## 2017-03-19 NOTE — Progress Notes (Signed)
Chaplain responded to Code via MD's in elevator.  Chaplain arrived at the room - code in progress.  Chaplain providing support for medical team.  Niece of patient arrived and was updated by physician.  Patient passed away while niece present.  Chaplain prayed with niece and nurse.  Patient left to have some private time with her Aunt.  Chaplain will remain available as needed.  Chaplain would like to thank the medical team for their care for this patient.    03/25/2017 1836  Clinical Encounter Type  Visited With Family;Health care provider  Visit Type Initial;Psychological support;Spiritual support;Social support;Code;Death;Critical Care  Referral From Physician  Consult/Referral To Chaplain

## 2017-03-19 NOTE — Progress Notes (Signed)
Code Blue called. Upon arrival into the room Dr. Johney Frame was running the code. After approximately 30-40 minutes. Family arrived in the room, elected to no longer pursue aggressive measures. Time of death called at 18:28. Attending notified. Not an ME case.

## 2017-03-19 NOTE — Progress Notes (Signed)
   Subjective: Patient was lying in bed today upon entering the room watching TV. She stated that she did not have a good night secondary to chest pain which is substernal,the sternocostal pain, abdominal pain, left leg pain, generalized discomfort. In addition the patient test to neck pain. She stated that although she did not wish to have the potassium IV line placed, if that pain was less severe than the abdominal pain secondary to the by mouth potassium she would tolerated.  Nurse paged to state that the patient's IV line had infiltrated. PICC line placed to continue the IV potassium and amiodarone the cardiology had ordered.  Objective:  Vital signs in last 24 hours: Vitals:   Mar 27, 2017 0336 03-27-17 0800 03-27-17 0915 03-27-2017 0933  BP: 96/82 100/86 105/83 (!) 87/50  Pulse:  (!) 101 (!) 106 (!) 115  Resp:  Temp: (!) 97.4 F (36.3 C)     TempSrc: Axillary     SpO2:  97% 98% 98%  Weight: 266 lb 12.8 oz (121 kg)     Height:       ROS negative except as per history of present illness.  Physical Exam Constitutional: Patient appears well-developed well-nourished in mild distress Cardiovascular: The patient is tachycardic, and atrial fibrillation approximately 110 to 1:15 during exam, marked grade 3 systolic ejection murmur in Pulmonary: Decreased effort, decreased respiratory sounds in the right lower lung, Abdomen soft, mildly distended, tender to palpation in the midepigastric and right upper quadrant Musculoskeletal: Bilateral+3 pitting edema from the knee to the ankle, slightly improved Neurological: alert and oriented today Skin: broken epidermis secondary to ruptured blisters over the left anterior tibial aspect directly below the knee resulting in multiple 3-5 cm lesions  Assessment/Plan:  Active Problems:   Chronic diastolic congestive heart failure (HCC)   Sternocostal pain   Atrial fibrillation with RVR (HCC)   Chest pain  Chronic diastolic congestive heart  failure: The patient has a known history of congestive heart failure She is currently on torsemide 40 mg daily at her facility. She patient presents with significant bilateral +3 pitting edema below was slightly above the knees distally. This was present on her prior admission but appears to have improved slightly since that time.  -BNP pending for a.m. -Discontinuing fluids -Cardiology discontinued torsemide in favor of furosemide  IV BID -Continue metoprolol tartrate  BID -BMP for am  Chest Pain: The patient presented history of acute onset of chest pain differs from her sternocostal pain. -Troponin elevation attributed to demand ischemia by cardiology -pain improved with nitro, ordered nitro 0.4 sublingual q6 PRN -EKG not significant for acute ST changes  Sternocostal pain and mid-epigastric pain: Sternocostal pain present but mild and not conerning to patient -Will monitor at this time Mid-epigastric pain much improved since her discharge, will continue famotidine and pantoprazole   Atrial fibrillation:  -Patient currently appears stable; vitals, HR wnml during eval, RR wnml, SpO2 wnml, BP 115/77 -Will continue amiodarone IV to reload as they do not think it reached sufficient levels previously  -metoprolol  BID -Cardiology consulted EP and CHF MD  Hyperbilirubinemia: -COOMBs ordred -smear ordered  -Fractionated bilirubin -US abdomin demonstrated cholelithiasis  -ERCP recommended, will consider this pending additional evaluation   Diet: NPO until Cards sees patient Fluids: None Dvt PPX:  Code: full    Dispo: Anticipated discharge in approximately 2+ day(s).   Lanelle Bal, MD March 27, 2017, 11:50 AM Pager: Pager# [phone]

## 2017-03-19 NOTE — Consult Note (Signed)
WOC Nurse wound consult note Reason for Consult: LE wounds Patient from SNF, she has had Unna's boots applied at the time of her last DC from [facility].  Wound type: ruptured blisters from edema and venous stasis Pressure Injury POA: NA Measurement: left medial: 5cm x 6cm x 0.1cm; left lateral 7cm x 2cm x 0.2cm  Wound ZOX:WRUE are clean, the lateral one has just a small amount of yellow along the distal edge Drainage (amount, consistency, odor) minimal, serosanguinous  Periwound: intact, edema  Dressing procedure/placement/frequency: Xeroform to the open areas  Unna's boots bilaterally, change 2x wk. WOC contacted orthopedic tech for placement.   Will need to have Unna's boot managed when back to SNF, change 2x wk.   Discussed POC with patient and bedside nurse.  Re consult if needed, will not follow at this time. Thanks  Babak Lucus [employer], RN,CWOCN, CNS, CWON-AP [phone])

## 2017-03-19 NOTE — Progress Notes (Addendum)
Progress Note  Patient Name: Jennifer Bauer Date of Encounter: 02/19/2017  Primary Cardiologist: Elease Hashimoto  Subjective   After review of records, no amiodarone with cardioversion performed in March 2018. Cardioversion in early September resulted in reversion to normal sinus rhythm for less than 5 minutes. Amiodarone then initiated and on 03/06/2017 cardioversion was successful. She returns now on oral amiodarone therapy with recurrent atrial fibrillation. Rate is poorly controlled. Blood pressures are relatively soft.   Having some discomfort. States this is always the case when she is in atrial fibrillation. The discomfort has been ongoing for several days prior to admission. She has been swelling and developing increasing shortness of breath over the past one to 2 weeks since discharge.  Inpatient Medications    Scheduled Meds: . amiodarone  200 mg Oral Daily  . aspirin  324 mg Oral Once  . citalopram  20 mg Oral Daily  . diclofenac sodium  4 g Topical QID  . famotidine  20 mg Oral BID WC  . metoprolol tartrate  25 mg Oral BID  . pantoprazole  40 mg Oral Daily  . rivaroxaban  20 mg Oral Q supper  . sucralfate  1 g Oral BID AC  . torsemide  20 mg Oral Daily  . traMADol  100 mg Oral BID   Continuous Infusions: . potassium chloride 10 mEq (02/19/2017 0802)   PRN Meds: nitroGLYCERIN, ondansetron (ZOFRAN) IV   Vital Signs    Vitals:   03/08/2017 0336 03/17/2017 0800 03/09/2017 0915 03/05/2017 0933  BP: 96/82 100/86 105/83 (!) 87/50  Pulse:  (!) 101 (!) 106 (!) 115  Resp:  Temp: (!) 97.4 F (36.3 C)     TempSrc: Axillary     SpO2:  97% 98% 98%  Weight: 266 lb 12.8 oz (121 kg)     Height:        Intake/Output Summary (Last 24 hours) at 02/27/2017 0943 Last data filed at 02/25/2017 0300  Gross per 24 hour  Intake              180 ml  Output              400 ml  Net             -220 ml   Filed Weights   Apr 07, 2017 1117 04-07-2017 2000 02/22/2017 0336  Weight: 265 lb (120.2  kg) 267 lb 3.2 oz (121.2 kg) 266 lb 12.8 oz (121 kg)    Telemetry    AF with RVR - Personally Reviewed  ECG    No new tracing - Personally Reviewed  Physical Exam  Morbidly obese. Somewhat pale. GEN: No acute distress.   Neck:  significant JVD Cardiac: IIRR, no murmurs, rubs, or gallops.  Respiratory: Clear to auscultation bilaterally. GI: Soft, nontender, non-distended  MS:  marked bilateral lower extremity edema. Neuro:  Nonfocal but slow to answer. Psych: Normal affect   Labs    Chemistry Recent Labs Lab 03/09/17 0235 04/07/17 1128 03/18/2017 0355  NA 137 136 137  K 3.7 3.5 3.4*  CL 97* 98* 97*  CO2 GLUCOSE 93 136* 134*  BUN 30* 32* 36*  CREATININE 1.43* 1.93* 2.01*  CALCIUM 8.9 8.8* 8.7*  PROT 6.1*  --  6.0*  ALBUMIN 3.0*  --  3.1*  AST 18  --  45*  ALT 18  --  37  ALKPHOS 81  --  82  BILITOT 1.8*  --  2.9*  GFRNONAA 38* 26* 25*  GFRAA 44* 30* 29*  ANIONGAP Hematology Recent Labs Lab 03/16/2017 1128 April 09, 2017 0355  WBC 5.8 6.4  RBC 4.10 4.09  HGB 8.6* 8.8*  HCT 29.7* 29.6*  MCV 72.4* 72.4*  MCH 21.0* 21.5*  MCHC 29.0* 29.7*  RDW 16.6* 17.3*  PLT 197 193    Cardiac Enzymes Recent Labs Lab 02/19/2017 1640 03/06/2017 2117 April 09, 2017 0355  TROPONINI 0.39* 0.39* 0.47*    Recent Labs Lab 02/20/2017 1136  TROPIPOC 0.37*     BNPNo results for input(s): BNP, PROBNP in the last 168 hours.   DDimer No results for input(s): DDIMER in the last 168 hours.   Radiology    Dg Chest 2 View  Result Date: 02/25/2017 CLINICAL DATA:  Shortness of breath and chest pain starting this morning EXAM: CHEST  2 VIEW COMPARISON:  March 01, 2017 FINDINGS: The heart size is enlarged. Patient status post prior median sternotomy. Mediastinal contour is unchanged. There is mild scar of the left mid lung. Stable calcified granuloma is identified in the left upper lobe unchanged. Mild patchy consolidation of right lung base with probable small right  pleural effusion are noted. The bony structures are stable. IMPRESSION: Mild patchy consolidation of right lung base with probable small right pleural effusion noted. Pneumonia is not excluded. Electronically Signed   By: Sherian Rein M.D.   On: 03/04/2017 12:15    Cardiac Studies   No new data. Last Echocardiogram 02/23/2017: Study Conclusions  - Left ventricle: The cavity size was normal. Wall thickness was   increased in a pattern of moderate LVH. Systolic function was   normal. The estimated ejection fraction was in the range of 55%   to 60%. Wall motion was normal; there were no regional wall   motion abnormalities. - Aortic valve: A bioprosthesis was present. There was moderate   stenosis. Valve area (VTI): 0.57 cm^2. Valve area (Vmax): 0.68   cm^2. Valve area (Vmean): 0.62 cm^2. - Mitral valve: Mildly calcified annulus. There was mild   regurgitation. - Left atrium: The atrium was moderately dilated. - Tricuspid valve: There was mild-moderate regurgitation. - Pulmonary arteries: Systolic pressure was moderately increased.   PA peak pressure: 55 mm Hg (S).    Patient Profile     66 y.o. female with PMH of HTN, AVR ('12) now with moderate AS, PAF (electrical cardioversion 3/18, 02/26/17, and 04-09-2017), Bradycardia, LE cellulitis, chronic diastolic HF who presented with chest pain and found to be in Afib.with RVR.  Assessment & Plan    1. Recurrent atrial fibrillation with poor rate control despite amiodarone although amiodarone therapy was initiated on 02/27/2017. Probably not fully saturated. We will reload with higher dose amiodarone and contemplate repeat electrical cardioversion before giving up on rhythm control  therapy. Will get opinion from electrophysiology to make sure that this is reasonable. 2. Acute on chronic diastolic heart failure with anasarca. More aggressive diuresis. 3. Acute on chronic kidney disease stage IV. Decompensation likely hemodynamic related to  development of atrial fibrillation and low flow. Diurese with caution. 4. Anemia aggravating overall clinical situation. 5. Hypoalbuminemia also contributing to third space fluid. 6. Sleep apnea likely 7. Elevated troponin is likely demand related ischemia in this clinical situation.  Complicated to situation. Will attempt better rate control with IV amiodarone, more aggressive diuresis, and probable repeat cardioversion after improved amiodarone loading. This may not occur for another 2 weeks. Whether adequate rate control and  diuresis can be achieved while still in atrial fibrillation is the clinical uncertainty.  For questions or updates, please contact [facility] Please consult [email or web address] for contact info under Cardiology/STEMI.      Signed, Lesleigh Noe, MD  04-10-2017, 9:43 AM

## 2017-03-19 NOTE — Death Summary Note (Signed)
  Name: Jennifer Bauer MRN: [id number] DOB: Oct 30, 1950 66 y.o.  Date of Admission: 03-27-17 11:04 AM Date of Discharge: 03/16/2017 Attending Physician: No att. providers found  Discharge Diagnosis: Active Problems:   Chronic diastolic congestive heart failure (HCC)   Sternocostal pain   Atrial fibrillation with RVR (HCC)   Chest pain  Cause of death:  Given the patient's history of congestive heart failure with poor perfusion and weak blood pressure, as well as atrial fibrillation with rapid ventricular rate, and recently elevated troponin's, it is our clinical judgement that the patient is now most likely deceased secondary to either a miocardial infarction, a fatal arrhythmia, or blood pressure not consistent with life secondary to worsening heart failure. Without the assistance of a formal autopsy a definitive diagnosis may not be ascertained.   Time of death: Code called at 1749 hours, resuscitation ended as per families request at 1828 hours. Time of death was called at 1828 hours.   Disposition and follow-up:   [name] was discharged from [facility] in expired condition.    Hospital Course: She was admitted on 2017/03/27 for chest pain and atrial fibrillation in rapid ventricular rate (A-fib w/ RVR). Her pain rapidly resolved in route via EMS with three nitroglycerin tablets. On arrival her troponin's were elevated, she was in A-fib w/ RVR and experiencing improved, but continued, chest pain. She informed the team that the pain was significantly different than her previous sternocostal pain There is no evidence of EKG changes, and her troponin's remained elevated but did not increase. Cardiology contributed the elevated troponin's to demand ischemia secondary to heart strain with the RVR. Cardiology planned to place the patient on an additional loading dose of amiodarone for rate control.  As the patient's IV line infiltrated in her right arm, the IV  team (nurse team) was called but were ultimately unable to place a peripheral line. A PICC line was requested to be placed for the IV amiodarone and furosemide. Cardiology was in agreement with awaiting for PICC line placement prior to initiating the IV medication, granted the patients heart rate did not increase to greater than 130-140's consistently. By 1746 hours, the patients nurse noted that she had become cyanotic, lips were blue and she was unresponsive despite the cardiac monitor indicating a potentially perfusing rhythm. Patients nurse rapidly initiated a code blue and a 40 minute attempt at resuscitation was made. The cardiology team under Dr. Johney Frame arrived prior to the IMTS code team due to proximity, and proceeded to run the code. Patients family was called to the room and decided to end the code after the 40 minute attempt. Family did not opt for an autopsy.   Signed: Lanelle Bal, MD 03/16/2017, 11:57 AM

## 2017-03-19 NOTE — Progress Notes (Signed)
Text paged Cardiology fellow re: Troponin 0.47, no C.P but HR in the 110's and Potassium 3.4, awaiting call back, no other s/s of distress/discomfort at this time. Pt denies H/A or nausea at this time.

## 2017-03-19 NOTE — H&P (Signed)
Date: 2017-04-11               Patient Name:  Jennifer Bauer MRN: [id number]  DOB: [birth year] Age / Sex: 66 y.o., female   PCP: Lonie Peak, PA-C         Medical Service: Internal Medicine Teaching Service         Attending Physician: Dr. Inez Catalina, MD    First Contact: Dr. Crista Elliot Pager: [phone]  Second Contact: Dr. Nelson Chimes Pager: [phone]       After Hours (After 5p/  First Contact Pager: [phone]  weekends / holidays): Second Contact Pager: [phone]   Chief Complaint: "chest pain and I can't breath"  History of Present Illness: Jennifer Bauer is a 66 year old female who presented with a 1 day history of chest pain, palpitations, shortness of breath and generalized fatigue. She's a past medical history of A. Fib with RVR recently cardioverted, acute and chronic diastolic heart failure, hypertension, GERD, and mental disability. The patient stated that at approximately 6 AM this morning she began to develop significant shortness of breath, with severe chest pain 8/10, and associated palpitations. The patient states that she had approximately 2  sips of orange juice at 8 AM this morning, but was unable to continue her breakfast due to nausea, and chest pain. The pain lasted for for several hours as aborted with 3 nitroglycerin tablets. She is given aspirin, morphine as well as oxygen via nasal cannula in route via EMS. She was recently discharged from [facility] for prolonged treatment of atrial fibrillation with rapid ventricular rate. She was treated with IV amiodarone was switched to by mouth amiodarone by cardiology and discharged on torsemide in lieu of furosemide. She was stable for 3 days following the second cardioversion with normalization of her heart rate and rhythm, blood pressure was greater than 100/70, saturating well above 95% on room air, and no acute distress without shortness of breath.   The patient denies fever,  chills, muscle aches, worsening abdominal pain, or cough. She attests to slightly improved shortness of breath, headache, chest pain that is different than her previous sternocostal chest pain, continued mild abdominal pain, tender lower distal extremities from the knees distally, and significant +3 pitting edema bilaterally the knees distally.   In the emergency department BMP indicated potassium at 3.5, worsening creatinine at 1.93, and GFR decreased at 3044 previous admission. CBC was stable hemoglobin 8.6 and her previous baseline. I-stat troponin 0.37.  Chest x-ray was performed demonstrating slightly worsened right-sided pleural effusion from her most recent chest x-ray. EKG was indicative of atrial fibrillation with heart rate ranging between 95-110. IMTS was called to admit the patient to the floor and Cardiology was consulted for further evaluation given the elevated troponin and Atrial fibrillation.    Meds:      Current Meds  Medication Sig  . amiodarone (PACERONE) 200 MG tablet Take 1 tablet (200 mg total) by mouth daily.  . citalopram (CELEXA) 20 MG tablet Take 20 mg by mouth daily.  . diclofenac sodium (VOLTAREN) 1 % GEL Apply 4 g topically 4 (four) times daily.  . famotidine (PEPCID) 20 MG tablet Take 1 tablet (20 mg total) by mouth 2 (two) times daily with a meal.  . metoprolol tartrate (LOPRESSOR) 25 MG tablet Take 1 tablet (25 mg total) by mouth 2 (two) times daily.  . pantoprazole (PROTONIX) 40 MG tablet Take 1 tablet (40 mg total) by mouth daily.  . rivaroxaban (XARELTO)  20 MG TABS tablet Take 20 mg by mouth daily.  . sucralfate (CARAFATE) 1 GM/10ML suspension Take 10 mLs (1 g total) by mouth 2 (two) times daily before a meal.  . torsemide (DEMADEX) 20 MG tablet Take 2 tablets (40 mg total) by mouth daily.  . traMADol (ULTRAM) 50 MG tablet Take 100 mg by mouth 2 (two) times daily.      Allergies:      Allergies as of 02/28/2017 - Review Complete 03/02/2017  Allergen  Reaction Noted  . Acetaminophen  04/18/2011       Past Medical History:  Diagnosis Date  . Acute on chronic diastolic CHF (congestive heart failure), NYHA class 4 (HCC) 02/22/2017  . Aortic insufficiency and aortic stenosis    s/p Tissue AVR with Dr. Cornelius Moras 03/2011; preop LHC with normal cors;  pre op carotid dopplers neg for ICA stenosis  . Arthritis   . Atrial fibrillation (HCC)    post op after AVR 10/12  . HTN (hypertension)   . Mitral regurgitation   . Multiple blisters both lower legs 02/22/2017  . SVT (supraventricular tachycardia) (HCC)     Family History:       Family History  Problem Relation Age of Onset  . Heart attack Father   . Stroke Father   . Diabetes Father   . Colon cancer Sister        heavy smoker  . Diabetes Sister     Social History:      Social History  Substance Use Topics  . Smoking status: Never Smoker  . Smokeless tobacco: Never Used  . Alcohol use No    Review of Systems: A complete ROS was negative except as per HPI.   Physical Exam: Blood pressure 113/86, pulse (!) 134, temperature 98.7 F (37.1 C), temperature source Oral, resp. rate 19, height  (1.702 m), weight 265 lb (120.2 kg), SpO2 95 %. Physical Exam  Constitutional: She appears well-developed and well-nourished. No distress.  HENT:  Head: Normocephalic and atraumatic.  Eyes: Conjunctivae and EOM are normal.  Neck: Normal range of motion. Neck supple.  Cardiovascular: An irregularly irregular rhythm present. Tachycardia present.   Murmur heard.  Systolic murmur is present with a grade of 3/6  Pulmonary/Chest: Effort normal. No respiratory distress. She has decreased breath sounds in the right lower field. She has wheezes.  Abdominal: Soft. Bowel sounds are normal. She exhibits no distension. There is no tenderness.  Musculoskeletal: She exhibits edema (3+ from the knees down) and tenderness (TTP distal from the knees).  Neurological: She is alert.    Skin: Skin is warm. Capillary refill takes less than 2 seconds.  Psychiatric: She has a normal mood and affect.   EKG: personally reviewed my interpretation is atrial fibrillation  CXR: personally reviewed my interpretation is right sided pulmonary effusion   Assessment & Plan by Problem: Active Problems:   Chronic diastolic congestive heart failure (HCC)   Sternocostal pain   Atrial fibrillation with RVR (HCC)   Chest pain  Jennifer Bauer is a 66 year old female who presented with a 5 hour history of sudden onset left-sided chest pain with associated palpitations, and shortness of breath. Given the patient's past medical history consistent for congestive heart failure, aortic valve replacement, atrial fibrillation with rapid ventricular rate, mitral regurgitation, and hypertension concern for acute coronary syndrome was highly differential. In conjunction with the patient's elevated troponin, she was admitted for evaluation for NSTEMI as there are no acute ST changes on repeat  EKG. Included in the differential were pulmonary embolism, pleuritic chest pain, worsening pleural effusion with pleuritis, pancreatitis, aortic dissection, tachycardia with secondary ischemia, and costovertebral pain. She was admitted for evaluation and treatment with consult placed to cardiology.  Chronic diastolic congestive heart failure: The patient has a known history of congestive heart failure She is currently on torsemide 40 mg daily at her facility. She patient presents with significant bilateral +3 pitting edema below was slightly above the knees distally. This was present on her prior admission but appears to have improved slightly since that time.  -BNP pending for a.m. -Discontinuing fluids -Will reduce torsemide to  daily -Continue metoprolol tartrate  BID -BMP for am  Chest Pain: The patient presented history of acute onset of chest pain differs from her sternocostal pain. -Initial  troponin elevated to 0.37 -Troponins trended for q6 hours x 3 -Cardiology consulted  -pain improved with nitro x 3 in route via EMS -EKG not significant for acute ST changes  Sternocostal pain and mid-epigastric pain: Sternocostal pain present but mild and not conerning to patient -Will monitor at this time Mid-epigastric pain much improved since her discharge, will continue famotidine and pantoprazole   Atrial fibrillation: Patient a lengthy history of A-rib. Was recently discharged following cardioversion.  Will discuss with cards the possibility for a more permanent solution if possible.  -Patient currently appears stable; vitals, HR wnml during eval, RR wnml, SpO2 wnml, BP 100/70 -Will continue amiodarone  daily, metoprolol  BID and await cards recs   Diet: NPO until Cards sees patient Fluids: None Dvt PPX:  Code: full Dispo: Admit patient to Inpatient with expected length of stay greater than 2 midnights.  Signed: Lanelle Bal, MD 02/22/2017, 4:38 PM  Pager: Pager# [phone]

## 2017-03-19 NOTE — Progress Notes (Signed)
The patient was found with cyanotic lips and unresponsive at ~1746. Call blue was indicated and assisted ventilation started at 1749. Resuscitation ended at 1828.   Sheppard Evens RN

## 2017-03-19 DEATH — deceased

## 2018-06-30 IMAGING — DX DG CHEST 1V PORT
1 series · 1 of 1 positions shown · non-contrast
Comparison: 02/22/2017 CXR

CLINICAL DATA: Dyspnea today

EXAM:
PORTABLE CHEST 1 VIEW

[chest ap]
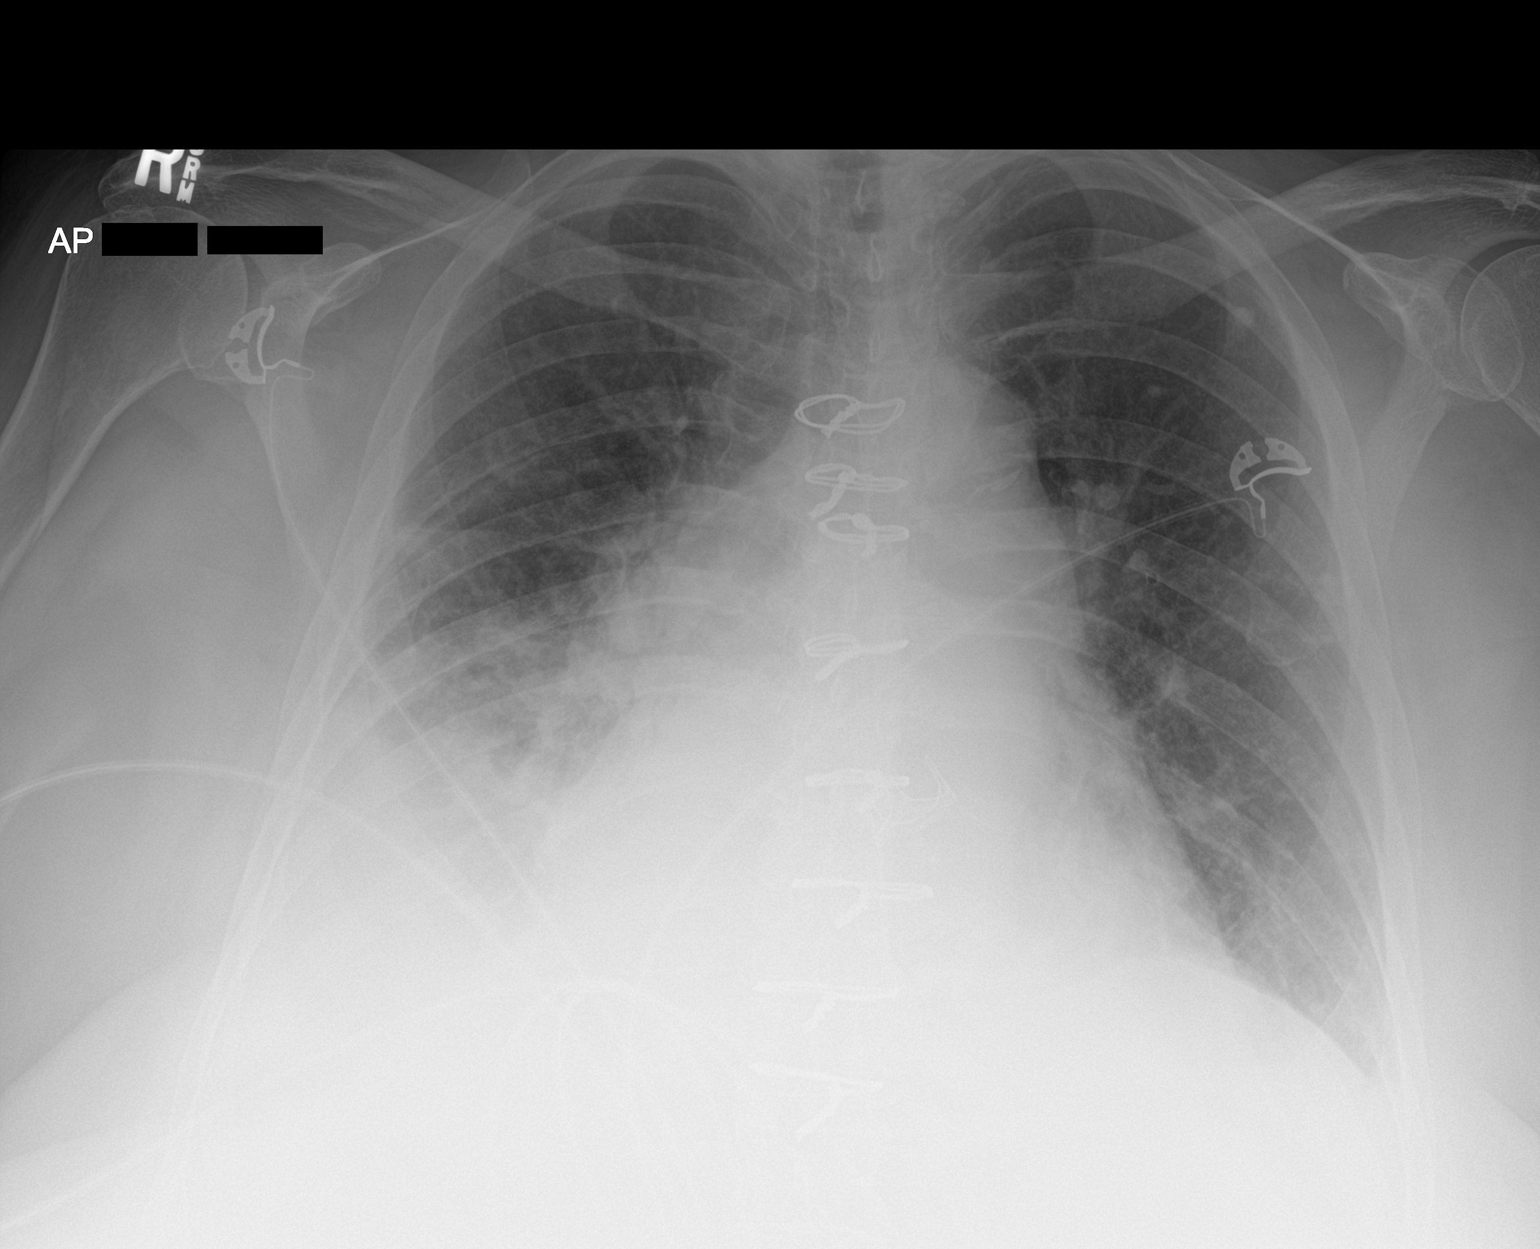

[1 of 1 positions shown; findings below may reference images not displayed]

FINDINGS: Chronic stable cardiomegaly with aortic valvular replacement and
aortic atherosclerosis. Confluent airspace opacity in the right
lower lobe raise concern for brain pneumonic consolidation and/or
pleural effusion. Mild vascular congestion is noted with calcified
granuloma in the left upper lobe.
IMPRESSION: 1. Stable cardiomegaly with aortic atherosclerosis. Status post
aortic valvular replacement.
2. New confluent opacity at the right lung base obscuring the right
hemidiaphragm and portions of the right heart border. Findings
likely reflect atelectasis and/or pneumonia. Superimposed effusion
is likewise not excluded.
3. Mild vascular congestion.
4. Left upper lobe calcified nodule consistent with a granuloma.

## 2018-07-15 IMAGING — CR DG CHEST 2V
2 series · 2 of 2 positions shown · non-contrast
Comparison: March 01, 2017

CLINICAL DATA: Shortness of breath and chest pain starting this
morning

EXAM:
CHEST  2 VIEW

[chest lat]
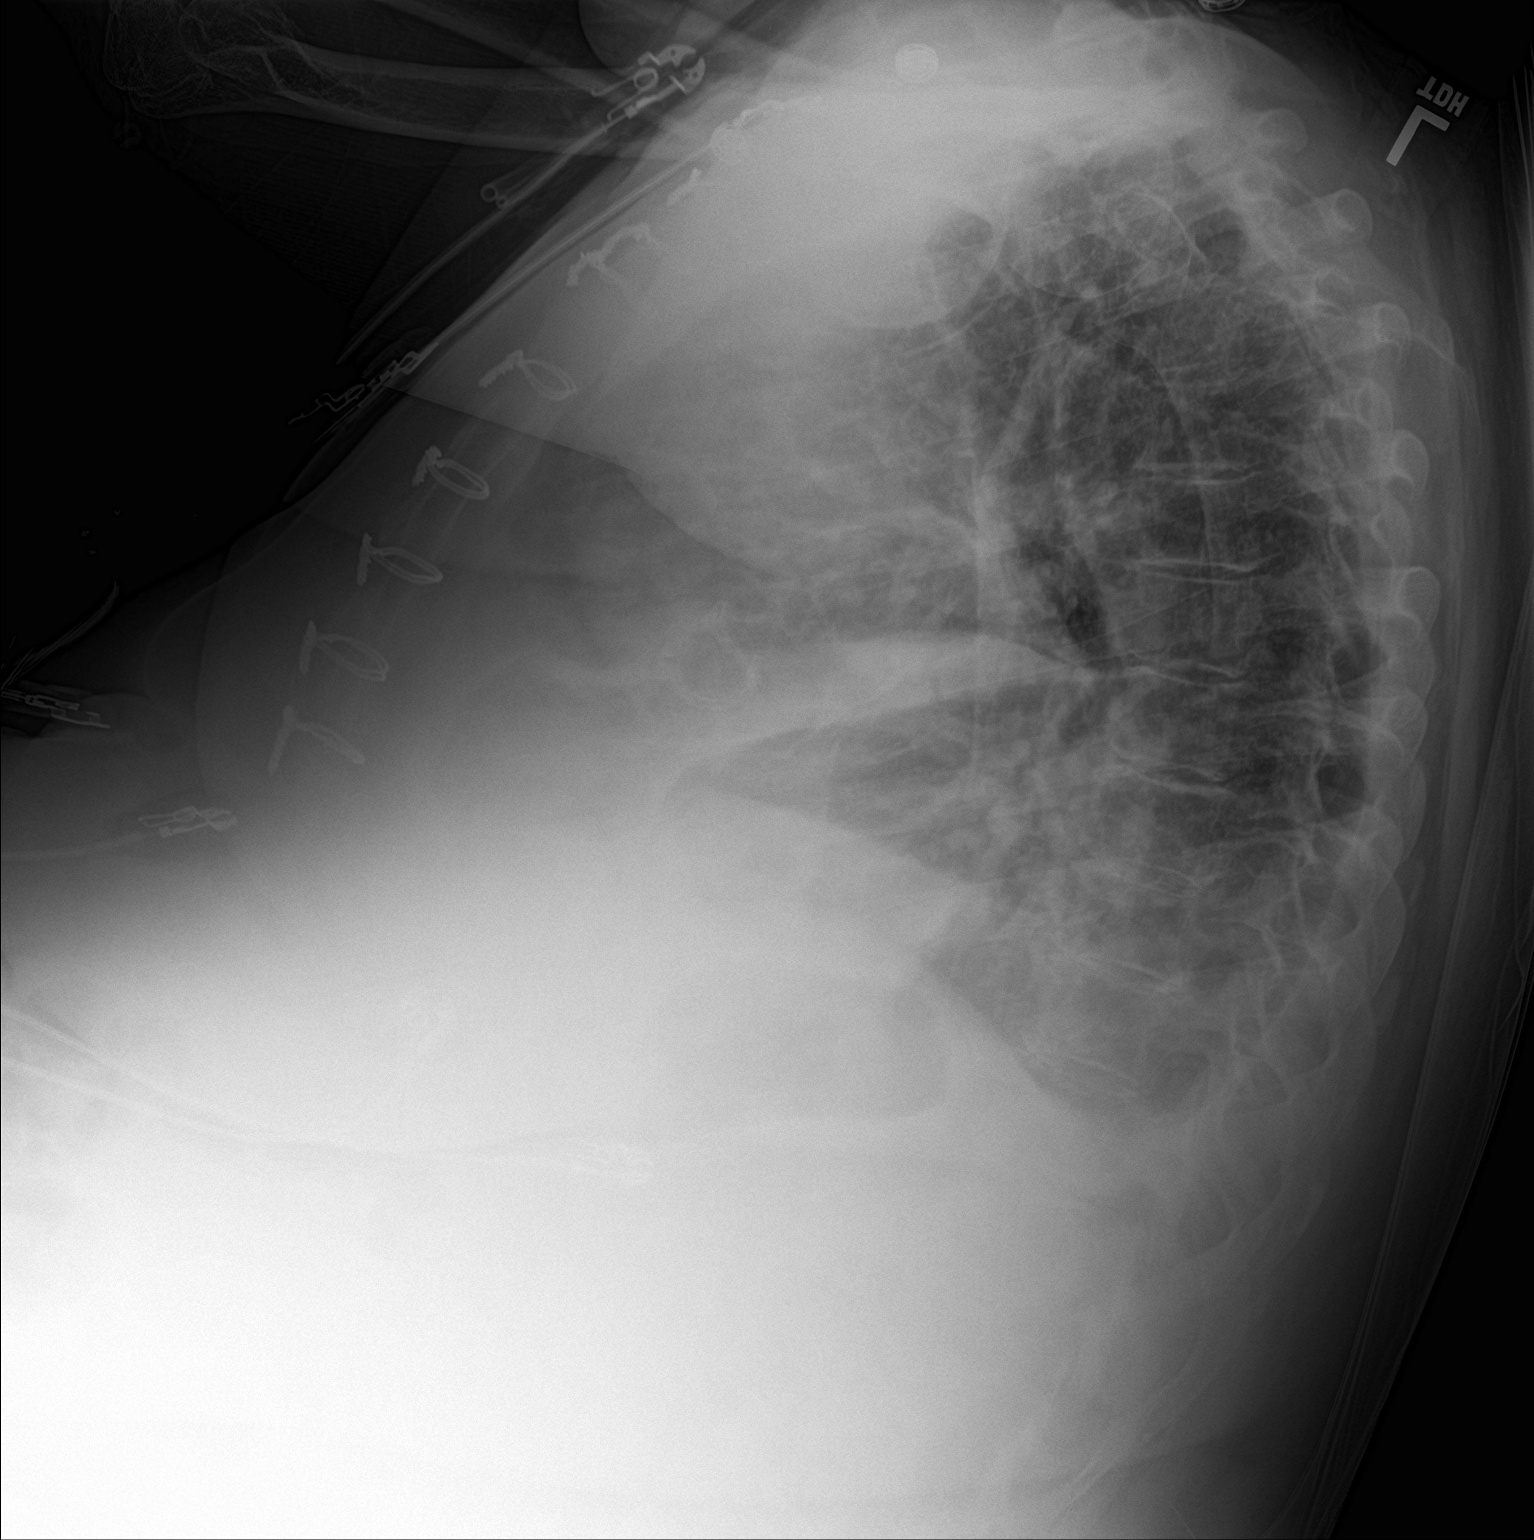

[chest ap]
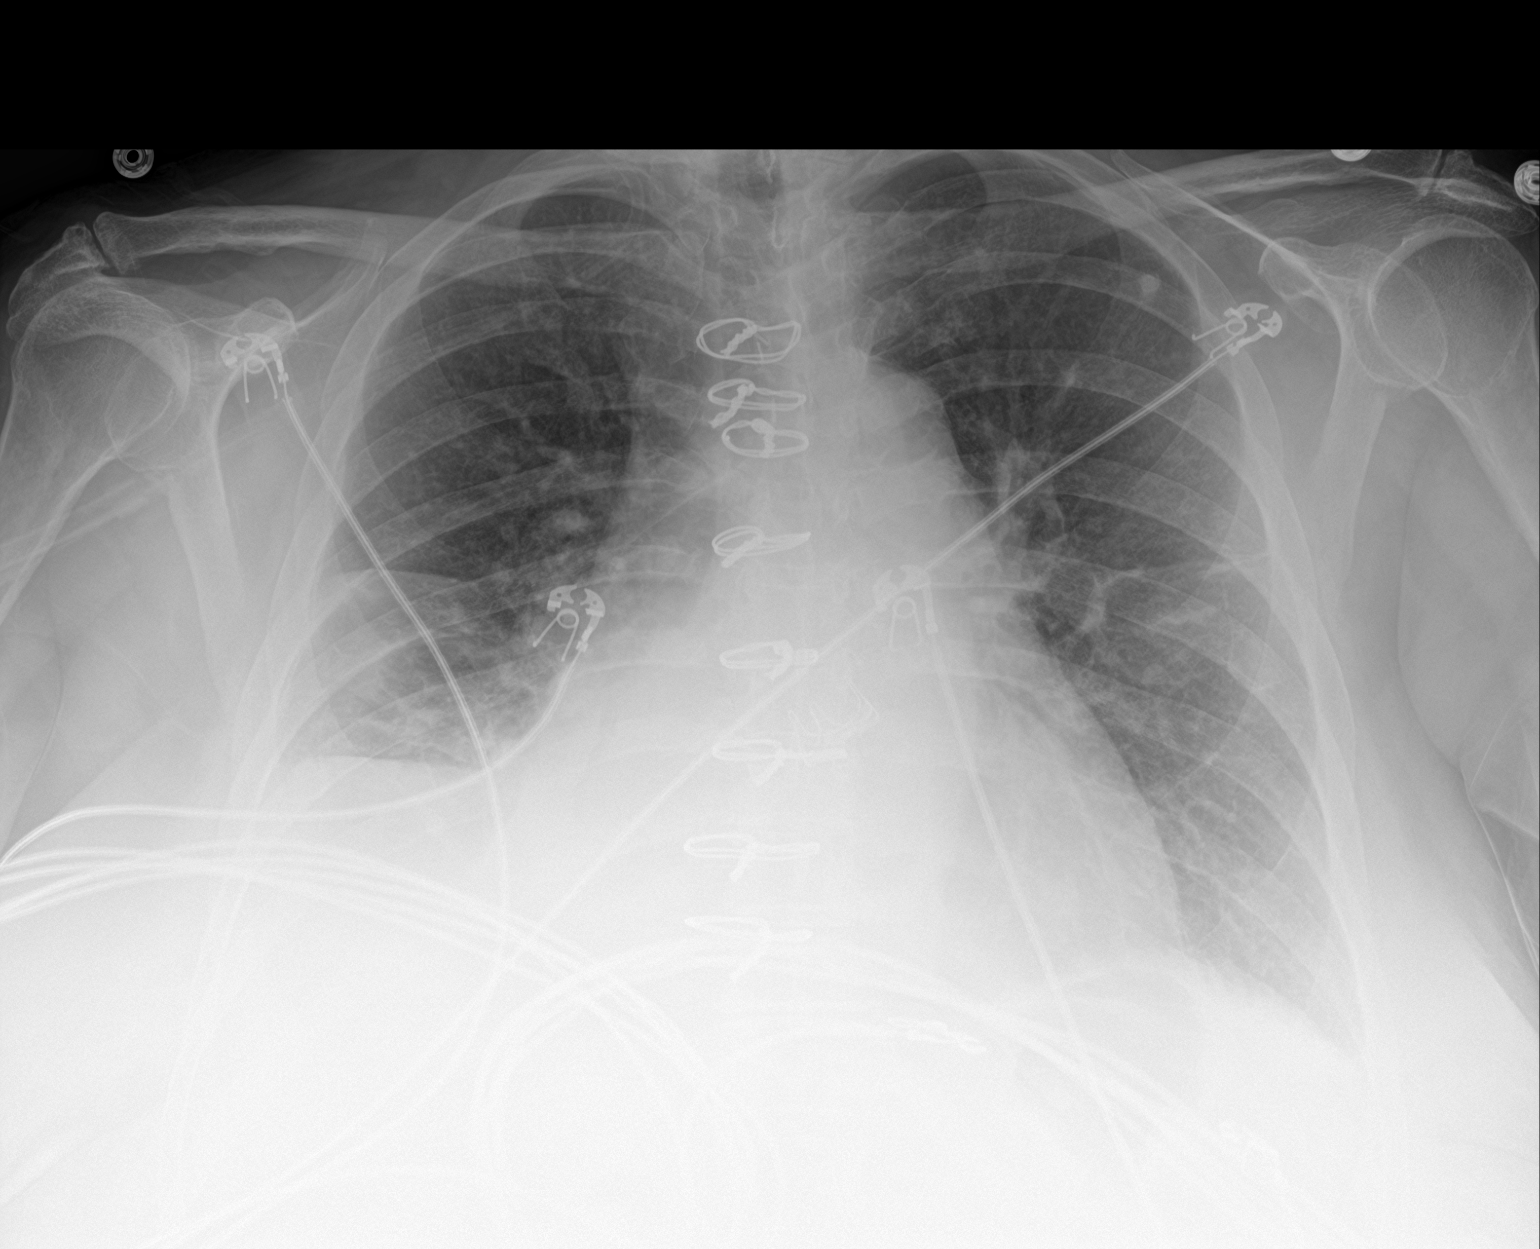

[2 of 2 positions shown; findings below may reference images not displayed]

FINDINGS: The heart size is enlarged. Patient status post prior median
sternotomy. Mediastinal contour is unchanged. There is mild scar of
the left mid lung. Stable calcified granuloma is identified in the
left upper lobe unchanged. Mild patchy consolidation of right lung
base with probable small right pleural effusion are noted. The bony
structures are stable.
IMPRESSION: Mild patchy consolidation of right lung base with probable small
right pleural effusion noted. Pneumonia is not excluded.
